# Patient Record
Sex: Female | Born: 1955
Health system: Southern US, Community
[De-identification: ages and names within clinical notes are randomized; demographics above are authoritative.]

## PROBLEM LIST (undated history)

## (undated) DIAGNOSIS — I10 Essential (primary) hypertension: Secondary | ICD-10-CM

## (undated) DIAGNOSIS — G2581 Restless legs syndrome: Secondary | ICD-10-CM

## (undated) DIAGNOSIS — I499 Cardiac arrhythmia, unspecified: Secondary | ICD-10-CM

## (undated) DIAGNOSIS — T7840XA Allergy, unspecified, initial encounter: Secondary | ICD-10-CM

## (undated) DIAGNOSIS — J45909 Unspecified asthma, uncomplicated: Secondary | ICD-10-CM

## (undated) DIAGNOSIS — M1990 Unspecified osteoarthritis, unspecified site: Secondary | ICD-10-CM

## (undated) DIAGNOSIS — E785 Hyperlipidemia, unspecified: Secondary | ICD-10-CM

## (undated) HISTORY — DX: Unspecified osteoarthritis, unspecified site: M19.90

## (undated) HISTORY — DX: Cardiac arrhythmia, unspecified: I49.9

## (undated) HISTORY — DX: Allergy, unspecified, initial encounter: T78.40XA

## (undated) HISTORY — DX: Essential (primary) hypertension: I10

---
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
## 1980-11-13 HISTORY — PX: APPENDECTOMY: SHX54

## 1980-11-13 HISTORY — PX: CHOLECYSTECTOMY: SHX55

## 1990-11-13 HISTORY — PX: TUBAL LIGATION: SHX77

## 2001-11-13 HISTORY — PX: ABDOMINAL HYSTERECTOMY: SHX81

## 2001-11-13 HISTORY — PX: INCONTINENCE SURGERY: SHX676

## 2013-04-10 ENCOUNTER — Emergency Department
Admission: EM | Admit: 2013-04-10 | Discharge: 2013-04-10 | Disposition: A | Discharge status: Home / Self Care | Payer: No Typology Code available for payment source | Source: Home / Self Care | Attending: Family Medicine | Admitting: Family Medicine

## 2013-04-10 ENCOUNTER — Emergency Department (INDEPENDENT_AMBULATORY_CARE_PROVIDER_SITE_OTHER): Payer: No Typology Code available for payment source

## 2013-04-10 ENCOUNTER — Encounter: Payer: Self-pay | Admitting: *Deleted

## 2013-04-10 DIAGNOSIS — M7652 Patellar tendinitis, left knee: Secondary | ICD-10-CM

## 2013-04-10 DIAGNOSIS — M7650 Patellar tendinitis, unspecified knee: Secondary | ICD-10-CM

## 2013-04-10 DIAGNOSIS — M25569 Pain in unspecified knee: Secondary | ICD-10-CM

## 2013-04-10 HISTORY — DX: Unspecified asthma, uncomplicated: J45.909

## 2013-04-10 HISTORY — DX: Restless legs syndrome: G25.81

## 2013-04-10 HISTORY — DX: Hyperlipidemia, unspecified: E78.5

## 2013-04-10 NOTE — ED Notes (Signed)
Pt states hinged knee sleeve feels much better.

## 2013-04-10 NOTE — ED Provider Notes (Signed)
History     CSN: [id number]  Arrival date & time 04/10/13  1645   First MD Initiated Contact with Patient 04/10/13 1718      Chief Complaint  Patient presents with  . Knee Pain       HPI Comments: While doing step-up exercises at the gym at [facility] yesterday patient had sudden pain in her left knee anteriorly.  Later she had mild swelling, and has had persistent pain localized beneath her patella with walking and weight bearing.  Patient is a 57 y.o. female presenting with knee pain. The history is provided by the patient.  Knee Pain Location:  Knee Time since incident:  1 day Injury: yes   Mechanism of injury comment:  Exercising in gym Knee location:  L knee Pain details:    Quality:  Aching   Radiates to:  Does not radiate   Severity:  Mild   Onset quality:  Sudden   Duration:  1 day   Timing:  Constant   Progression:  Unchanged Chronicity:  New Dislocation: no   Prior injury to area:  No Relieved by:  Ice Worsened by:  Nothing tried Ineffective treatments:  Compression Associated symptoms: stiffness and swelling   Associated symptoms: no back pain, no decreased ROM, no muscle weakness, no numbness and no tingling     Past Medical History  Diagnosis Date  . Allergy-induced asthma   . Hyperlipidemia   . RLS (restless legs syndrome)     Past Surgical History  Procedure Laterality Date  . Cholecystectomy    . Appendectomy    . Abdominal hysterectomy      Family History  Problem Relation Age of Onset  . Cancer Mother     lung  . Hypertension Father   . Heart attack Father     History  Substance Use Topics  . Smoking status: Former Games developer  . Smokeless tobacco: Never Used  . Alcohol Use: Yes    OB History   Grav Para Term Preterm Abortions TAB SAB Ect Mult Living                  Review of Systems  Musculoskeletal: Positive for stiffness. Negative for back pain.  All other systems reviewed and are negative.    Allergies  Review of patient's  allergies indicates no known allergies.  Home Medications   Current Outpatient Rx  Name  Route  Sig  Dispense  Refill  . calcium & magnesium carbonates (MYLANTA) 253-664 MG per tablet   Oral   Take 1 tablet by mouth daily.         . cetirizine (ZYRTEC) 10 MG tablet   Oral   Take 10 mg by mouth daily.         . folic acid (FOLVITE) 1 MG tablet   Oral   Take 1 mg by mouth daily.         Marland Kitchen glucosamine-chondroitin 500-400 MG tablet   Oral   Take 1 tablet by mouth 3 (three) times daily.         . Misc Natural Products (DEEP SLEEP PO)   Oral   Take by mouth.         . montelukast (SINGULAIR) 10 MG tablet   Oral   Take 10 mg by mouth at bedtime.         Marland Kitchen rOPINIRole (REQUIP) 1 MG tablet   Oral   Take 1 mg by mouth 3 (three) times daily.         Marland Kitchen  simvastatin (ZOCOR) 40 MG tablet   Oral   Take 40 mg by mouth every evening.         . valACYclovir (VALTREX) 500 MG tablet   Oral   Take 500 mg by mouth 2 (two) times daily.           BP 132/76  Pulse 62  Resp 14  Ht 5\' 2"  (1.575 m)  Wt 155 lb (70.308 kg)  BMI 28.34 kg/m2  SpO2 96%  Physical Exam  Nursing note and vitals reviewed. Constitutional: She is oriented to person, place, and time. She appears well-developed and well-nourished. No distress.  Eyes: Conjunctivae are normal. Pupils are equal, round, and reactive to light.  Musculoskeletal: Normal range of motion. She exhibits tenderness.       Left knee: She exhibits normal range of motion, no swelling, no effusion, no ecchymosis, no deformity, no laceration, no erythema, normal alignment, no LCL laxity, normal patellar mobility, no bony tenderness, normal meniscus and no MCL laxity. Tenderness found. Patellar tendon tenderness noted. No medial joint line, no lateral joint line, no MCL and no LCL tenderness noted.  Left knee has full range of motion.  Patient localizes her pain beneath inferior pole of patella, but there is minimal tenderness to  palpation over patellar tendon.  McMurray test negative.  Knee stable; negative drawer test.  Distal neurovascular function is intact.   Neurological: She is oriented to person, place, and time.  Skin: Skin is warm and dry. No erythema.    ED Course  Procedures  none   Dg Knee Complete 4 Views Left  04/10/2013   *RADIOLOGY REPORT*  Clinical Data: Anterior left knee pain after injury during exercise.  LEFT KNEE - COMPLETE 4+ VIEW  Comparison: None.  Findings: No definite knee effusion.  Subtle subcortical lucency medially along the medial tibial plateau on the frontal view only is thought to likely be incidental.  No knee effusion or fracture identified.  IMPRESSION:  1.  No definite acute bony findings.  If symptoms persist despite conservative therapy, MRI followup may be warranted.   Original Report Authenticated By: Gaylyn Rong, M.D.     1. Patellar tendonitis, left       MDM  Knee brace applied. Apply ice pack for about 30 minutes, three or four times daily.  Continue until improved.  Begin Ibuprofen 200mg , 4 tabs every 8 hours with food.  Begin stretching and range of motion exercises ([facility] information and instruction handout given)  Wear knee brace. Followup with [facility] if not improving about two weeks.         Lattie Haw, MD 04/13/13 [phone]

## 2013-04-10 NOTE — ED Notes (Signed)
Heidi Mclaughlin c/o left knee pain after twisting it at the gym yesterday. No previous injury.

## 2013-04-22 DIAGNOSIS — Z9889 Other specified postprocedural states: Secondary | ICD-10-CM | POA: Insufficient documentation

## 2013-04-24 ENCOUNTER — Ambulatory Visit: Payer: No Typology Code available for payment source | Admitting: Sports Medicine

## 2013-04-24 ENCOUNTER — Encounter: Payer: Self-pay | Admitting: Sports Medicine

## 2013-04-24 ENCOUNTER — Ambulatory Visit (INDEPENDENT_AMBULATORY_CARE_PROVIDER_SITE_OTHER): Payer: No Typology Code available for payment source | Admitting: Sports Medicine

## 2013-04-24 VITALS — BP 138/84 | HR 67 | Wt 153.0 lb

## 2013-04-24 DIAGNOSIS — J4520 Mild intermittent asthma, uncomplicated: Secondary | ICD-10-CM

## 2013-04-24 DIAGNOSIS — Z299 Encounter for prophylactic measures, unspecified: Secondary | ICD-10-CM

## 2013-04-24 DIAGNOSIS — M1710 Unilateral primary osteoarthritis, unspecified knee: Secondary | ICD-10-CM | POA: Insufficient documentation

## 2013-04-24 DIAGNOSIS — M25562 Pain in left knee: Secondary | ICD-10-CM

## 2013-04-24 DIAGNOSIS — M25569 Pain in unspecified knee: Secondary | ICD-10-CM

## 2013-04-24 DIAGNOSIS — J45909 Unspecified asthma, uncomplicated: Secondary | ICD-10-CM

## 2013-04-24 DIAGNOSIS — E785 Hyperlipidemia, unspecified: Secondary | ICD-10-CM

## 2013-04-24 DIAGNOSIS — M1712 Unilateral primary osteoarthritis, left knee: Secondary | ICD-10-CM | POA: Insufficient documentation

## 2013-04-24 DIAGNOSIS — J302 Other seasonal allergic rhinitis: Secondary | ICD-10-CM | POA: Insufficient documentation

## 2013-04-24 DIAGNOSIS — Z0000 Encounter for general adult medical examination without abnormal findings: Secondary | ICD-10-CM | POA: Insufficient documentation

## 2013-04-24 DIAGNOSIS — G2581 Restless legs syndrome: Secondary | ICD-10-CM | POA: Insufficient documentation

## 2013-04-24 MED ORDER — MELOXICAM 15 MG PO TABS: ORAL_TABLET | ORAL | Status: DC

## 2013-04-24 NOTE — Patient Instructions (Addendum)
Hip Rehabilitation Protocol:  1.  Side leg raises.  3x30 with no weight, then 3x15 with 2 lb ankle weight, then 3x15 with 5 lb ankle weight 2.  Standing hip rotation.  3x30 with no weight, then 3x15 with 2 lb ankle weight, then 3x15 with 5 lb ankle weight. 3.  Side step ups.  3x30 with no weight, then 3x15 with 5 lbs in backpack, then 3x15 with 10 lbs in backpack. 

## 2013-04-24 NOTE — Assessment & Plan Note (Signed)
Continue Singulair, Zyrtec 

## 2013-04-24 NOTE — Assessment & Plan Note (Signed)
Checking routine blood work. Colonoscopy was at age 57, mammogram was September 2013, Pap smear was October of 2013.

## 2013-04-24 NOTE — Assessment & Plan Note (Signed)
Home exercises. Mobic. Hip abductor rehabilitation. Return in 4 weeks, injection if no better.

## 2013-04-24 NOTE — Assessment & Plan Note (Signed)
Rechecking lipids. Continue simvastatin.

## 2013-04-24 NOTE — Progress Notes (Signed)
   Subjective:    I'm seeing this patient as a consultation for:  Dr. Cathren Harsh  CC: Left knee pain, establish care.  HPI: Knee pain: About 2-3 weeks ago, was doing lunges, felt a pop in the anterior aspect of knee and developed immediate pain but no swelling, no bruising. Since then she's had pain that she localizes over the proximal aspect of her patella, without radiation, denies any locking or buckling. She does get occasional bright and does note pain is particularly bad in getting up out of a chair and going up and down stairs. Pain is moderate, stable. She was seen in urgent care and referred to me for definitive evaluation and treatment. Pain is not much better since evaluation in urgent care.  Preventative measures: See below, up-to-date on most measures. Due for some blood work.  Past medical history, Surgical history, Family history not pertinant except as noted below, Social history, Allergies, and medications have been entered into the medical record, reviewed, and no changes needed.   Review of Systems: No headache, visual changes, nausea, vomiting, diarrhea, constipation, dizziness, abdominal pain, skin rash, fevers, chills, night sweats, weight loss, swollen lymph nodes, body aches, joint swelling, muscle aches, chest pain, shortness of breath, mood changes, visual or auditory hallucinations.   Objective:   General: Well Developed, well nourished, and in no acute distress.  Neuro/Psych: Alert and oriented x3, extra-ocular muscles intact, able to move all 4 extremities, sensation grossly intact. Skin: Warm and dry, no rashes noted.  Respiratory: Not using accessory muscles, speaking in full sentences, trachea midline.  Cardiovascular: Pulses palpable, no extremity edema. Abdomen: Does not appear distended. Left Knee: Normal to inspection with no erythema or effusion or obvious bony abnormalities. Palpation normal with no warmth, joint line tenderness, patellar tenderness, or  condyle tenderness. She does have a palpable plica lateral to the patella with a non-painful pop as we take it through the range of motion. ROM full in flexion and extension and lower leg rotation. Ligaments with solid consistent endpoints including ACL, PCL, LCL, MCL. Negative Mcmurray's, Apley's, and Thessalonian tests. Non painful patellar compression. Patellar glide without crepitus. Patellar and quadriceps tendons unremarkable. Hamstring and quadriceps strength is normal.   X-rays reviewed and showed no sign of fracture, dislocation, or degenerative change.  Impression and Recommendations:   This case required medical decision making of moderate complexity.

## 2013-04-24 NOTE — Assessment & Plan Note (Signed)
Controlled with [employer].

## 2013-04-25 ENCOUNTER — Ambulatory Visit (INDEPENDENT_AMBULATORY_CARE_PROVIDER_SITE_OTHER): Payer: No Typology Code available for payment source | Admitting: Sports Medicine

## 2013-04-25 ENCOUNTER — Encounter: Payer: Self-pay | Admitting: Sports Medicine

## 2013-04-25 VITALS — BP 118/77 | HR 71

## 2013-04-25 DIAGNOSIS — M25562 Pain in left knee: Secondary | ICD-10-CM

## 2013-04-25 DIAGNOSIS — M25569 Pain in unspecified knee: Secondary | ICD-10-CM

## 2013-04-25 NOTE — Assessment & Plan Note (Signed)
I saw this pleasant 57 year old female recently for patellofemoral pain.  Unfortunately she called in extreme pain, but she localized under the kneecap.  Meloxicam was ineffective, we injected as above. Return in 4 weeks.

## 2013-04-25 NOTE — Progress Notes (Signed)
  Subjective:    CC: Followup  HPI: Left knee pain: Diagnosed with patellofemoral pain syndrome at the last visit, a few days ago. Unfortunately had a recent flare of her pain, no injury, no trauma, no swelling, no mechanical symptoms. Pain is localized in the patella, moderate to severe but much better than yesterday, stable.  Past medical history, Surgical history, Family history not pertinant except as noted below, Social history, Allergies, and medications have been entered into the medical record, reviewed, and no changes needed.   Review of Systems: No fevers, chills, night sweats, weight loss, chest pain, or shortness of breath.   Objective:    General: Well Developed, well nourished, and in no acute distress.  Neuro: Alert and oriented x3, extra-ocular muscles intact, sensation grossly intact.  HEENT: Normocephalic, atraumatic, pupils equal round reactive to light, neck supple, no masses, no lymphadenopathy, thyroid nonpalpable.  Skin: Warm and dry, no rashes. Cardiac: Regular rate and rhythm, no murmurs rubs or gallops, no lower extremity edema.  Respiratory: Clear to auscultation bilaterally. Not using accessory muscles, speaking in full sentences. Left Knee: Normal to inspection with no erythema or effusion or obvious bony abnormalities. Palpation normal with no warmth, joint line tenderness, patellar tenderness, or condyle tenderness. ROM full in flexion and extension and lower leg rotation. Ligaments with solid consistent endpoints including ACL, PCL, LCL, MCL. Negative Mcmurray's, Apley's, and Thessalonian tests. Non painful patellar compression. Patellar glide without crepitus. Patellar and quadriceps tendons unremarkable. Hamstring and quadriceps strength is normal.   Procedure: Real-time Ultrasound Guided Injection of left knee Device: GE Logiq E  Verbal informed consent obtained.  Time-out conducted.  Noted no overlying erythema, induration, or other signs of local  infection.  Skin prepped in a sterile fashion.  Local anesthesia: Topical Ethyl chloride.  With sterile technique and under real time ultrasound guidance:  2 cc kenalog 40, 4 cc lidocaine injected into suprapatellar recess. Completed without difficulty  Pain immediately resolved suggesting accurate placement of the medication.  Advised to call if fevers/chills, erythema, induration, drainage, or persistent bleeding.  Images permanently stored and available for review in the ultrasound unit.  Impression: Technically successful ultrasound guided injection.  Impression and Recommendations:

## 2013-04-29 ENCOUNTER — Ambulatory Visit: Payer: No Typology Code available for payment source | Admitting: Sports Medicine

## 2013-05-05 ENCOUNTER — Encounter: Payer: Self-pay | Admitting: Sports Medicine

## 2013-05-05 DIAGNOSIS — Z299 Encounter for prophylactic measures, unspecified: Secondary | ICD-10-CM

## 2013-05-05 NOTE — Progress Notes (Signed)
Records received, reviewed, and necessary changes to chart made.  

## 2013-05-23 ENCOUNTER — Ambulatory Visit (INDEPENDENT_AMBULATORY_CARE_PROVIDER_SITE_OTHER): Payer: No Typology Code available for payment source | Admitting: Sports Medicine

## 2013-05-23 ENCOUNTER — Encounter: Payer: Self-pay | Admitting: Sports Medicine

## 2013-05-23 VITALS — BP 127/84 | HR 67 | Wt 153.0 lb

## 2013-05-23 DIAGNOSIS — M25562 Pain in left knee: Secondary | ICD-10-CM

## 2013-05-23 DIAGNOSIS — E785 Hyperlipidemia, unspecified: Secondary | ICD-10-CM

## 2013-05-23 DIAGNOSIS — Z299 Encounter for prophylactic measures, unspecified: Secondary | ICD-10-CM

## 2013-05-23 DIAGNOSIS — G2581 Restless legs syndrome: Secondary | ICD-10-CM

## 2013-05-23 NOTE — Assessment & Plan Note (Signed)
Pain resolved after single intra-articular injection. Continue knee brace as needed, continue home exercises. Return on an as-needed basis for this.

## 2013-05-23 NOTE — Progress Notes (Signed)
  Subjective:    CC: Followup  HPI: Patellofemoral chondromalacia: Pain is 100% resolved after injection, she continues to use her brace, is very happy with the results. She continues to do home exercises.  Restless leg syndrome: Stable.  Hyperlipidemia: Stable.  Past medical history, Surgical history, Family history not pertinant except as noted below, Social history, Allergies, and medications have been entered into the medical record, reviewed, and no changes needed.   Review of Systems: No fevers, chills, night sweats, weight loss, chest pain, or shortness of breath.   Objective:    General: Well Developed, well nourished, and in no acute distress.  Neuro: Alert and oriented x3, extra-ocular muscles intact, sensation grossly intact.  HEENT: Normocephalic, atraumatic, pupils equal round reactive to light, neck supple, no masses, no lymphadenopathy, thyroid nonpalpable.  Skin: Warm and dry, no rashes. Cardiac: Regular rate and rhythm, no murmurs rubs or gallops, no lower extremity edema.  Respiratory: Clear to auscultation bilaterally. Not using accessory muscles, speaking in full sentences. Left Knee: Normal to inspection with no erythema or effusion or obvious bony abnormalities. Palpation normal with no warmth, joint line tenderness, patellar tenderness, or condyle tenderness. ROM full in flexion and extension and lower leg rotation. Ligaments with solid consistent endpoints including ACL, PCL, LCL, MCL. Negative Mcmurray's, Apley's, and Thessalonian tests. Non painful patellar compression. Patellar glide without crepitus. Patellar and quadriceps tendons unremarkable. Hamstring and quadriceps strength is normal.  Impression and Recommendations:

## 2013-05-23 NOTE — Assessment & Plan Note (Signed)
Continue Zocor, no changes. 

## 2013-05-23 NOTE — Assessment & Plan Note (Signed)
Up-to-date on colonoscopy and Pap smear.

## 2013-05-23 NOTE — Assessment & Plan Note (Signed)
Stable, no changes  

## 2013-06-03 ENCOUNTER — Ambulatory Visit (HOSPITAL_BASED_OUTPATIENT_CLINIC_OR_DEPARTMENT_OTHER)
Admission: RE | Admit: 2013-06-03 | Discharge: 2013-06-03 | Disposition: A | Discharge status: Home / Self Care | Payer: No Typology Code available for payment source | Source: Ambulatory Visit | Attending: Sports Medicine | Admitting: Sports Medicine

## 2013-06-03 ENCOUNTER — Encounter: Payer: Self-pay | Admitting: Sports Medicine

## 2013-06-03 ENCOUNTER — Telehealth: Payer: Self-pay | Admitting: *Deleted

## 2013-06-03 ENCOUNTER — Ambulatory Visit (INDEPENDENT_AMBULATORY_CARE_PROVIDER_SITE_OTHER): Payer: No Typology Code available for payment source | Admitting: Sports Medicine

## 2013-06-03 VITALS — BP 139/84 | HR 63 | Wt 152.0 lb

## 2013-06-03 DIAGNOSIS — M25562 Pain in left knee: Secondary | ICD-10-CM

## 2013-06-03 DIAGNOSIS — M25569 Pain in unspecified knee: Secondary | ICD-10-CM

## 2013-06-03 DIAGNOSIS — M25469 Effusion, unspecified knee: Secondary | ICD-10-CM | POA: Insufficient documentation

## 2013-06-03 DIAGNOSIS — M2390 Unspecified internal derangement of unspecified knee: Secondary | ICD-10-CM | POA: Insufficient documentation

## 2013-06-03 NOTE — Telephone Encounter (Signed)
Prior auth obtained for MRI left knee through [employer].  Auth good from 7/23-8/23 & auth # is [id number].

## 2013-06-03 NOTE — Progress Notes (Signed)
  Subjective:    CC: Left knee pain  HPI: This is a very pleasant 57 year old female, I injected her left knee approximately a month ago, she was doing fairly well, she did have to dance the wedding, and now has significant pain which she localizes the posterior lateral joint line, with associated popping. No buckling. Pain is moderate, persistent.  Past medical history, Surgical history, Family history not pertinant except as noted below, Social history, Allergies, and medications have been entered into the medical record, reviewed, and no changes needed.   Review of Systems: No fevers, chills, night sweats, weight loss, chest pain, or shortness of breath.   Objective:    General: Well Developed, well nourished, and in no acute distress.  Neuro: Alert and oriented x3, extra-ocular muscles intact, sensation grossly intact.  HEENT: Normocephalic, atraumatic, pupils equal round reactive to light, neck supple, no masses, no lymphadenopathy, thyroid nonpalpable.  Skin: Warm and dry, no rashes. Cardiac: Regular rate and rhythm, no murmurs rubs or gallops, no lower extremity edema.  Respiratory: Clear to auscultation bilaterally. Not using accessory muscles, speaking in full sentences. Left Knee: Normal to inspection with no erythema or effusion or obvious bony abnormalities. Tender to palpation at the posterior medial joint line. ROM full in flexion and extension and lower leg rotation. Ligaments with solid consistent endpoints including ACL, PCL, LCL, MCL. Positive McMurray's sign. Non painful patellar compression. Patellar glide without crepitus. Patellar and quadriceps tendons unremarkable. Hamstring and quadriceps strength is normal.   Procedure:  Injection of left knee Consent obtained and verified. Time-out conducted. Noted no overlying erythema, induration, or other signs of local infection. Skin prepped in a sterile fashion. Topical analgesic spray: Ethyl chloride. Completed  without difficulty. Meds: 25-gauge needle advanced just inferior and medial to the inferior pole of the patella, advanced into joint, 2 cc Kenalog 40, 4 cc lidocaine injected easily. Pain immediately improved suggesting accurate placement of the medication. Advised to call if fevers/chills, erythema, induration, drainage, or persistent bleeding.  Impression and Recommendations:

## 2013-06-03 NOTE — Assessment & Plan Note (Signed)
Recurrent left knee pain, reinjected. She does have a positive McMurray's which likely represents a degenerative meniscal tear, I am going to obtain an MRI. To see her back for MRI results, she will likely proceed to arthroscopic intervention.

## 2013-06-05 ENCOUNTER — Encounter: Payer: Self-pay | Admitting: Sports Medicine

## 2013-06-05 ENCOUNTER — Ambulatory Visit (INDEPENDENT_AMBULATORY_CARE_PROVIDER_SITE_OTHER): Payer: No Typology Code available for payment source | Admitting: Sports Medicine

## 2013-06-05 VITALS — BP 143/81 | HR 60 | Wt 153.0 lb

## 2013-06-05 DIAGNOSIS — IMO0002 Reserved for concepts with insufficient information to code with codable children: Secondary | ICD-10-CM

## 2013-06-05 DIAGNOSIS — M1710 Unilateral primary osteoarthritis, unspecified knee: Secondary | ICD-10-CM

## 2013-06-05 DIAGNOSIS — M1712 Unilateral primary osteoarthritis, left knee: Secondary | ICD-10-CM

## 2013-06-05 NOTE — Assessment & Plan Note (Signed)
Heidi Mclaughlin has already had a couple of steroid injections, the first one lasted for several months, the last one was done recently, and she continues to have some pain. An MRI was obtained and shows fairly moderate degenerative changes in the knee but no meniscal tears. She does not desire to pursue any further interventional treatment, we did discuss Visco supplementation but she declines, and she does desire definitive surgical treatment, I think this is appropriate. I am going to refer her to Dr. Jodi Geralds, for consideration of total knee arthroplasty versus simple arthroscopic chondroplasty.

## 2013-06-05 NOTE — Progress Notes (Signed)
  Subjective:    CC: Followup  HPI: Left knee pain: This is a very pleasant 57 year old female, she's had a couple of injections in the past, first worked very well, the most recent one was done a couple of days ago and she has persistent pain. We also obtained an MRI the results of which be dictated below. Pain is localized in the medial and lateral joint line, she gets occasional clicking and popping but no buckling or locking. She does get swelling. Symptoms are moderate, persistent. They interfered with her daily life, physical exercise, and she desires definitive management. She does not want any further interventional treatment.  Past medical history, Surgical history, Family history not pertinant except as noted below, Social history, Allergies, and medications have been entered into the medical record, reviewed, and no changes needed.   Review of Systems: No fevers, chills, night sweats, weight loss, chest pain, or shortness of breath.   Objective:    General: Well Developed, well nourished, and in no acute distress.  Neuro: Alert and oriented x3, extra-ocular muscles intact, sensation grossly intact.  HEENT: Normocephalic, atraumatic, pupils equal round reactive to light, neck supple, no masses, no lymphadenopathy, thyroid nonpalpable.  Skin: Warm and dry, no rashes. Cardiac: Regular rate and rhythm, no murmurs rubs or gallops, no lower extremity edema.  Respiratory: Clear to auscultation bilaterally. Not using accessory muscles, speaking in full sentences. Left Knee: Visible and palpable mild effusion, tender to palpation at the medial and lateral joint lines. ROM full in flexion and extension and lower leg rotation. Ligaments with solid consistent endpoints including ACL, PCL, LCL, MCL. Negative Mcmurray's, Apley's, and Thessalonian tests. Non painful patellar compression. Patellar glide without crepitus. Patellar and quadriceps tendons unremarkable. Hamstring and quadriceps  strength is normal.   MRI was reviewed and shows bicompartmental DJD, effusion, but no meniscal tears.  Impression and Recommendations:

## 2013-06-07 ENCOUNTER — Other Ambulatory Visit (HOSPITAL_BASED_OUTPATIENT_CLINIC_OR_DEPARTMENT_OTHER): Payer: No Typology Code available for payment source

## 2013-07-01 ENCOUNTER — Ambulatory Visit (INDEPENDENT_AMBULATORY_CARE_PROVIDER_SITE_OTHER): Payer: No Typology Code available for payment source | Admitting: Family Medicine

## 2013-07-01 ENCOUNTER — Encounter: Payer: Self-pay | Admitting: Family Medicine

## 2013-07-01 VITALS — BP 122/71 | HR 70 | Wt 151.0 lb

## 2013-07-01 DIAGNOSIS — R51 Headache: Secondary | ICD-10-CM

## 2013-07-01 MED ORDER — AMOXICILLIN-POT CLAVULANATE 500-125 MG PO TABS: ORAL_TABLET | ORAL | Status: AC

## 2013-07-01 NOTE — Progress Notes (Signed)
CC: Heidi Mclaughlin is a 57 y.o. female is here for Headache   Subjective: HPI:  Patient complains of 5 days of headache that is localized to the face just above and below the eyes that is mild severity but will become moderate severity when lifting weights and will be accompanied by radiation into the back of the head. This has come on around that same time as nasal congestion and discharge along with postnasal drip has worsened over the last week. During before or after the headache she denies any motor or sensory disturbances, double vision, hearing loss, confusion, photophobia, nor dizziness. Symptoms predictably subside within 5 minutes after resting. She's never had this before. Symptoms are not reproduced with any other positional maneuvers or activity. No interventions as of yet other than continuing Singulair and Zyrtec, she claims August as always the worst time for seasonal allergic sinusitis.  She denies fevers, chills, cough, shortness of breath, wheezing, nor chest pain or irregular heartbeat   Review Of Systems Outlined In HPI  Past Medical History  Diagnosis Date  . Allergy-induced asthma   . Hyperlipidemia   . RLS (restless legs syndrome)      Family History  Problem Relation Age of Onset  . Cancer Mother     lung  . Hypertension Father   . Heart attack Father      History  Substance Use Topics  . Smoking status: Former Games developer  . Smokeless tobacco: Never Used  . Alcohol Use: Yes     Objective: Filed Vitals:   07/01/13 1126  BP: 122/71  Pulse: 70    General: Alert and Oriented, No Acute Distress HEENT: Pupils equal, round, reactive to light. Conjunctivae clear.  External ears unremarkable, canals clear with intact TMs with appropriate landmarks.  Middle ear appears open without effusion. Boggy erythematous inferior turbinates with moderate mucoid discharge.  Moist mucous membranes, pharynx without inflammation nor lesions however moderate cobblestoning.  Neck  supple without palpable lymphadenopathy nor abnormal masses. Frontal sinus tenderness to percussion bilaterally Lungs: Clear to auscultation bilaterally, no wheezing/ronchi/rales.  Comfortable work of breathing. Good air movement. Cardiac: Regular rate and rhythm. Normal S1/S2.  No murmurs, rubs, nor gallops.   Neuro: CN II-XII grossly intact, full strength/rom of all four extremities, C5/L4/S1 DTRs 2/4 bilaterally, gait normal, rapid alternating movements normal Extremities: No peripheral edema.  Strong peripheral pulses.  Mental Status: No depression, anxiety, nor agitation. Skin: Warm and dry.  Assessment & Plan: Heidi Mclaughlin was seen today for headache.  Diagnoses and associated orders for this visit:  Headache(784.0) - amoxicillin-clavulanate (AUGMENTIN) 500-125 MG per tablet; Take one by mouth every 8 hours for ten total days.    Discussed with patient most likely suffering from bacterial sinusitis however encouraged her to cut back on her physical activity over the next 3-5 days while taking antibiotic above, if symptoms are reoccurring to any degree would have to rule out vascular malformation or brain mass with MRI.Signs and symptoms requring emergent/urgent reevaluation were discussed with the patient. Call me if symptoms persist into the latter part of this week .Regardless have asked her to followup with her PCP next week.  Return in about 1 week (around 07/08/2013) for Dr. Karie Schwalbe F/U Headahce.

## 2013-07-07 ENCOUNTER — Ambulatory Visit (INDEPENDENT_AMBULATORY_CARE_PROVIDER_SITE_OTHER): Payer: No Typology Code available for payment source | Admitting: Family Medicine

## 2013-07-07 ENCOUNTER — Encounter: Payer: Self-pay | Admitting: Family Medicine

## 2013-07-07 DIAGNOSIS — M79609 Pain in unspecified limb: Secondary | ICD-10-CM

## 2013-07-07 DIAGNOSIS — M2540 Effusion, unspecified joint: Secondary | ICD-10-CM

## 2013-07-07 NOTE — Progress Notes (Signed)
CC: Heidi Mclaughlin is a 57 y.o. female is here for hands swollen   Subjective: HPI:  Patient complains of joint pain and fatigue that began this weekend on Saturday. It came on abruptly both of which were severe yesterday they have waxed and waned since onset on average mild to moderate severity. Pain is localized to both hands involving every joint in the hand along with wrist pain is described as a dull ache that radiates proximally. She's also had some hard to define bilateral ankle pain since the onset above. She's had had mild swelling in both hands and ankles to the point where she's having trouble getting jewelry on.  Fatigue was to the point where she spent most of her day resting yesterday. She tells me her pain does not feel like muscle soreness that she is use to with working out. She denies known tick bites, fevers, chills, unintentional weight loss, rashes, motor or sensory disturbances, new medications or joint redness or warmth. No family history of thyroid abnormality nor autoimmune arthritis. Symptoms are slightly improved with meloxicam  Review Of Systems Outlined In HPI  Past Medical History  Diagnosis Date  . Allergy-induced asthma   . Hyperlipidemia   . RLS (restless legs syndrome)      Family History  Problem Relation Age of Onset  . Cancer Mother     lung  . Hypertension Father   . Heart attack Father      History  Substance Use Topics  . Smoking status: Former Games developer  . Smokeless tobacco: Never Used  . Alcohol Use: Yes     Objective: Filed Vitals:   07/07/13 1426  BP: 130/77  Pulse: 66  Temp: 98.3 F (36.8 C)    General: Alert and Oriented, No Acute Distress HEENT: Pupils equal, round, reactive to light. Conjunctivae clear.  Moist mucous membranes pharynx remarkable Lungs: Clear to auscultation bilaterally, no wheezing/ronchi/rales.  Comfortable work of breathing. Good air movement. Cardiac: Regular rate and rhythm. Normal S1/S2.  No murmurs, rubs,  nor gallops.   Extremities: Trace nonpitting peripheral edema in the hands and ankles this is symmetric.  Strong peripheral pulses. No pain in anatomical snuff box Finklestein's negative bilaterally. Full range of motion and strength of the upper extremities and hands. No redness warmth or gross swelling in either hand other than trace edema. Mental Status: No depression, anxiety, nor agitation. Skin: Warm and dry. No rashes on the torso or upper extremities  Assessment & Plan: [name] was seen today for hands swollen.  Diagnoses and associated orders for this visit:  Hand pain, unspecified laterality - Antinuclear Antib (ANA) - Cyclic citrul peptide antibody, IgG - Rheumatoid Factor - CBC w/Diff - TSH  Joint swelling - Antinuclear Antib (ANA) - Cyclic citrul peptide antibody, IgG - Rheumatoid Factor - CBC w/Diff - TSH    Bilateral hand pain: Given the symmetry and distribution I feel it wise to rule out autoimmune arthritis with the above labs, we'll also screen for hypothyroidism. Patient declines offer for prednisone or anti-inflammatory beyond meloxicam that she is already taking. We will update her with the plan as labs return  Return if symptoms worsen or fail to improve.

## 2013-07-08 ENCOUNTER — Emergency Department
Admission: EM | Admit: 2013-07-08 | Discharge: 2013-07-08 | Disposition: A | Discharge status: Home / Self Care | Payer: No Typology Code available for payment source | Source: Home / Self Care | Attending: Emergency Medicine | Admitting: Emergency Medicine

## 2013-07-08 ENCOUNTER — Encounter: Payer: Self-pay | Admitting: *Deleted

## 2013-07-08 ENCOUNTER — Telehealth: Payer: Self-pay | Admitting: *Deleted

## 2013-07-08 DIAGNOSIS — T7840XA Allergy, unspecified, initial encounter: Secondary | ICD-10-CM

## 2013-07-08 LAB — CBC WITH DIFFERENTIAL/PLATELET
Basophils Absolute: 0.1 10*3/uL (ref 0.0–0.1)
Eosinophils Absolute: 0.1 10*3/uL (ref 0.0–0.7)
Eosinophils Relative: 2 % (ref 0–5)
HCT: 38.5 % (ref 36.0–46.0)
Lymphocytes Relative: 24 % (ref 12–46)
MCH: 30.5 pg (ref 26.0–34.0)
MCV: 87.5 fL (ref 78.0–100.0)
Monocytes Absolute: 0.3 10*3/uL (ref 0.1–1.0)
RDW: 13.5 % (ref 11.5–15.5)
WBC: 4.7 10*3/uL (ref 4.0–10.5)

## 2013-07-08 MED ORDER — DIPHENHYDRAMINE HCL 50 MG/ML IJ SOLN
25.0000 mg | Freq: Once | INTRAMUSCULAR | Status: AC
  Administered 2013-07-08: 25 mg via INTRAMUSCULAR

## 2013-07-08 MED ORDER — METHYLPREDNISOLONE ACETATE 40 MG/ML IJ SUSP
80.0000 mg | Freq: Once | INTRAMUSCULAR | Status: AC
  Administered 2013-07-08: 80 mg via INTRAMUSCULAR

## 2013-07-08 MED ORDER — HYDROXYZINE HCL 25 MG PO TABS: ORAL_TABLET | ORAL | Status: DC

## 2013-07-08 MED ORDER — PREDNISONE (PAK) 10 MG PO TABS: ORAL_TABLET | ORAL | Status: DC

## 2013-07-08 NOTE — Telephone Encounter (Signed)
Pt called back & stated that she feels like she's "smothering".  I advised her to take another dose of benadryl & to go to her local ED.  She stated that she was going right now.  FYI

## 2013-07-08 NOTE — Telephone Encounter (Signed)
Pt calls & states that she is swollen so much worse now than she was when you saw her last.  She states that she is itching from head to toe.  She is wondering if she is reacting to the augmentin.  She states that her feet are so swollen that she's not even able to put her shoes on.

## 2013-07-08 NOTE — ED Provider Notes (Signed)
CSN: [id number]     Arrival date & time 07/08/13  0935 History   First MD Initiated Contact with Patient 07/08/13 [phone]     Chief Complaint  Patient presents with  . Facial Swelling  . Dizziness    The history is provided by the patient.  Kameisha c/o edema to hands and ankles/feet x yesterday. With severe diffuse itching. She's been taking Augmentin x1 week for sinus infection, and the sinus infection symptoms have resolved.  She was seen in primary care yesterday for evaluation. Various blood work done and pending. Yesterday, CBC, rheumatoid factor, TSH was normal.  This AM she awoke with worsened diffuse edema, itching, dizziness and the feeling she is being "smothered" @ her throat. Denies CP. Denies wheezing. Took 2 Benadryl at 3 AM which may have helped a little. Last dose of Augmentin was yesterday. She denies any other allergens to her knowledge. Prior to the prescription for Augmentin 6 days ago, she had no known drug allergies.  Denies fever, chills. Complains of minimal nausea without vomiting. No abdominal pain.   Past Medical History  Diagnosis Date  . Allergy-induced asthma   . Hyperlipidemia   . RLS (restless legs syndrome)    Past Surgical History  Procedure Laterality Date  . Cholecystectomy    . Appendectomy    . Abdominal hysterectomy     Family History  Problem Relation Age of Onset  . Cancer Mother     lung  . Hypertension Father   . Heart attack Father    History  Substance Use Topics  . Smoking status: Former Games developer  . Smokeless tobacco: Never Used  . Alcohol Use: Yes   OB History   Grav Para Term Preterm Abortions TAB SAB Ect Mult Living                 Review of Systems  Constitutional: Negative for fever and chills.       She feels flushed in her face  HENT: Positive for trouble swallowing (Globus sensation). Negative for ear pain, nosebleeds, congestion, rhinorrhea and ear discharge.   Eyes: Negative for pain.  Respiratory: Negative for  chest tightness, shortness of breath and wheezing.   Cardiovascular: Negative.  Negative for chest pain and palpitations.  Gastrointestinal: Positive for nausea (Minimal). Negative for vomiting, abdominal pain and blood in stool.  Genitourinary: Negative.   Skin: Negative.  Negative for rash.  Neurological: Positive for dizziness and light-headedness. Negative for seizures.       Denies vertigo. No focal weakness or paresthesias  Psychiatric/Behavioral: Negative for hallucinations and confusion.  All other systems reviewed and are negative.    Allergies  Poison ivy extract  Home Medications   Current Outpatient Rx  Name  Route  Sig  Dispense  Refill  . amoxicillin-clavulanate (AUGMENTIN) 500-125 MG per tablet      Take one by mouth every 8 hours for ten total days.   30 tablet   0   . calcium & magnesium carbonates (MYLANTA) 119-147 MG per tablet   Oral   Take 1 tablet by mouth daily.         . cetirizine (ZYRTEC) 10 MG tablet   Oral   Take 10 mg by mouth daily.         . folic acid (FOLVITE) 1 MG tablet   Oral   Take 1 mg by mouth daily.         Marland Kitchen glucosamine-chondroitin 500-400 MG tablet   Oral  Take 1 tablet by mouth 3 (three) times daily.         . hydrOXYzine (ATARAX/VISTARIL) 25 MG tablet      Take 1 every 4-6 hours as needed for itch. (Caution: May cause drowsiness)   15 tablet   0   . meloxicam (MOBIC) 15 MG tablet      One tab PO qAM with breakfast for 2 weeks, then daily prn pain.   30 tablet   3   . Misc Natural Products (DEEP SLEEP PO)   Oral   Take by mouth.         . montelukast (SINGULAIR) 10 MG tablet   Oral   Take 10 mg by mouth at bedtime.         . predniSONE (STERAPRED UNI-PAK) 10 MG tablet      Take as directed for 6 days.--Take 6 on day one, 5 on day 2, 4 on day 3, then 3 tablets on day 4, then 2 tablets on day 5, then 1 tablet on day 6.   21 tablet   0   . rOPINIRole (REQUIP) 1 MG tablet   Oral   Take 1 mg by  mouth 3 (three) times daily.         . simvastatin (ZOCOR) 40 MG tablet   Oral   Take 40 mg by mouth every evening.         . valACYclovir (VALTREX) 500 MG tablet   Oral   Take 500 mg by mouth as needed.           BP 139/79  Pulse 68  Temp(Src) 98.1 F (36.7 C) (Oral)  Resp 18  Ht 5\' 2"  (1.575 m)  Wt 151 lb (68.493 kg)  BMI 27.61 kg/m2  SpO2 98% Physical Exam  Nursing note and vitals reviewed. Constitutional: She is oriented to person, place, and time. She appears well-developed and well-nourished. No distress.  Mildly anxious, very uncomfortable from edema and globus sensation and itching. No acute cardiorespiratory distress  HENT:  Head: Normocephalic and atraumatic.  Right Ear: External ear normal.  Left Ear: External ear normal.  Nose: Nose normal.  Mouth/Throat: Oropharynx is clear and moist and mucous membranes are normal. No oropharyngeal exudate or posterior oropharyngeal edema.  Airway intact  Eyes: Conjunctivae are normal. Pupils are equal, round, and reactive to light. No scleral icterus.  Neck: Normal range of motion. Neck supple. No tracheal deviation present.  Cardiovascular: Regular rhythm and normal heart sounds.  Exam reveals no gallop and no friction rub.   No murmur heard. Pulmonary/Chest: Effort normal and breath sounds normal. No respiratory distress. She has no wheezes. She has no rales.  Abdominal: Soft. She exhibits no mass. There is no tenderness. There is no guarding.  Musculoskeletal: Normal range of motion. She exhibits edema (Mild diffuse edema of hands and feet, trace pretibial edema bilaterally. No cords or calf tenderness. Homans negative). She exhibits no tenderness.  No hot swollen joints  Lymphadenopathy:    She has no cervical adenopathy.  Neurological: She is alert and oriented to person, place, and time. She has normal strength and normal reflexes. No cranial nerve deficit or sensory deficit. She displays a negative Romberg sign.   Skin: Skin is warm and dry. No rash noted.  Face is red and flushed, especially cheeks. Mild lip swelling.  Psychiatric: Her speech is normal. Her mood appears anxious (mildly). She is not actively hallucinating.    ED Course  Procedures (including critical care  time) Labs Review Labs Reviewed - No data to display Imaging Review No results found.  MDM   1. Allergic reaction, initial encounter    Assessment: By history and exam, she has some type of diffuse allergic reaction. Also, an element of anxiety/globus sensation. Initial treatment in urgent care: Benadryl 25 mg IM stat.--She was reevaluated 15 minutes later with stable vital signs. She felt significantly improved, with much less facial flushing and less itching and less globus sensation. 30 minutes later, the above symptoms were significantly improving. Other treatment: Depo-Medrol 80 mg IM. She tolerated this well. Rx: Hydroxyzine 25 mg by mouth every 4-6 hours when necessary itch. Precautions discussed. Rx: Prednisone 10 mg-6 day Dosepak. Precautions discussed. Other symptomatic care discussed. Push fluids. Red flags discussed. Husband came to drive her home. Patient and husband voiced understanding and agreement .    Lajean Manes, MD 07/08/13 1049

## 2013-07-08 NOTE — ED Notes (Signed)
Heidi Mclaughlin c/o edema to hands and ankles/feet x yesterday. She was seen in primary care yesterday who determined it's possible she is having a rxn to the Augmentin she had been taking for 6 days or Lyme's, blood work done and pending. This AM she awoke with worsened edema, itching, dizziness and the feeling she is being "smothered" @ her throat. Denies CP.

## 2013-07-29 ENCOUNTER — Encounter: Payer: Self-pay | Admitting: Sports Medicine

## 2013-07-29 ENCOUNTER — Ambulatory Visit (INDEPENDENT_AMBULATORY_CARE_PROVIDER_SITE_OTHER): Payer: No Typology Code available for payment source | Admitting: Sports Medicine

## 2013-07-29 VITALS — BP 142/81 | HR 68 | Wt 153.0 lb

## 2013-07-29 DIAGNOSIS — J019 Acute sinusitis, unspecified: Secondary | ICD-10-CM | POA: Insufficient documentation

## 2013-07-29 DIAGNOSIS — B349 Viral infection, unspecified: Secondary | ICD-10-CM

## 2013-07-29 DIAGNOSIS — B9789 Other viral agents as the cause of diseases classified elsewhere: Secondary | ICD-10-CM

## 2013-07-29 MED ORDER — HYDROCOD POLST-CHLORPHEN POLST 10-8 MG/5ML PO LQCR: 5.0000 mL | Freq: Two times a day (BID) | ORAL | Status: DC | PRN

## 2013-07-29 NOTE — Progress Notes (Signed)
  Subjective:    CC: Sick  HPI: This pleasant 57 year old female has had several days of cough, muscle aches, body aches, no GI symptoms, no rash. No fevers. No chills. Cough is productive of yellowish sputum. She overall feels better, but wants me to check her out. No nasal discharge, mild headache, no sinus pressure. Symptoms are moderate, persistent.  Past medical history, Surgical history, Family history not pertinant except as noted below, Social history, Allergies, and medications have been entered into the medical record, reviewed, and no changes needed.   Review of Systems: No fevers, chills, night sweats, weight loss, chest pain, or shortness of breath.   Objective:    General: Well Developed, well nourished, and in no acute distress.  Neuro: Alert and oriented x3, extra-ocular muscles intact, sensation grossly intact.  HEENT: Normocephalic, atraumatic, pupils equal round reactive to light, neck supple, no masses, no lymphadenopathy, thyroid nonpalpable. Oropharynx and nasopharynx are unremarkable, external ear canals are unremarkable, no tenderness to palpation or percussion over the maxillary or frontal sinuses. Skin: Warm and dry, no rashes. Cardiac: Regular rate and rhythm, no murmurs rubs or gallops, no lower extremity edema.  Respiratory: Clear to auscultation bilaterally. Not using accessory muscles, speaking in full sentences.  Impression and Recommendations:

## 2013-07-29 NOTE — Assessment & Plan Note (Signed)
Predominately with a cough, muscle aches, body aches. Only present now for about 5 days. Tussionex at night. She will use Motrin during the day, and return to see me if no better in about a week.

## 2013-08-07 ENCOUNTER — Ambulatory Visit (INDEPENDENT_AMBULATORY_CARE_PROVIDER_SITE_OTHER): Payer: No Typology Code available for payment source | Admitting: Sports Medicine

## 2013-08-07 ENCOUNTER — Encounter: Payer: Self-pay | Admitting: Sports Medicine

## 2013-08-07 VITALS — BP 117/71 | HR 72 | Wt 152.0 lb

## 2013-08-07 DIAGNOSIS — B349 Viral infection, unspecified: Secondary | ICD-10-CM

## 2013-08-07 DIAGNOSIS — M4316 Spondylolisthesis, lumbar region: Secondary | ICD-10-CM | POA: Insufficient documentation

## 2013-08-07 DIAGNOSIS — M5416 Radiculopathy, lumbar region: Secondary | ICD-10-CM

## 2013-08-07 DIAGNOSIS — IMO0002 Reserved for concepts with insufficient information to code with codable children: Secondary | ICD-10-CM

## 2013-08-07 DIAGNOSIS — B9789 Other viral agents as the cause of diseases classified elsewhere: Secondary | ICD-10-CM

## 2013-08-07 MED ORDER — AZITHROMYCIN 250 MG PO TABS: ORAL_TABLET | ORAL | Status: DC

## 2013-08-07 MED ORDER — FLUTICASONE PROPIONATE 50 MCG/ACT NA SUSP: NASAL | Status: DC

## 2013-08-07 MED ORDER — VALACYCLOVIR HCL 500 MG PO TABS: 500.0000 mg | ORAL_TABLET | ORAL | Status: DC | PRN

## 2013-08-07 NOTE — Assessment & Plan Note (Addendum)
Allergic reaction to amoxicillin, starting azithromycin and Flonase.

## 2013-08-07 NOTE — Assessment & Plan Note (Signed)
Persistent and in an L5 distribution, worst when sitting in a car for long periods of time. Herniated disc home exercises. Return in 4 weeks for this.

## 2013-08-07 NOTE — Progress Notes (Signed)
  Subjective:    CC: Followup  HPI: Upper respiratory symptoms: Was in the middle of treatment for sinus infection with amoxicillin, developed an allergic reaction, then came to see me and she was treated for a viral syndrome so was never had a complete course of antibiotics. She has pain which localizes over her frontal and maxillary sinuses with nasal discharge, pressure, and second sickening.  Right lower leg pain: Worse with riding in a car for long periods of time, radiates down the lateral aspect of the right thigh, lower leg, but not to the foot. Moderate, persistent.   Past medical history, Surgical history, Family history not pertinant except as noted below, Social history, Allergies, and medications have been entered into the medical record, reviewed, and no changes needed.   Review of Systems: No fevers, chills, night sweats, weight loss, chest pain, or shortness of breath.   Objective:    General: Well Developed, well nourished, and in no acute distress.  Neuro: Alert and oriented x3, extra-ocular muscles intact, sensation grossly intact.  HEENT: Normocephalic, atraumatic, pupils equal round reactive to light, neck supple, no masses, no lymphadenopathy, thyroid nonpalpable. Tender to palpation over the frontal and maxillary sinuses. Skin: Warm and dry, no rashes. Cardiac: Regular rate and rhythm, no murmurs rubs or gallops, no lower extremity edema.  Respiratory: Clear to auscultation bilaterally. Not using accessory muscles, speaking in full sentences. Back Exam:  Inspection: Unremarkable  Motion: Flexion 45 deg, Extension 45 deg, Side Bending to 45 deg bilaterally,  Rotation to 45 deg bilaterally  SLR laying: Negative  XSLR laying: Negative  Palpable tenderness: None. FABER: negative. Sensory change: Gross sensation intact to all lumbar and sacral dermatomes.  Reflexes: 2+ at both patellar tendons, 2+ at achilles tendons, Babinski's downgoing.  Strength at foot    Plantar-flexion: 5/5 Dorsi-flexion: 5/5 Eversion: 5/5 Inversion: 5/5  Leg strength  Quad: 5/5 Hamstring: 5/5 Hip flexor: 5/5 Hip abductors: 5/5  Gait unremarkable.  Impression and Recommendations:

## 2013-08-16 ENCOUNTER — Other Ambulatory Visit: Payer: Self-pay | Admitting: Sports Medicine

## 2013-08-19 ENCOUNTER — Encounter: Payer: Self-pay | Admitting: Sports Medicine

## 2013-08-19 ENCOUNTER — Ambulatory Visit (INDEPENDENT_AMBULATORY_CARE_PROVIDER_SITE_OTHER): Payer: No Typology Code available for payment source | Admitting: Sports Medicine

## 2013-08-19 VITALS — BP 128/82 | HR 70 | Wt 151.0 lb

## 2013-08-19 DIAGNOSIS — M1710 Unilateral primary osteoarthritis, unspecified knee: Secondary | ICD-10-CM

## 2013-08-19 DIAGNOSIS — M179 Osteoarthritis of knee, unspecified: Secondary | ICD-10-CM

## 2013-08-19 DIAGNOSIS — IMO0002 Reserved for concepts with insufficient information to code with codable children: Secondary | ICD-10-CM

## 2013-08-19 NOTE — Assessment & Plan Note (Signed)
Left knee is doing well, Dr. Luiz Blare also recommended viscous supplementation. Right knee is now hurting, steroid injection performed today. We will go ahead and get started on approval for viscous supplementation.

## 2013-08-19 NOTE — Progress Notes (Signed)
  Subjective:    CC: Follow  HPI: Knee osteoarthritis: left knee is now doing well, she did see Dr. Luiz Blare who recommended viscous supplementation prior to consideration of arthroscopy. Unfortunately now her right knee is hurting, under the kneecap and along the medial joint line, moderate, persistent, no radiation, she desires interventional treatment.  Past medical history, Surgical history, Family history not pertinant except as noted below, Social history, Allergies, and medications have been entered into the medical record, reviewed, and no changes needed.   Review of Systems: No fevers, chills, night sweats, weight loss, chest pain, or shortness of breath.   Objective:    General: Well Developed, well nourished, and in no acute distress.  Neuro: Alert and oriented x3, extra-ocular muscles intact, sensation grossly intact.  HEENT: Normocephalic, atraumatic, pupils equal round reactive to light, neck supple, no masses, no lymphadenopathy, thyroid nonpalpable.  Skin: Warm and dry, no rashes. Cardiac: Regular rate and rhythm, no murmurs rubs or gallops, no lower extremity edema.  Respiratory: Clear to auscultation bilaterally. Not using accessory muscles, speaking in full sentences. Right Knee: Normal to inspection with no erythema or effusion or obvious bony abnormalities. Tender to palpation along the medial joint line. ROM full in flexion and extension and lower leg rotation. Ligaments with solid consistent endpoints including ACL, PCL, LCL, MCL. Negative Mcmurray's, Apley's, and Thessalonian tests. Non painful patellar compression. Patellar glide without crepitus. Patellar and quadriceps tendons unremarkable. Hamstring and quadriceps strength is normal.   Procedure: Real-time Ultrasound Guided Injection of right knee Device: GE Logiq E  Verbal informed consent obtained.  Time-out conducted.  Noted no overlying erythema, induration, or other signs of local infection.  Skin  prepped in a sterile fashion.  Local anesthesia: Topical Ethyl chloride.  With sterile technique and under real time ultrasound guidance:  2 cc Kenalog 40, 4 cc lidocaine injected into the suprapatellar recess. Completed without difficulty  Pain immediately resolved suggesting accurate placement of the medication.  Advised to call if fevers/chills, erythema, induration, drainage, or persistent bleeding.  Images permanently stored and available for review in the ultrasound unit.  Impression: Technically successful ultrasound guided injection.  Impression and Recommendations:

## 2013-08-25 ENCOUNTER — Encounter: Payer: Self-pay | Admitting: Sports Medicine

## 2013-09-04 ENCOUNTER — Ambulatory Visit: Payer: No Typology Code available for payment source | Admitting: Sports Medicine

## 2013-09-09 ENCOUNTER — Ambulatory Visit: Payer: No Typology Code available for payment source | Admitting: Sports Medicine

## 2013-09-18 ENCOUNTER — Other Ambulatory Visit: Payer: Self-pay

## 2013-11-17 ENCOUNTER — Telehealth: Payer: Self-pay

## 2013-11-17 NOTE — Telephone Encounter (Signed)
Patient called request refill for Ropinirole 1 mg sent to [facility]. Domenik Trice,CMA

## 2013-11-18 MED ORDER — ROPINIROLE HCL 1 MG PO TABS: 1.0000 mg | ORAL_TABLET | Freq: Three times a day (TID) | ORAL | Status: DC

## 2013-11-18 NOTE — Telephone Encounter (Signed)
Spoke to patient advised her that Ropinirole was approved and sent to [facility]. Oneta Rack

## 2013-12-23 ENCOUNTER — Other Ambulatory Visit: Payer: Self-pay | Admitting: Sports Medicine

## 2013-12-23 MED ORDER — FOLIC ACID 1 MG PO TABS: 1.0000 mg | ORAL_TABLET | Freq: Every day | ORAL | Status: DC

## 2013-12-23 MED ORDER — SIMVASTATIN 40 MG PO TABS: 40.0000 mg | ORAL_TABLET | Freq: Every evening | ORAL | Status: DC

## 2013-12-29 ENCOUNTER — Ambulatory Visit (INDEPENDENT_AMBULATORY_CARE_PROVIDER_SITE_OTHER): Payer: BC Managed Care – PPO | Admitting: Sports Medicine

## 2013-12-29 ENCOUNTER — Encounter: Payer: Self-pay | Admitting: Sports Medicine

## 2013-12-29 VITALS — BP 150/86 | HR 65 | Ht 62.0 in | Wt 156.0 lb

## 2013-12-29 DIAGNOSIS — IMO0001 Reserved for inherently not codable concepts without codable children: Secondary | ICD-10-CM

## 2013-12-29 DIAGNOSIS — M179 Osteoarthritis of knee, unspecified: Secondary | ICD-10-CM

## 2013-12-29 DIAGNOSIS — R030 Elevated blood-pressure reading, without diagnosis of hypertension: Secondary | ICD-10-CM

## 2013-12-29 DIAGNOSIS — M1710 Unilateral primary osteoarthritis, unspecified knee: Secondary | ICD-10-CM

## 2013-12-29 DIAGNOSIS — E785 Hyperlipidemia, unspecified: Secondary | ICD-10-CM

## 2013-12-29 DIAGNOSIS — IMO0002 Reserved for concepts with insufficient information to code with codable children: Secondary | ICD-10-CM

## 2013-12-29 DIAGNOSIS — I10 Essential (primary) hypertension: Secondary | ICD-10-CM | POA: Insufficient documentation

## 2013-12-29 MED ORDER — SIMVASTATIN 40 MG PO TABS: 40.0000 mg | ORAL_TABLET | Freq: Every evening | ORAL | Status: DC

## 2013-12-29 MED ORDER — IBUPROFEN-FAMOTIDINE 800-26.6 MG PO TABS: 1.0000 | ORAL_TABLET | Freq: Three times a day (TID) | ORAL | Status: DC

## 2013-12-29 MED ORDER — MONTELUKAST SODIUM 10 MG PO TABS: 10.0000 mg | ORAL_TABLET | Freq: Every day | ORAL | Status: DC

## 2013-12-29 MED ORDER — ROPINIROLE HCL 1 MG PO TABS
1.0000 mg | ORAL_TABLET | Freq: Three times a day (TID) | ORAL | Status: DC
Start: 2013-12-29 — End: 2014-11-03

## 2013-12-29 NOTE — Assessment & Plan Note (Signed)
Left knee has failed steroid injections in [place] supplementation. She will continue NSAIDs, and she is set up for knee arthroscopy with Dr. Berenice Primas in June.

## 2013-12-29 NOTE — Progress Notes (Signed)
  Subjective:    CC: Followup  HPI: Left knee osteoarthritis : has failed steroid injections, physical supplementation, she is currently scheduled for knee arthroscopy in June of this year.  Hyperlipidemia: Needs refill on simvastatin.  Elevated blood pressure: Has been using meloxicam 15 mg daily as well as Advil at bedtime. No headaches, visual changes, chest pain. No lower extremity swelling.  Restless leg syndrome: Needs refill.  Past medical history, Surgical history, Family history not pertinant except as noted below, Social history, Allergies, and medications have been entered into the medical record, reviewed, and no changes needed.   Review of Systems: No fevers, chills, night sweats, weight loss, chest pain, or shortness of breath.   Objective:    General: Well Developed, well nourished, and in no acute distress.  Neuro: Alert and oriented x3, extra-ocular muscles intact, sensation grossly intact.  HEENT: Normocephalic, atraumatic, pupils equal round reactive to light, neck supple, no masses, no lymphadenopathy, thyroid nonpalpable.  Skin: Warm and dry, no rashes. Cardiac: Regular rate and rhythm, no murmurs rubs or gallops, no lower extremity edema.  Respiratory: Clear to auscultation bilaterally. Not using accessory muscles, speaking in full sentences.  Impression and Recommendations:

## 2013-12-29 NOTE — Assessment & Plan Note (Signed)
Heidi Mclaughlin has been taking meloxicam at the maximal dose as well as adding ibuprofen to it. This is the likely cause of her increased blood pressure and fluid retention. We're going to discontinue meloxicam and ibuprofen, she is going to take an occasional Duexis.

## 2013-12-29 NOTE — Assessment & Plan Note (Signed)
Refilling statin, rechecking blood work.

## 2014-01-01 LAB — COMPREHENSIVE METABOLIC PANEL
CO2: 23 mEq/L (ref 19–32)
Creat: 0.91 mg/dL (ref 0.50–1.10)
Glucose, Bld: 98 mg/dL (ref 70–99)
Total Bilirubin: 0.5 mg/dL (ref 0.2–1.2)

## 2014-01-01 LAB — CBC
HCT: 41 % (ref 36.0–46.0)
Hemoglobin: 14.7 g/dL (ref 12.0–15.0)
MCH: 30.6 pg (ref 26.0–34.0)
MCHC: 35.9 g/dL (ref 30.0–36.0)
MCV: 85.2 fL (ref 78.0–100.0)
Platelets: 242 10*3/uL (ref 150–400)
RBC: 4.81 MIL/uL (ref 3.87–5.11)
RDW: 13.5 % (ref 11.5–15.5)
WBC: 3.8 K/uL — ABNORMAL LOW (ref 4.0–10.5)

## 2014-01-01 LAB — COMPREHENSIVE METABOLIC PANEL WITH GFR
ALT: 19 U/L (ref 0–35)
AST: 15 U/L (ref 0–37)
Albumin: 4.5 g/dL (ref 3.5–5.2)
Alkaline Phosphatase: 42 U/L (ref 39–117)
BUN: 19 mg/dL (ref 6–23)
Calcium: 9.4 mg/dL (ref 8.4–10.5)
Chloride: 106 meq/L (ref 96–112)
Potassium: 4.2 meq/L (ref 3.5–5.3)
Sodium: 140 meq/L (ref 135–145)
Total Protein: 6.2 g/dL (ref 6.0–8.3)

## 2014-01-01 LAB — TSH: TSH: 1.447 u[IU]/mL (ref 0.350–4.500)

## 2014-01-01 LAB — HEMOGLOBIN A1C
Hgb A1c MFr Bld: 5.2 % (ref <5.7)
Mean Plasma Glucose: 103 mg/dL (ref <117)

## 2014-01-01 LAB — LIPID PANEL
Cholesterol: 208 mg/dL — ABNORMAL HIGH (ref 0–200)
HDL: 77 mg/dL (ref >39)
LDL Cholesterol: 111 mg/dL — ABNORMAL HIGH (ref 0–99)
Total CHOL/HDL Ratio: 2.7 ratio
Triglycerides: 99 mg/dL (ref <150)
VLDL: 20 mg/dL (ref 0–40)

## 2014-01-13 ENCOUNTER — Encounter: Payer: Self-pay | Admitting: Sports Medicine

## 2014-01-13 ENCOUNTER — Telehealth: Payer: Self-pay

## 2014-01-13 ENCOUNTER — Ambulatory Visit (INDEPENDENT_AMBULATORY_CARE_PROVIDER_SITE_OTHER): Payer: BC Managed Care – PPO | Admitting: Sports Medicine

## 2014-01-13 VITALS — BP 147/83 | HR 63 | Ht 62.0 in | Wt 156.0 lb

## 2014-01-13 DIAGNOSIS — M7062 Trochanteric bursitis, left hip: Secondary | ICD-10-CM | POA: Insufficient documentation

## 2014-01-13 DIAGNOSIS — M1710 Unilateral primary osteoarthritis, unspecified knee: Secondary | ICD-10-CM

## 2014-01-13 DIAGNOSIS — M179 Osteoarthritis of knee, unspecified: Secondary | ICD-10-CM

## 2014-01-13 DIAGNOSIS — IMO0002 Reserved for concepts with insufficient information to code with codable children: Secondary | ICD-10-CM

## 2014-01-13 DIAGNOSIS — I10 Essential (primary) hypertension: Secondary | ICD-10-CM

## 2014-01-13 DIAGNOSIS — M76899 Other specified enthesopathies of unspecified lower limb, excluding foot: Secondary | ICD-10-CM

## 2014-01-13 MED ORDER — HYDROCHLOROTHIAZIDE 12.5 MG PO TABS: 12.5000 mg | ORAL_TABLET | Freq: Every day | ORAL | Status: DC

## 2014-01-13 MED ORDER — AZITHROMYCIN 250 MG PO TABS
ORAL_TABLET | ORAL | Status: DC
Start: 2014-01-13 — End: 2014-02-10

## 2014-01-13 NOTE — Telephone Encounter (Signed)
Patient called stated that she was supposed to get a medication called in to her pharmacy for sinus infection but nothing was called in. She would like for something to be sent in today to her pharmacy. Ellissa Ayo,CMA

## 2014-01-13 NOTE — Assessment & Plan Note (Signed)
Starting low-dose hydrochlorothiazide, blood pressure did not improve with discontinuing double dose NSAIDs. Checking routine blood work.

## 2014-01-13 NOTE — Telephone Encounter (Signed)
Zpack sent.  

## 2014-01-13 NOTE — Assessment & Plan Note (Signed)
We will start conservatively with home rehabilitation exercises. Return in a month, injection if no better.

## 2014-01-13 NOTE — Progress Notes (Signed)
  Subjective:    CC: Follow up  HPI: Elevated blood pressure: Strong family history of hypertension but no personal history, recently she had been taking Mobic and ibuprofen, blood pressure was in the 150s, we discontinued these medications, unfortunately blood pressures continue to be elevated.  Left hip pain: Localized over the greater trochanter, moderate, persistent without radiation.  Past medical history, Surgical history, Family history not pertinant except as noted below, Social history, Allergies, and medications have been entered into the medical record, reviewed, and no changes needed.   Review of Systems: No fevers, chills, night sweats, weight loss, chest pain, or shortness of breath.   Objective:    General: Well Developed, well nourished, and in no acute distress.  Neuro: Alert and oriented x3, extra-ocular muscles intact, sensation grossly intact.  HEENT: Normocephalic, atraumatic, pupils equal round reactive to light, neck supple, no masses, no lymphadenopathy, thyroid nonpalpable.  Skin: Warm and dry, no rashes. Cardiac: Regular rate and rhythm, no murmurs rubs or gallops, no lower extremity edema.  Respiratory: Clear to auscultation bilaterally. Not using accessory muscles, speaking in full sentences. Left hip: Tender to palpation over the greater trochanter.  Impression and Recommendations:

## 2014-01-13 NOTE — Assessment & Plan Note (Signed)
Has a left knee arthroscopy coming up with Dr. Berenice Primas.

## 2014-01-13 NOTE — Telephone Encounter (Signed)
Left message on patient vm letting her know that Z pak has been sent to pharmacy. Idus Rathke,CMA

## 2014-01-14 LAB — COMPREHENSIVE METABOLIC PANEL
ALT: 20 U/L (ref 0–35)
Albumin: 4.8 g/dL (ref 3.5–5.2)
Alkaline Phosphatase: 46 U/L (ref 39–117)
BUN: 16 mg/dL (ref 6–23)
CO2: 27 mEq/L (ref 19–32)
Creat: 0.87 mg/dL (ref 0.50–1.10)
Glucose, Bld: 82 mg/dL (ref 70–99)
Sodium: 139 mEq/L (ref 135–145)
Total Bilirubin: 0.5 mg/dL (ref 0.2–1.2)
Total Protein: 6.5 g/dL (ref 6.0–8.3)

## 2014-01-14 LAB — CBC
HCT: 42 % (ref 36.0–46.0)
Hemoglobin: 14.3 g/dL (ref 12.0–15.0)
MCH: 29.7 pg (ref 26.0–34.0)
MCHC: 34 g/dL (ref 30.0–36.0)
MCV: 87.3 fL (ref 78.0–100.0)
Platelets: 265 K/uL (ref 150–400)
RBC: 4.81 MIL/uL (ref 3.87–5.11)
RDW: 13.7 % (ref 11.5–15.5)
WBC: 7.5 K/uL (ref 4.0–10.5)

## 2014-01-14 LAB — COMPREHENSIVE METABOLIC PANEL WITH GFR
AST: 17 U/L (ref 0–37)
Calcium: 10.1 mg/dL (ref 8.4–10.5)
Chloride: 104 meq/L (ref 96–112)
Potassium: 4.4 meq/L (ref 3.5–5.3)

## 2014-01-14 LAB — TSH: TSH: 1.235 u[IU]/mL (ref 0.350–4.500)

## 2014-02-05 ENCOUNTER — Telehealth: Payer: Self-pay

## 2014-02-05 DIAGNOSIS — IMO0001 Reserved for inherently not codable concepts without codable children: Secondary | ICD-10-CM

## 2014-02-05 DIAGNOSIS — R030 Elevated blood-pressure reading, without diagnosis of hypertension: Principal | ICD-10-CM

## 2014-02-05 MED ORDER — IBUPROFEN-FAMOTIDINE 800-26.6 MG PO TABS: 1.0000 | ORAL_TABLET | Freq: Three times a day (TID) | ORAL | Status: DC

## 2014-02-05 NOTE — Telephone Encounter (Signed)
Refilled. For future reference, there is a mail order pharmacy that she can probably get it cheaper through, however let us know if we should send some through there as well.

## 2014-02-05 NOTE — Telephone Encounter (Signed)
Patient called request a Rx for Ibuprofen-Famotidine 800-26.6 [facility] to [facility]. Patient has appt next week for follow up. Rhonda Cunningham,CMA

## 2014-02-06 NOTE — Telephone Encounter (Signed)
PATIENT HAS BEEN NOTIFIED. Rhonda Cunningham,CMA

## 2014-02-10 ENCOUNTER — Ambulatory Visit (INDEPENDENT_AMBULATORY_CARE_PROVIDER_SITE_OTHER): Payer: BC Managed Care – PPO | Admitting: Sports Medicine

## 2014-02-10 ENCOUNTER — Encounter: Payer: Self-pay | Admitting: Sports Medicine

## 2014-02-10 ENCOUNTER — Other Ambulatory Visit: Payer: Self-pay | Admitting: Sports Medicine

## 2014-02-10 VITALS — BP 135/79 | HR 65 | Ht 62.0 in | Wt 157.0 lb

## 2014-02-10 DIAGNOSIS — M7062 Trochanteric bursitis, left hip: Secondary | ICD-10-CM

## 2014-02-10 DIAGNOSIS — M179 Osteoarthritis of knee, unspecified: Secondary | ICD-10-CM

## 2014-02-10 DIAGNOSIS — M1710 Unilateral primary osteoarthritis, unspecified knee: Secondary | ICD-10-CM

## 2014-02-10 DIAGNOSIS — M76899 Other specified enthesopathies of unspecified lower limb, excluding foot: Secondary | ICD-10-CM

## 2014-02-10 DIAGNOSIS — IMO0002 Reserved for concepts with insufficient information to code with codable children: Secondary | ICD-10-CM

## 2014-02-10 DIAGNOSIS — I10 Essential (primary) hypertension: Secondary | ICD-10-CM

## 2014-02-10 MED ORDER — HYDROCHLOROTHIAZIDE 25 MG PO TABS: 25.0000 mg | ORAL_TABLET | Freq: Every day | ORAL | Status: DC

## 2014-02-10 NOTE — Assessment & Plan Note (Signed)
Improved but unfortunately still weak and still with significant pain. Injection today, continue aggressive hip abductor rehabilitation exercises. Return to see me in a month.

## 2014-02-10 NOTE — Progress Notes (Signed)
  Subjective:    CC: Followup  HPI: Hypertension: Improved significantly with low-dose hydrochlorothiazide, he also has noted a significant amount of her peripheral edema improving.  Left trochanteric bursitis: has been working on hip abductor rehabilitation, unfortunately still weak and still has pain. Moderate, persistent, over the greater trochanter.  Past medical history, Surgical history, Family history not pertinant except as noted below, Social history, Allergies, and medications have been entered into the medical record, reviewed, and no changes needed.   Review of Systems: No fevers, chills, night sweats, weight loss, chest pain, or shortness of breath.   Objective:    General: Well Developed, well nourished, and in no acute distress.  Neuro: Alert and oriented x3, extra-ocular muscles intact, sensation grossly intact.  HEENT: Normocephalic, atraumatic, pupils equal round reactive to light, neck supple, no masses, no lymphadenopathy, thyroid nonpalpable.  Skin: Warm and dry, no rashes. Cardiac: Regular rate and rhythm, no murmurs rubs or gallops, no lower extremity edema.  Respiratory: Clear to auscultation bilaterally. Not using accessory muscles, speaking in full sentences. Left Hip: Good motion, weakness to abduction on the left side. Pelvic alignment unremarkable to inspection and palpation. Standing hip rotation and gait without trendelenburg sign / unsteadiness. Tender to palpation over the greater trochanter. No tenderness over piriformis. No pain with FABER or FADIR. No SI joint tenderness and normal minimal SI movement.  Procedure:  Injection of left greater trochanteric bursa Consent obtained and verified. Time-out conducted. Noted no overlying erythema, induration, or other signs of local infection. Skin prepped in a sterile fashion. Topical analgesic spray: Ethyl chloride. Completed without difficulty. Meds: Spinal needle advanced to the greater trochanter,  contacted bone, and a total of 1 cc Kenalog 40, 4 cc lidocaine injected in a fanlike pattern into the bursa. Pain immediately improved suggesting accurate placement of the medication. Advised to call if fevers/chills, erythema, induration, drainage, or persistent bleeding.  Impression and Recommendations:

## 2014-02-10 NOTE — Assessment & Plan Note (Signed)
Unfortunately she has failed steroid injection as well as Visco supplementation. She does have an arthroscopy scheduled with Dr. Berenice Primas in June, I have advised that she restart meloxicam, wear her knee brace, and remain active.

## 2014-02-10 NOTE — Assessment & Plan Note (Signed)
Blood pressure is greatly improved, increasing hydrochlorothiazide to 25 mg. Return in one month.

## 2014-03-10 ENCOUNTER — Ambulatory Visit (INDEPENDENT_AMBULATORY_CARE_PROVIDER_SITE_OTHER): Payer: BC Managed Care – PPO | Admitting: Sports Medicine

## 2014-03-10 ENCOUNTER — Encounter: Payer: Self-pay | Admitting: Sports Medicine

## 2014-03-10 VITALS — BP 124/83 | HR 64 | Ht 62.0 in | Wt 156.0 lb

## 2014-03-10 DIAGNOSIS — M179 Osteoarthritis of knee, unspecified: Secondary | ICD-10-CM

## 2014-03-10 DIAGNOSIS — I10 Essential (primary) hypertension: Secondary | ICD-10-CM

## 2014-03-10 DIAGNOSIS — M1710 Unilateral primary osteoarthritis, unspecified knee: Secondary | ICD-10-CM

## 2014-03-10 DIAGNOSIS — IMO0002 Reserved for concepts with insufficient information to code with codable children: Secondary | ICD-10-CM

## 2014-03-10 DIAGNOSIS — M76899 Other specified enthesopathies of unspecified lower limb, excluding foot: Secondary | ICD-10-CM

## 2014-03-10 DIAGNOSIS — Z299 Encounter for prophylactic measures, unspecified: Secondary | ICD-10-CM

## 2014-03-10 DIAGNOSIS — M7062 Trochanteric bursitis, left hip: Secondary | ICD-10-CM

## 2014-03-10 NOTE — Progress Notes (Signed)
  Subjective:    CC: Followup  HPI: Hypertension: Well controlled on 25 mg of hydrochlorothiazide.  Trochanter bursitis: continue to improve with aggressive hip abductor rehabilitation.  Knee osteoarthritis : has appointment with Dr. Berenice Primas for consideration of arthroscopy, she has been through physical therapy, steroid injections, and viscous supplementation.  Hyperlipidemia: Stable on statin.  Preventive measure: She will be due for colonoscopy in May, desires referral.  Past medical history, Surgical history, Family history not pertinant except as noted below, Social history, Allergies, and medications have been entered into the medical record, reviewed, and no changes needed.   Review of Systems: No fevers, chills, night sweats, weight loss, chest pain, or shortness of breath.   Objective:    General: Well Developed, well nourished, and in no acute distress.  Neuro: Alert and oriented x3, extra-ocular muscles intact, sensation grossly intact.  HEENT: Normocephalic, atraumatic, pupils equal round reactive to light, neck supple, no masses, no lymphadenopathy, thyroid nonpalpable.  Skin: Warm and dry, no rashes. Cardiac: Regular rate and rhythm, no murmurs rubs or gallops, no lower extremity edema.  Respiratory: Clear to auscultation bilaterally. Not using accessory muscles, speaking in full sentences.  Impression and Recommendations:

## 2014-03-10 NOTE — Assessment & Plan Note (Signed)
She will be due for colonoscopy in May, referral to [facility].

## 2014-03-10 NOTE — Assessment & Plan Note (Signed)
Overall pain-free, has continued to work aggressively on the hip abductor's with her trainer.

## 2014-03-10 NOTE — Assessment & Plan Note (Signed)
Well-controlled on 25 mg of hydrochlorothiazide.

## 2014-03-10 NOTE — Assessment & Plan Note (Signed)
Overall doing well, she does have an appointment with Dr. Berenice Primas later today for consideration of arthroscopy.

## 2014-03-11 ENCOUNTER — Encounter: Payer: Self-pay | Admitting: Internal Medicine

## 2014-03-16 ENCOUNTER — Encounter: Payer: Self-pay | Admitting: Sports Medicine

## 2014-03-16 ENCOUNTER — Ambulatory Visit (INDEPENDENT_AMBULATORY_CARE_PROVIDER_SITE_OTHER): Payer: BC Managed Care – PPO | Admitting: Sports Medicine

## 2014-03-16 VITALS — BP 130/82 | HR 76 | Temp 98.6°F | Ht 62.0 in | Wt 155.0 lb

## 2014-03-16 DIAGNOSIS — J0100 Acute maxillary sinusitis, unspecified: Secondary | ICD-10-CM

## 2014-03-16 MED ORDER — AZITHROMYCIN 250 MG PO TABS: ORAL_TABLET | ORAL | Status: DC

## 2014-03-16 NOTE — Assessment & Plan Note (Signed)
Azithromycin, Flonase. Return as needed.

## 2014-03-16 NOTE — Progress Notes (Signed)
Patient ID: Heidi Mclaughlin, female   DOB: 07/17/1956, 58 y.o.   MRN: [id number]  Subjective:    CC: sinus pressure and pain x 5 days  HPI:  Patient is a pleasant 58 yo woman presenting with sinus pressure and pain for 3 days in the aftermath of a URI.  She initially had cough, sore throat, fever, chills, and nasal congestion last Wednesday that initially began to resolve by Saturday.  However on Sunday she has awoke with worsened sinus pressure and pain.  She now has several days of worsening sinus pain and nasal discharge.  She denies current cough, sore throat, earache or ongoing fever.  She has not tried any medicines.  She has a history of recurrent sinusitis in the past.  She also has seasonal allergies that have been well controlled on montelukast, zyrtec, and nasal steroids.  She does endorse the Mclaughlin sickening.  Past medical history, Surgical history, Family history not pertinant except as noted below, Social history, Allergies, and medications have been entered into the medical record, reviewed, and no changes needed.   Review of Systems: No fevers, chills, night sweats, weight loss, chest pain, or shortness of breath.   Objective:    General: Well Developed, well nourished, and in no acute distress.  Neuro: Alert and oriented x3, extra-ocular muscles intact, sensation grossly intact.  HEENT: Pain on palpation of frontal and maxillary sinuses, Normocephalic, atraumatic, pupils equal round reactive to light, neck supple, no masses, no lymphadenopathy, thyroid nonpalpable. Maxillary sinuses were tender to palpation, oropharynx, nasopharynx, external ear canals are unremarkable. Skin: Warm and dry, no rashes. Cardiac: Regular rate and rhythm, no murmurs rubs or gallops, no lower extremity edema.  Respiratory: Clear to auscultation bilaterally. Not using accessory muscles, speaking in full sentences.  Impression and Recommendations:    Patient likely suffering from sinusitis given her  clinical course and description of symptoms.  Pain on palpation of sinuses also concerning for possible infection.  Given her history or sinus infections we will treat with Z-pack.  - azithromyocin 5 day course - follow up prn if symptoms do not improve

## 2014-03-24 ENCOUNTER — Encounter: Payer: Self-pay | Admitting: Sports Medicine

## 2014-03-24 ENCOUNTER — Ambulatory Visit (INDEPENDENT_AMBULATORY_CARE_PROVIDER_SITE_OTHER): Payer: BC Managed Care – PPO | Admitting: Sports Medicine

## 2014-03-24 ENCOUNTER — Telehealth: Payer: Self-pay | Admitting: Sports Medicine

## 2014-03-24 VITALS — BP 112/78 | HR 73 | Ht 62.0 in | Wt 160.0 lb

## 2014-03-24 DIAGNOSIS — J0100 Acute maxillary sinusitis, unspecified: Secondary | ICD-10-CM

## 2014-03-24 MED ORDER — PHENYLEPHRINE HCL 10 MG PO TABS: 10.0000 mg | ORAL_TABLET | Freq: Four times a day (QID) | ORAL | Status: DC | PRN

## 2014-03-24 MED ORDER — CLARITHROMYCIN 500 MG PO TABS: 500.0000 mg | ORAL_TABLET | Freq: Two times a day (BID) | ORAL | Status: DC

## 2014-03-24 MED ORDER — PREDNISONE 50 MG PO TABS: 50.0000 mg | ORAL_TABLET | Freq: Every day | ORAL | Status: DC

## 2014-03-24 NOTE — Assessment & Plan Note (Signed)
Persistent symptoms despite one half weeks of treatment. Switching to Biaxin, phenylephrine oral, and a course of prednisone. Return to see me in 2 weeks, ENT referral if no better.

## 2014-03-24 NOTE — Addendum Note (Signed)
Addended by: Silverio Decamp on: 03/24/2014 03:48 PM   Modules accepted: Level of Service

## 2014-03-24 NOTE — Progress Notes (Signed)
  Subjective:    CC: Followup  HPI: This very pleasant 58 year old female returns, she was treated for a sinus infection with azithromycin Flonase, unfortunately has persistent symptoms. She describes these predominantly as a sinus headache and a feeling stopped up ears.  Past medical history, Surgical history, Family history not pertinant except as noted below, Social history, Allergies, and medications have been entered into the medical record, reviewed, and no changes needed.   Review of Systems: No fevers, chills, night sweats, weight loss, chest pain, or shortness of breath.   Objective:    General: Well Developed, well nourished, and in no acute distress.  Neuro: Alert and oriented x3, extra-ocular muscles intact, sensation grossly intact.  HEENT: Normocephalic, atraumatic, pupils equal round reactive to light, neck supple, no masses, no lymphadenopathy, thyroid nonpalpable. Oropharynx, nasopharynx, from her ear canals are unremarkable. Skin: Warm and dry, no rashes. Cardiac: Regular rate and rhythm, no murmurs rubs or gallops, no lower extremity edema.  Respiratory: Clear to auscultation bilaterally. Not using accessory muscles, speaking in full sentences. Only minimal tenderness to palpation over the maxillary sinuses.  Impression and Recommendations:

## 2014-03-24 NOTE — Telephone Encounter (Signed)
Autumn from [facility] called. There is a drug interaction between the Simvastatin and Erythromycin. Do you want  to stop the former for a while or change the latter? Autumn can be reached at [phone].  Thank  You.

## 2014-03-24 NOTE — Telephone Encounter (Signed)
Continue current medications including both simvastatin and the macrolide, and she will be on clarithromycin not erythromycin.

## 2014-04-14 ENCOUNTER — Other Ambulatory Visit: Payer: Self-pay | Admitting: Sports Medicine

## 2014-04-28 ENCOUNTER — Telehealth: Payer: Self-pay | Admitting: *Deleted

## 2014-04-28 ENCOUNTER — Ambulatory Visit (AMBULATORY_SURGERY_CENTER): Payer: Self-pay | Admitting: *Deleted

## 2014-04-28 VITALS — Ht 62.0 in | Wt 157.6 lb

## 2014-04-28 DIAGNOSIS — Z1211 Encounter for screening for malignant neoplasm of colon: Secondary | ICD-10-CM

## 2014-04-28 MED ORDER — MOVIPREP 100 G PO SOLR: ORAL | Status: DC

## 2014-04-28 NOTE — Progress Notes (Signed)
No allergies to eggs or soy. No problems with anesthesia.  Pt given Emmi instructions for colonoscopy  No oxygen use  No diet drug use  

## 2014-04-28 NOTE — Telephone Encounter (Signed)
Pt scheduled for direct screening colonoscopy with Dr. Henrene Pastor 05/08/2014.  Last colonoscopy 10 years ago with Dr Franki Cabot in [facility].  Pt says she thinks she had polyps but was told to repeat colonoscopy in 10 years.  Release of information form signed and given to Julieanne Cotton, CMA.

## 2014-04-29 NOTE — Telephone Encounter (Signed)
Received last colon; put on Dr. Blanch Media desk for review.

## 2014-04-30 ENCOUNTER — Telehealth: Payer: Self-pay

## 2014-05-01 NOTE — Telephone Encounter (Signed)
Communicated to patient that Dr. Henrene Pastor had reviewed her previous colonoscopy for which we had gotten a release and that proceeding with the colonoscopy was appropriate.  Patient acknowledged and understood.

## 2014-05-08 ENCOUNTER — Ambulatory Visit (AMBULATORY_SURGERY_CENTER): Payer: BC Managed Care – PPO | Admitting: Internal Medicine

## 2014-05-08 ENCOUNTER — Encounter: Payer: Self-pay | Admitting: Internal Medicine

## 2014-05-08 VITALS — BP 111/61 | HR 65 | Temp 98.6°F | Resp 16 | Ht 62.0 in | Wt 157.0 lb

## 2014-05-08 DIAGNOSIS — Z1211 Encounter for screening for malignant neoplasm of colon: Secondary | ICD-10-CM

## 2014-05-08 MED ORDER — SODIUM CHLORIDE 0.9 % IV SOLN: 500.0000 mL | INTRAVENOUS | Status: DC

## 2014-05-08 NOTE — Progress Notes (Signed)
Report to PACU, RN, vss, BBS= Clear.  

## 2014-05-08 NOTE — Patient Instructions (Signed)
YOU HAD AN ENDOSCOPIC PROCEDURE TODAY AT THE [facility]: Refer to the procedure report that was given to you for any specific questions about what was found during the examination.  If the procedure report does not answer your questions, please call your gastroenterologist to clarify.  If you requested that your care partner not be given the details of your procedure findings, then the procedure report has been included in a sealed envelope for you to review at your convenience later.  YOU SHOULD EXPECT: Some feelings of bloating in the abdomen. Passage of more gas than usual.  Walking can help get rid of the air that was put into your GI tract during the procedure and reduce the bloating. If you had a lower endoscopy (such as a colonoscopy or flexible sigmoidoscopy) you may notice spotting of blood in your stool or on the toilet paper. If you underwent a bowel prep for your procedure, then you may not have a normal bowel movement for a few days.  DIET: Your first meal following the procedure should be a light meal and then it is ok to progress to your normal diet.  A half-sandwich or bowl of soup is an example of a good first meal.  Heavy or fried foods are harder to digest and may make you feel nauseous or bloated.  Likewise meals heavy in dairy and vegetables can cause extra gas to form and this can also increase the bloating.  Drink plenty of fluids but you should avoid alcoholic beverages for 24 hours.  ACTIVITY: Your care partner should take you home directly after the procedure.  You should plan to take it easy, moving slowly for the rest of the day.  You can resume normal activity the day after the procedure however you should NOT DRIVE or use heavy machinery for 24 hours (because of the sedation medicines used during the test).    SYMPTOMS TO REPORT IMMEDIATELY: A gastroenterologist can be reached at any hour.  During normal business hours, 8:30 AM to 5:00 PM Monday through Friday,  call [phone].  After hours and on weekends, please call the GI answering service at [phone] who will take a message and have the physician on call contact you.   Following lower endoscopy (colonoscopy or flexible sigmoidoscopy):  Excessive amounts of blood in the stool  Significant tenderness or worsening of abdominal pains  Swelling of the abdomen that is new, acute  Fever of 100F or higher  FOLLOW UP: If any biopsies were taken you will be contacted by phone or by letter within the next 1-3 weeks.  Call your gastroenterologist if you have not heard about the biopsies in 3 weeks.  Our staff will call the home number listed on your records the next business day following your procedure to check on you and address any questions or concerns that you may have at that time regarding the information given to you following your procedure. This is a courtesy call and so if there is no answer at the home number and we have not heard from you through the emergency physician on call, we will assume that you have returned to your regular daily activities without incident.  SIGNATURES/CONFIDENTIALITY: You and/or your care partner have signed paperwork which will be entered into your electronic medical record.  These signatures attest to the fact that that the information above on your After Visit Summary has been reviewed and is understood.  Full responsibility of the confidentiality of this   discharge information lies with you and/or your care-partner.  Recommendations Continue current screening recommendations with a repeat colonoscopy in 10 years.

## 2014-05-08 NOTE — Op Note (Signed)
[facility]  [address] [place], [id number]   COLONOSCOPY PROCEDURE REPORT  PATIENT: Heidi Mclaughlin, Heidi Mclaughlin  MR#: [id number] BIRTHDATE: 1956-10-11 , 58  yrs. old GENDER: Female ENDOSCOPIST: Eustace Quail, MD REFERRED TK:[id number] Dianah Field, M.D. PROCEDURE DATE:  05/08/2014 PROCEDURE:   Colonoscopy, screening First Screening Colonoscopy - Avg.  risk and is 50 yrs.  old or older - No.  Prior Negative Screening - Now for repeat screening. 10 or more years since last screening  History of Adenoma - Now for follow-up colonoscopy & has been > or = to 3 yrs.  N/A  Polyps Removed Today? No.  Recommend repeat exam, <10 yrs? No. ASA CLASS:   Class II INDICATIONS:average risk screening.   Negative colonoscopy (for polyps) in [place] 10 yrs ago MEDICATIONS: MAC sedation, administered by CRNA and propofol (Diprivan) 280mg  IV  DESCRIPTION OF PROCEDURE:   After the risks benefits and alternatives of the procedure were thoroughly explained, informed consent was obtained.  A digital rectal exam revealed no abnormalities of the rectum.   The LB [id number]  endoscope was introduced through the anus and advanced to the cecum, which was identified by both the appendix and ileocecal valve. No adverse events experienced.   The quality of the prep was good, using MoviPrep  The instrument was then slowly withdrawn as the colon was fully examined.  COLON FINDINGS: Moderate diverticulosis was noted  in the right colon and sigmoid colon.   The colon was otherwise normal.  There was no  inflammation, polyps or cancers unless previously stated. Retroflexed views revealed no abnormalities. The time to cecum=9 minutes 18 seconds.  Withdrawal time=10 minutes 16 seconds.  The scope was withdrawn and the procedure completed. COMPLICATIONS: There were no complications.  ENDOSCOPIC IMPRESSION: 1.   Moderate diverticulosis was noted in the right colon and sigmoid colon 2.   The colon was  otherwise normal  RECOMMENDATIONS: 1. Continue current colorectal screening recommendations for "routine risk" patients with a repeat colonoscopy in 10 years.   eSigned:  Eustace Quail, MD 05/08/2014 9:03 AM   cc: The Patient ; Aundria Mems MD

## 2014-05-11 ENCOUNTER — Telehealth: Payer: Self-pay

## 2014-05-11 NOTE — Telephone Encounter (Signed)
  Follow up Call-  Call back number 05/08/2014  Post procedure Call Back phone  # [phone] hm  Permission to leave phone message Yes     Patient questions:  Do you have a fever, pain , or abdominal swelling? No. Pain Score  0 *  Have you tolerated food without any problems? Yes.    Have you been able to return to your normal activities? Yes.    Do you have any questions about your discharge instructions: Diet   No. Medications  No. Follow up visit  No.  Do you have questions or concerns about your Care? No.  Actions: * If pain score is 4 or above: No action needed, pain <4.  No problems noted per the pt's husband. maw

## 2014-08-04 ENCOUNTER — Ambulatory Visit (INDEPENDENT_AMBULATORY_CARE_PROVIDER_SITE_OTHER): Payer: BC Managed Care – PPO | Admitting: Sports Medicine

## 2014-08-04 ENCOUNTER — Encounter: Payer: Self-pay | Admitting: Sports Medicine

## 2014-08-04 VITALS — BP 141/84 | HR 65 | Ht 62.0 in | Wt 160.0 lb

## 2014-08-04 DIAGNOSIS — M1710 Unilateral primary osteoarthritis, unspecified knee: Secondary | ICD-10-CM

## 2014-08-04 DIAGNOSIS — Z9109 Other allergy status, other than to drugs and biological substances: Secondary | ICD-10-CM

## 2014-08-04 DIAGNOSIS — Z889 Allergy status to unspecified drugs, medicaments and biological substances status: Secondary | ICD-10-CM

## 2014-08-04 DIAGNOSIS — M170 Bilateral primary osteoarthritis of knee: Secondary | ICD-10-CM

## 2014-08-04 DIAGNOSIS — Z0000 Encounter for general adult medical examination without abnormal findings: Secondary | ICD-10-CM

## 2014-08-04 DIAGNOSIS — IMO0002 Reserved for concepts with insufficient information to code with codable children: Secondary | ICD-10-CM

## 2014-08-04 DIAGNOSIS — M5416 Radiculopathy, lumbar region: Secondary | ICD-10-CM

## 2014-08-04 MED ORDER — MAGNESIUM OXIDE 400 MG PO TABS: 800.0000 mg | ORAL_TABLET | Freq: Every day | ORAL | Status: DC

## 2014-08-04 NOTE — Assessment & Plan Note (Signed)
Right-sided L5 distribution, continue rehabilitation exercises, adding magnesium oxide at bedtime.

## 2014-08-04 NOTE — Assessment & Plan Note (Signed)
TDap, flu shot, zostavax.

## 2014-08-04 NOTE — Assessment & Plan Note (Signed)
Knee injections as needed, she has seen orthopedic surgery who also recommends as needed knee injections. Return as needed.

## 2014-08-04 NOTE — Assessment & Plan Note (Signed)
Recently had some itching at bedtime, I have advised 50 mg of Benadryl as needed.

## 2014-08-04 NOTE — Progress Notes (Signed)
  Subjective:    CC: Multiple issues  HPI: Preventative measures: Desires tetanus, influenza, and shingles vaccination.  Knee osteoarthritis : Overall doing well, she has had several injections, and a recent arthroscopy, orthopedic surgery is telling her just to have injections until they fail, we can proceed with knee arthroplasty. She will return as needed for this.  Right lumbar radiculitis: Overall doing well, some cramping at night, amenable to trying simply magnesium oxide at bedtime.  Past medical history, Surgical history, Family history not pertinant except as noted below, Social history, Allergies, and medications have been entered into the medical record, reviewed, and no changes needed.   Review of Systems: No fevers, chills, night sweats, weight loss, chest pain, or shortness of breath.   Objective:    General: Well Developed, well nourished, and in no acute distress.  Neuro: Alert and oriented x3, extra-ocular muscles intact, sensation grossly intact.  HEENT: Normocephalic, atraumatic, pupils equal round reactive to light, neck supple, no masses, no lymphadenopathy, thyroid nonpalpable.  Skin: Warm and dry, no rashes. Cardiac: Regular rate and rhythm, no murmurs rubs or gallops, no lower extremity edema.  Respiratory: Clear to auscultation bilaterally. Not using accessory muscles, speaking in full sentences.  Impression and Recommendations:

## 2014-08-05 ENCOUNTER — Encounter: Payer: Self-pay | Admitting: Sports Medicine

## 2014-08-07 ENCOUNTER — Ambulatory Visit (INDEPENDENT_AMBULATORY_CARE_PROVIDER_SITE_OTHER): Payer: BC Managed Care – PPO | Admitting: Sports Medicine

## 2014-08-07 ENCOUNTER — Encounter: Payer: Self-pay | Admitting: Sports Medicine

## 2014-08-07 VITALS — BP 137/89 | HR 73 | Ht 62.0 in | Wt 162.0 lb

## 2014-08-07 DIAGNOSIS — M170 Bilateral primary osteoarthritis of knee: Secondary | ICD-10-CM

## 2014-08-07 DIAGNOSIS — M7989 Other specified soft tissue disorders: Secondary | ICD-10-CM | POA: Insufficient documentation

## 2014-08-07 DIAGNOSIS — M1710 Unilateral primary osteoarthritis, unspecified knee: Secondary | ICD-10-CM

## 2014-08-07 MED ORDER — TRAMADOL HCL 50 MG PO TABS: ORAL_TABLET | ORAL | Status: DC

## 2014-08-07 NOTE — Progress Notes (Signed)
  Subjective:    CC: Swelling  HPI: This is a very pleasant and healthy 58 year old female, she comes in with complaints of upper and lower extreme swelling for the past several months. She recently stopped her Mobic and feels like things are getting a bit better. No shortness of breath, no chest pain, exercise tolerance is okay. Symptoms are mild, persistent, weight has overall stayed the same over the past 6 months.  Past medical history, Surgical history, Family history not pertinant except as noted below, Social history, Allergies, and medications have been entered into the medical record, reviewed, and no changes needed.   Review of Systems: No fevers, chills, night sweats, weight loss, chest pain, or shortness of breath.   Objective:    General: Well Developed, well nourished, and in no acute distress.  Neuro: Alert and oriented x3, extra-ocular muscles intact, sensation grossly intact.  HEENT: Normocephalic, atraumatic, pupils equal round reactive to light, neck supple, no masses, no lymphadenopathy, thyroid nonpalpable.  Skin: Warm and dry, no rashes. Cardiac: Regular rate and rhythm, no murmurs rubs or gallops, no lower extremity edema.  Respiratory: Clear to auscultation bilaterally. Not using accessory muscles, speaking in full sentences.  Impression and Recommendations:

## 2014-08-07 NOTE — Assessment & Plan Note (Signed)
Discontinue Mobic, switching to tramadol for pain.

## 2014-08-07 NOTE — Assessment & Plan Note (Addendum)
The patient is having generalized feeling of edema in upper and lower extremities. No specific symptoms. Overall weight has remained stable for the past 6 months. This has improved slightly since coming off the meloxicam. We will switch to tramadol for pain. Also checking CBC, CMET, BNP and urinalysis downstairs.

## 2014-08-08 LAB — BRAIN NATRIURETIC PEPTIDE: Brain Natriuretic Peptide: 5.1 pg/mL (ref 0.0–100.0)

## 2014-08-08 LAB — URINALYSIS
Bilirubin Urine: NEGATIVE
Glucose, UA: NEGATIVE mg/dL
Hgb urine dipstick: NEGATIVE
Ketones, ur: NEGATIVE mg/dL
Leukocytes, UA: NEGATIVE
Nitrite: NEGATIVE
Protein, ur: NEGATIVE mg/dL
Specific Gravity, Urine: 1.011 (ref 1.005–1.030)
Urobilinogen, UA: 0.2 mg/dL (ref 0.0–1.0)
pH: 5 (ref 5.0–8.0)

## 2014-08-08 LAB — COMPREHENSIVE METABOLIC PANEL
AST: 22 U/L (ref 0–37)
Albumin: 4.6 g/dL (ref 3.5–5.2)
Alkaline Phosphatase: 46 U/L (ref 39–117)
BUN: 16 mg/dL (ref 6–23)
Calcium: 9.9 mg/dL (ref 8.4–10.5)
Chloride: 99 mEq/L (ref 96–112)
Creat: 0.91 mg/dL (ref 0.50–1.10)
Glucose, Bld: 87 mg/dL (ref 70–99)
Potassium: 4.1 mEq/L (ref 3.5–5.3)
Total Bilirubin: 0.5 mg/dL (ref 0.2–1.2)

## 2014-08-08 LAB — CBC
HCT: 41.4 % (ref 36.0–46.0)
Hemoglobin: 14.5 g/dL (ref 12.0–15.0)
MCH: 29.8 pg (ref 26.0–34.0)
MCHC: 35 g/dL (ref 30.0–36.0)
MCV: 85 fL (ref 78.0–100.0)
Platelets: 250 K/uL (ref 150–400)
RBC: 4.87 MIL/uL (ref 3.87–5.11)
RDW: 13.4 % (ref 11.5–15.5)
WBC: 5 K/uL (ref 4.0–10.5)

## 2014-08-08 LAB — COMPREHENSIVE METABOLIC PANEL WITH GFR
ALT: 28 U/L (ref 0–35)
CO2: 28 meq/L (ref 19–32)
Sodium: 138 meq/L (ref 135–145)
Total Protein: 6.6 g/dL (ref 6.0–8.3)

## 2014-08-26 DIAGNOSIS — K5790 Diverticulosis of intestine, part unspecified, without perforation or abscess without bleeding: Secondary | ICD-10-CM | POA: Insufficient documentation

## 2014-08-26 DIAGNOSIS — G4700 Insomnia, unspecified: Secondary | ICD-10-CM | POA: Insufficient documentation

## 2014-09-01 ENCOUNTER — Encounter: Payer: Self-pay | Admitting: Sports Medicine

## 2014-09-10 ENCOUNTER — Other Ambulatory Visit: Payer: Self-pay | Admitting: Sports Medicine

## 2014-11-03 ENCOUNTER — Encounter: Payer: Self-pay | Admitting: Sports Medicine

## 2014-11-03 ENCOUNTER — Ambulatory Visit (INDEPENDENT_AMBULATORY_CARE_PROVIDER_SITE_OTHER): Payer: BC Managed Care – PPO | Admitting: Sports Medicine

## 2014-11-03 VITALS — BP 120/78 | HR 66 | Ht 62.0 in | Wt 162.0 lb

## 2014-11-03 DIAGNOSIS — R350 Frequency of micturition: Secondary | ICD-10-CM

## 2014-11-03 LAB — POCT URINALYSIS DIPSTICK
Bilirubin, UA: NEGATIVE
Blood, UA: NEGATIVE
Glucose, UA: NEGATIVE
Ketones, UA: NEGATIVE
Leukocytes, UA: NEGATIVE
Nitrite, UA: NEGATIVE
Protein, UA: NEGATIVE
Spec Grav, UA: 1.01
Urobilinogen, UA: 0.2
pH, UA: 5.5

## 2014-11-03 MED ORDER — PHENAZOPYRIDINE HCL 200 MG PO TABS: 200.0000 mg | ORAL_TABLET | Freq: Three times a day (TID) | ORAL | Status: AC

## 2014-11-03 MED ORDER — SULFAMETHOXAZOLE-TRIMETHOPRIM 800-160 MG PO TABS: 1.0000 | ORAL_TABLET | Freq: Two times a day (BID) | ORAL | Status: DC

## 2014-11-03 NOTE — Progress Notes (Signed)
  Subjective:    CC: Urinary frequency  HPI: This is a pleasant 58 year old female with a 1 day history of urinary frequency and urgency without dysuria, abdominal pain or constitutional symptoms. Symptoms are mild, persistent.  Trigger finger: Right fourth, wonders what can be done.  Past medical history, Surgical history, Family history not pertinant except as noted below, Social history, Allergies, and medications have been entered into the medical record, reviewed, and no changes needed.   Review of Systems: No fevers, chills, night sweats, weight loss, chest pain, or shortness of breath.   Objective:    General: Well Developed, well nourished, and in no acute distress.  Neuro: Alert and oriented x3, extra-ocular muscles intact, sensation grossly intact.  HEENT: Normocephalic, atraumatic, pupils equal round reactive to light, neck supple, no masses, no lymphadenopathy, thyroid nonpalpable.  Skin: Warm and dry, no rashes. Cardiac: Regular rate and rhythm, no murmurs rubs or gallops, no lower extremity edema.  Respiratory: Clear to auscultation bilaterally. Not using accessory muscles, speaking in full sentences. Abdomen: Soft, nontender, nondistended, normal bowel sounds, no palpable masses. No costovertebral tenderness.  Urinalysis is negative.  Impression and Recommendations:

## 2014-11-03 NOTE — Assessment & Plan Note (Signed)
Negative urinalysis, symptoms have all for a day, and her symptoms do match that of cystitis. Septra, Pyridium return in 2 weeks, if no better we can proceed with further evaluation and testing, and consider treatment for overactive bladder.

## 2014-11-17 ENCOUNTER — Encounter: Payer: Self-pay | Admitting: Sports Medicine

## 2014-11-17 ENCOUNTER — Ambulatory Visit (INDEPENDENT_AMBULATORY_CARE_PROVIDER_SITE_OTHER): Payer: BLUE CROSS/BLUE SHIELD | Admitting: Sports Medicine

## 2014-11-17 VITALS — BP 117/77 | HR 96 | Ht 62.0 in | Wt 165.0 lb

## 2014-11-17 DIAGNOSIS — R350 Frequency of micturition: Secondary | ICD-10-CM

## 2014-11-17 DIAGNOSIS — M6530 Trigger finger, unspecified finger: Secondary | ICD-10-CM

## 2014-11-17 NOTE — Assessment & Plan Note (Signed)
Right fourth. No pain, triggering is minimal so we will not intervene. She can always come back for a trigger finger injection if symptoms worsen.

## 2014-11-17 NOTE — Assessment & Plan Note (Signed)
Despite a negative urinalysis, completely resolved with Septra and Pyridium. Return as needed for this.

## 2014-11-17 NOTE — Progress Notes (Signed)
  Subjective:    CC: Follow-up  HPI: Trigger finger: Right fourth, is not painful and the triggering is minimal, does not desire any interventional treatment. Pain is mild, persistent.  Urinary frequency: Urinalysis was negative however symptoms were so classic we did treat with Septra and Pyridium, all symptoms have resolved.  Past medical history, Surgical history, Family history not pertinant except as noted below, Social history, Allergies, and medications have been entered into the medical record, reviewed, and no changes needed.   Review of Systems: No fevers, chills, night sweats, weight loss, chest pain, or shortness of breath.   Objective:    General: Well Developed, well nourished, and in no acute distress.  Neuro: Alert and oriented x3, extra-ocular muscles intact, sensation grossly intact.  HEENT: Normocephalic, atraumatic, pupils equal round reactive to light, neck supple, no masses, no lymphadenopathy, thyroid nonpalpable.  Skin: Warm and dry, no rashes. Cardiac: Regular rate and rhythm, no murmurs rubs or gallops, no lower extremity edema.  Respiratory: Clear to auscultation bilaterally. Not using accessory muscles, speaking in full sentences. Right hand: There is a palpable nodule just proximal to the A1 pulley in the right hand fourth digit.  Impression and Recommendations:

## 2014-12-02 ENCOUNTER — Other Ambulatory Visit: Payer: Self-pay | Admitting: Sports Medicine

## 2014-12-30 ENCOUNTER — Other Ambulatory Visit: Payer: Self-pay | Admitting: Sports Medicine

## 2015-01-21 ENCOUNTER — Other Ambulatory Visit: Payer: Self-pay | Admitting: Sports Medicine

## 2015-01-29 ENCOUNTER — Other Ambulatory Visit: Payer: Self-pay | Admitting: Sports Medicine

## 2015-02-04 ENCOUNTER — Other Ambulatory Visit: Payer: Self-pay | Admitting: Sports Medicine

## 2015-05-04 ENCOUNTER — Ambulatory Visit (INDEPENDENT_AMBULATORY_CARE_PROVIDER_SITE_OTHER): Payer: BLUE CROSS/BLUE SHIELD | Admitting: Sports Medicine

## 2015-05-04 ENCOUNTER — Encounter: Payer: Self-pay | Admitting: Sports Medicine

## 2015-05-04 VITALS — BP 111/73 | HR 83 | Temp 97.8°F | Ht 62.0 in | Wt 155.0 lb

## 2015-05-04 DIAGNOSIS — J0101 Acute recurrent maxillary sinusitis: Secondary | ICD-10-CM

## 2015-05-04 DIAGNOSIS — J0100 Acute maxillary sinusitis, unspecified: Secondary | ICD-10-CM | POA: Insufficient documentation

## 2015-05-04 MED ORDER — AZITHROMYCIN 250 MG PO TABS: ORAL_TABLET | ORAL | Status: DC

## 2015-05-04 NOTE — Assessment & Plan Note (Signed)
Present for 2 weeks now with a double sickening, pain and pressure behind the sinuses and radiation to the teeth and ears, continue Flonase, adding azithromycin.

## 2015-05-04 NOTE — Progress Notes (Signed)
  Subjective:    CC: Sinus infection  HPI: Wants her twice a year this pleasant 59 year old female has maxillary sinus pressure with purulent nasal discharge, moderate, persistent without radiation. This is typically cleared with azithromycin. She is having similar symptoms, present now for 2 weeks with the double sickening, radiation to the upper teeth in the ear, purulent nasal discharge, daily Flonase has not yet been affected.  Past medical history, Surgical history, Family history not pertinant except as noted below, Social history, Allergies, and medications have been entered into the medical record, reviewed, and no changes needed.   Review of Systems: No fevers, chills, night sweats, weight loss, chest pain, or shortness of breath.   Objective:    General: Well Developed, well nourished, and in no acute distress.  Neuro: Alert and oriented x3, extra-ocular muscles intact, sensation grossly intact.  HEENT: Normocephalic, atraumatic, pupils equal round reactive to light, neck supple, no masses, no lymphadenopathy, thyroid nonpalpable. Oropharynx, ear canals unremarkable, nasopharynx shows boggy erythematous turbinate. There is tenderness over the maxillary and frontal sinuses. Skin: Warm and dry, no rashes. Cardiac: Regular rate and rhythm, no murmurs rubs or gallops, no lower extremity edema.  Respiratory: Clear to auscultation bilaterally. Not using accessory muscles, speaking in full sentences.  Impression and Recommendations:

## 2015-06-21 ENCOUNTER — Other Ambulatory Visit: Payer: Self-pay | Admitting: Sports Medicine

## 2015-08-17 ENCOUNTER — Encounter: Payer: Self-pay | Admitting: Sports Medicine

## 2015-08-17 ENCOUNTER — Ambulatory Visit (INDEPENDENT_AMBULATORY_CARE_PROVIDER_SITE_OTHER): Payer: BLUE CROSS/BLUE SHIELD | Admitting: Sports Medicine

## 2015-08-17 VITALS — BP 137/78 | HR 63 | Wt 149.0 lb

## 2015-08-17 DIAGNOSIS — Z23 Encounter for immunization: Secondary | ICD-10-CM | POA: Diagnosis not present

## 2015-08-17 DIAGNOSIS — S63092A Other subluxation of left wrist and hand, initial encounter: Secondary | ICD-10-CM

## 2015-08-17 DIAGNOSIS — S63093A Other subluxation of unspecified wrist and hand, initial encounter: Secondary | ICD-10-CM | POA: Insufficient documentation

## 2015-08-17 DIAGNOSIS — S63095A Other dislocation of left wrist and hand, initial encounter: Secondary | ICD-10-CM | POA: Diagnosis not present

## 2015-08-17 NOTE — Assessment & Plan Note (Signed)
Noted to tendon sheath effusion, injection as above, Velcro brace, return to see me in one month.

## 2015-08-17 NOTE — Progress Notes (Signed)
  Subjective:    CC: left wrist pain  HPI: For the past couple weeks this pleasant 59 year old female has had increasing pain over the ulnar aspect of her left wrist after working out, she has significant swelling, pain is moderate, persistent with radiation from the mid forearm down to the pain. No numbness or tingling, sensation is burning.  Past medical history, Surgical history, Family history not pertinant except as noted below, Social history, Allergies, and medications have been entered into the medical record, reviewed, and no changes needed.   Review of Systems: No fevers, chills, night sweats, weight loss, chest pain, or shortness of breath.   Objective:    General: Well Developed, well nourished, and in no acute distress.  Neuro: Alert and oriented x3, extra-ocular muscles intact, sensation grossly intact.  HEENT: Normocephalic, atraumatic, pupils equal round reactive to light, neck supple, no masses, no lymphadenopathy, thyroid nonpalpable.  Skin: Warm and dry, no rashes. Cardiac: Regular rate and rhythm, no murmurs rubs or gallops, no lower extremity edema.  Respiratory: Clear to auscultation bilaterally. Not using accessory muscles, speaking in full sentences. Left Wrist: Inspection normal with no visible erythema or swelling. ROM smooth and normal with good flexion and extension and ulnar/radial deviation that is symmetrical with opposite wrist. Visible swelling over the medial wrist with tenderness over the sixth extensor compartment, and pain referable to the extensor carpi ulnaris tendon. No snuffbox tenderness. No tenderness over Canal of Guyon. Strength 5/5 in all directions without pain. Negative Finkelstein, tinel's and phalens. Negative Watson's test.  Procedure: Real-time Ultrasound Guided Injection of left sixth extensor compartment Device: GE Logiq E  Verbal informed consent obtained.  Time-out conducted.  Noted no overlying erythema, induration, or other  signs of local infection.  Skin prepped in a sterile fashion.  Local anesthesia: Topical Ethyl chloride.  With sterile technique and under real time ultrasound guidance:  25-gauge needle advanced into the extensor part, 0.5 mL kenalog 40, 1 mL lidocaine injected easily Completed without difficulty  Pain immediately resolved suggesting accurate placement of the medication.  Advised to call if fevers/chills, erythema, induration, drainage, or persistent bleeding.  Images permanently stored and available for review in the ultrasound unit.  Impression: Technically successful ultrasound guided injection.  Impression and Recommendations:

## 2015-08-25 ENCOUNTER — Ambulatory Visit (INDEPENDENT_AMBULATORY_CARE_PROVIDER_SITE_OTHER): Payer: BLUE CROSS/BLUE SHIELD | Admitting: Sports Medicine

## 2015-08-25 ENCOUNTER — Encounter: Payer: Self-pay | Admitting: Sports Medicine

## 2015-08-25 VITALS — BP 124/68 | HR 62 | Wt 148.0 lb

## 2015-08-25 DIAGNOSIS — I10 Essential (primary) hypertension: Secondary | ICD-10-CM

## 2015-08-25 DIAGNOSIS — S63095D Other dislocation of left wrist and hand, subsequent encounter: Secondary | ICD-10-CM

## 2015-08-25 DIAGNOSIS — S63092D Other subluxation of left wrist and hand, subsequent encounter: Secondary | ICD-10-CM

## 2015-08-25 NOTE — Assessment & Plan Note (Signed)
Weight loss, decreasing hydrochlorothiazide to one half tab daily.

## 2015-08-25 NOTE — Progress Notes (Signed)
  Subjective:    CC: recurrent wrist pain.  HPI: This is a pleasant 59 year old female, I injected her 6th extensor compartment at the last visit and placed her into a Velcro wrist brace, while in the shower with her wrist brace off she felt a sudden pain running along the sixth extensor compartment, and is back here to see me for further discussion. Pain was severe, persistent, and is now improving significantly.  Past medical history, Surgical history, Family history not pertinant except as noted below, Social history, Allergies, and medications have been entered into the medical record, reviewed, and no changes needed.   Review of Systems: No fevers, chills, night sweats, weight loss, chest pain, or shortness of breath.   Objective:    General: Well Developed, well nourished, and in no acute distress.  Neuro: Alert and oriented x3, extra-ocular muscles intact, sensation grossly intact.  HEENT: Normocephalic, atraumatic, pupils equal round reactive to light, neck supple, no masses, no lymphadenopathy, thyroid nonpalpable.  Skin: Warm and dry, no rashes. Cardiac: Regular rate and rhythm, no murmurs rubs or gallops, no lower extremity edema.  Respiratory: Clear to auscultation bilaterally. Not using accessory muscles, speaking in full sentences. Left Wrist: Inspection normal with no visible erythema or swelling. ROM smooth and normal with good flexion and extension and ulnar/radial deviation that is symmetrical with opposite wrist. Palpation is normal over metacarpals, navicular, lunate, and TFCC; tendons without tenderness/ swelling No snuffbox tenderness. No tenderness over Canal of Guyon. Strength 5/5 in all directions without pain. Negative Finkelstein, tinel's and phalens. Negative Watson's test.  Exos cast placed on the left wrist.  Impression and Recommendations:

## 2015-08-25 NOTE — Assessment & Plan Note (Signed)
There was a single episode of subluxation of the extensor carpi ulnaris when in the shower without the wrist brace. For this reason we are going to switch to an Exos cast, and this will not be removed. Keep this on for one month, and then return to see me.

## 2015-09-14 ENCOUNTER — Ambulatory Visit: Payer: BLUE CROSS/BLUE SHIELD | Admitting: Sports Medicine

## 2015-09-20 ENCOUNTER — Ambulatory Visit (INDEPENDENT_AMBULATORY_CARE_PROVIDER_SITE_OTHER): Payer: BLUE CROSS/BLUE SHIELD | Admitting: Sports Medicine

## 2015-09-20 ENCOUNTER — Encounter: Payer: Self-pay | Admitting: Sports Medicine

## 2015-09-20 VITALS — BP 123/73 | HR 66 | Temp 98.3°F | Resp 16 | Wt 148.4 lb

## 2015-09-20 DIAGNOSIS — S63095D Other dislocation of left wrist and hand, subsequent encounter: Secondary | ICD-10-CM

## 2015-09-20 DIAGNOSIS — S63092D Other subluxation of left wrist and hand, subsequent encounter: Secondary | ICD-10-CM

## 2015-09-20 NOTE — Assessment & Plan Note (Signed)
Responded well with complete resolution after injection and one month of immobilization, return as needed.

## 2015-09-20 NOTE — Progress Notes (Signed)
  Subjective:    CC: Follow-up  HPI: This is a pleasant 59 year old female she had a good response to a sixth extensor compartment injection, unfortunately reinjured her wrist and experienced a subluxation event in the shower a month ago, we mobilized her in Exos cast, and she returns today completely pain-free and with swelling resolved.  Past medical history, Surgical history, Family history not pertinant except as noted below, Social history, Allergies, and medications have been entered into the medical record, reviewed, and no changes needed.   Review of Systems: No fevers, chills, night sweats, weight loss, chest pain, or shortness of breath.   Objective:    General: Well Developed, well nourished, and in no acute distress.  Neuro: Alert and oriented x3, extra-ocular muscles intact, sensation grossly intact.  HEENT: Normocephalic, atraumatic, pupils equal round reactive to light, neck supple, no masses, no lymphadenopathy, thyroid nonpalpable.  Skin: Warm and dry, no rashes. Cardiac: Regular rate and rhythm, no murmurs rubs or gallops, no lower extremity edema.  Respiratory: Clear to auscultation bilaterally. Not using accessory muscles, speaking in full sentences. Left Wrist: Inspection normal with no visible erythema or swelling. ROM smooth and normal with good flexion and extension and ulnar/radial deviation that is symmetrical with opposite wrist. Palpation is normal over metacarpals, navicular, lunate, and TFCC; tendons without tenderness/ swelling No snuffbox tenderness. No tenderness over Canal of Guyon. Strength 5/5 in all directions without pain. Negative Finkelstein, tinel's and phalens. Negative Watson's test.  Impression and Recommendations:

## 2015-10-04 ENCOUNTER — Ambulatory Visit (INDEPENDENT_AMBULATORY_CARE_PROVIDER_SITE_OTHER): Payer: BLUE CROSS/BLUE SHIELD | Admitting: Sports Medicine

## 2015-10-04 ENCOUNTER — Encounter: Payer: Self-pay | Admitting: Sports Medicine

## 2015-10-04 ENCOUNTER — Ambulatory Visit (INDEPENDENT_AMBULATORY_CARE_PROVIDER_SITE_OTHER): Payer: BLUE CROSS/BLUE SHIELD

## 2015-10-04 VITALS — BP 132/81 | HR 59 | Wt 146.0 lb

## 2015-10-04 DIAGNOSIS — M5416 Radiculopathy, lumbar region: Secondary | ICD-10-CM | POA: Diagnosis not present

## 2015-10-04 DIAGNOSIS — J069 Acute upper respiratory infection, unspecified: Secondary | ICD-10-CM

## 2015-10-04 DIAGNOSIS — M4316 Spondylolisthesis, lumbar region: Secondary | ICD-10-CM | POA: Diagnosis not present

## 2015-10-04 MED ORDER — PREDNISONE 50 MG PO TABS: ORAL_TABLET | ORAL | Status: DC

## 2015-10-04 NOTE — Assessment & Plan Note (Signed)
Prednisone, x-rays, formal physical therapy. Return in one month, MRI for interventional if no better.

## 2015-10-04 NOTE — Assessment & Plan Note (Signed)
Continue Flonase and over-the-counter cold and flu medications, lung exam is clear.

## 2015-10-04 NOTE — Progress Notes (Signed)
  Subjective:    CC: Upper respiratory symptoms  HPI: This is a pleasant 59 year old female, she has had 3-4 days of mild sore throat, cough, sore throat, muscle aches and body aches, overall improving significantly. No shortness of breath, cough, wheeze, GI symptoms or abdominal pain.  Left leg pain: This occurred after a long drive, running down the lateral aspect of the left leg, to the foot but not past the knee. Moderate, persistent. No bowel or bladder dysfunction, saddle numbness. No constitutional symptoms.  Past medical history, Surgical history, Family history not pertinant except as noted below, Social history, Allergies, and medications have been entered into the medical record, reviewed, and no changes needed.   Review of Systems: No fevers, chills, night sweats, weight loss, chest pain, or shortness of breath.   Objective:    General: Well Developed, well nourished, and in no acute distress.  Neuro: Alert and oriented x3, extra-ocular muscles intact, sensation grossly intact.  HEENT: Normocephalic, atraumatic, pupils equal round reactive to light, neck supple, no masses, no lymphadenopathy, thyroid nonpalpable. Oropharynx, nasopharynx, ear canals are unremarkable. Skin: Warm and dry, no rashes. Cardiac: Regular rate and rhythm, no murmurs rubs or gallops, no lower extremity edema.  Respiratory: Clear to auscultation bilaterally. Not using accessory muscles, speaking in full sentences. Back Exam:  Inspection: Unremarkable  Motion: Flexion 45 deg, Extension 45 deg, Side Bending to 45 deg bilaterally,  Rotation to 45 deg bilaterally  SLR laying: Negative  XSLR laying: Negative  Palpable tenderness: None. FABER: negative. Sensory change: Gross sensation intact to all lumbar and sacral dermatomes.  Reflexes: 2+ at both patellar tendons, 2+ at achilles tendons, Babinski's downgoing.  Strength at foot  Plantar-flexion: 5/5 Dorsi-flexion: 5/5 Eversion: 5/5 Inversion: 5/5  Leg  strength  Quad: 5/5 Hamstring: 5/5 Hip flexor: 5/5 Hip abductors: 5/5  Gait unremarkable.  Impression and Recommendations:

## 2015-10-05 MED ORDER — CYCLOBENZAPRINE HCL 10 MG PO TABS: ORAL_TABLET | ORAL | Status: DC

## 2015-10-11 ENCOUNTER — Encounter: Payer: Self-pay | Admitting: Sports Medicine

## 2015-10-11 ENCOUNTER — Ambulatory Visit (INDEPENDENT_AMBULATORY_CARE_PROVIDER_SITE_OTHER): Payer: BLUE CROSS/BLUE SHIELD | Admitting: Sports Medicine

## 2015-10-11 DIAGNOSIS — M5416 Radiculopathy, lumbar region: Secondary | ICD-10-CM | POA: Diagnosis not present

## 2015-10-11 MED ORDER — HYDROCODONE-ACETAMINOPHEN 5-325 MG PO TABS: 1.0000 | ORAL_TABLET | Freq: Three times a day (TID) | ORAL | Status: DC | PRN

## 2015-10-11 NOTE — Assessment & Plan Note (Signed)
Adding a bit of hydrocodone, she will complete formal physical therapy and return to see me in one month, at which point we will proceed with an MRI if no better.

## 2015-10-11 NOTE — Progress Notes (Signed)
  Subjective:    CC: "Leg hurts"  HPI: Patient returns after 1 week after diagnosis of lumbar radiculopathy. Patient has been treated with a 5d course of prednisone and a muscle relaxer. She says that the prednisone helped but the muscle relaxer has not helped. She scheduled her first PT visit for Wednesday (11/30). She looked up some exercises online and has been trying them with mixed results.  Patient says that the pain has not changed dramatically in the past week. She still gets the same shooting pain that starts in her back and goes to her feet. She does say that she does have some numbness in that foot but is unable to localize it to any specific part. The pain is limiting her sleep as she can wake up at night. She thinks this is due to moving around while asleep.  Past medical history, Surgical history, Family history not pertinant except as noted below, Social history, Allergies, and medications have been entered into the medical record, reviewed, and no changes needed.   Review of Systems: No fevers, chills, night sweats, weight loss, chest pain, or shortness of breath.   Objective:    General: Well Developed, well nourished, and in no acute distress.  Neuro: Alert and oriented x3, extra-ocular muscles intact, sensation grossly intact.  HEENT: Normocephalic, atraumatic, pupils equal round reactive to light, neck supple, no masses, no lymphadenopathy, thyroid nonpalpable.  Skin: Warm and dry, no rashes. Cardiac: Regular rate and rhythm, no murmurs rubs or gallops, no lower extremity edema.  Respiratory: Clear to auscultation bilaterally. Not using accessory muscles, speaking in full sentences. Back Exam:  Inspection: Unremarkable  Motion: Flexion 45 deg, Extension 45 deg, Side Bending to 45 deg bilaterally,  Rotation to 45 deg bilaterally  SLR laying: Negative  XSLR laying: Negative  Palpable tenderness: None. FABER: negative. Sensory change: Gross sensation intact to all lumbar  and sacral dermatomes.  Reflexes: 2+ at both patellar tendons, 2+ at achilles tendons, Babinski's downgoing. Strength at foot  Plantar-flexion: 5/5 Dorsi-flexion: 5/5 Eversion: 5/5 Inversion: 5/5  Leg strength  Quad: 5/5 Hamstring: 5/5 Hip flexor: 5/5 Hip abductors: 5/5  Gait unremarkable.  Impression and Recommendations:    Will not change course at this time as the patient returned before completing a PT regimen. As patient is reporting pain is limiting sleep and is a large burden on her at this time, will Rx hydrocodone. Patient to return in ~1 month after completion of PT. Will reassess need for MRI at that time.

## 2015-10-12 ENCOUNTER — Ambulatory Visit (INDEPENDENT_AMBULATORY_CARE_PROVIDER_SITE_OTHER): Payer: BLUE CROSS/BLUE SHIELD | Admitting: Physical Therapy

## 2015-10-12 ENCOUNTER — Encounter: Payer: Self-pay | Admitting: Physical Therapy

## 2015-10-12 DIAGNOSIS — R52 Pain, unspecified: Secondary | ICD-10-CM

## 2015-10-12 DIAGNOSIS — R198 Other specified symptoms and signs involving the digestive system and abdomen: Secondary | ICD-10-CM

## 2015-10-12 DIAGNOSIS — M6281 Muscle weakness (generalized): Secondary | ICD-10-CM | POA: Diagnosis not present

## 2015-10-12 DIAGNOSIS — R6889 Other general symptoms and signs: Secondary | ICD-10-CM

## 2015-10-12 DIAGNOSIS — R29898 Other symptoms and signs involving the musculoskeletal system: Secondary | ICD-10-CM

## 2015-10-12 NOTE — Therapy (Signed)
[place], [place], [address] Phone: [phone]   Fax:  [phone]  Physical Therapy Evaluation  Patient Details  Name: Heidi Mclaughlin MRN: [id number] Date of Birth: March 07, 1956 Referring Provider: Dr Dianah Field  Encounter Date: 10/12/2015      PT End of Session - 10/12/15 1018    Visit Number 1   Number of Visits 8   Date for PT Re-Evaluation 11/09/15   PT Start Time 0933   PT Stop Time 1033   PT Time Calculation (min) 60 min   Activity Tolerance Patient tolerated treatment well  had good relief after traction      Past Medical History  Diagnosis Date  . Allergy-induced asthma   . Hyperlipidemia   . RLS (restless legs syndrome)   . Allergy   . Arthritis   . Hypertension     Past Surgical History  Procedure Laterality Date  . Cholecystectomy  1982  . Appendectomy  1982  . Abdominal hysterectomy  2003  . Incontinence surgery  2003  . Tubal ligation  1992  . Tonsillectomy and adenoidectomy  1962    There were no vitals filed for this visit.  Visit Diagnosis:  Pain of multiple sites - Plan: PT plan of care cert/re-cert  Weakness of back - Plan: PT plan of care cert/re-cert  Abdominal weakness - Plan: PT plan of care cert/re-cert  Activity intolerance - Plan: PT plan of care cert/re-cert      Subjective Assessment - 10/12/15 0936    Subjective Patient reports onset of low back pain about 2 wks ago with a long drive.  The pain has gotten progressively worse. was on prednisone for 5 days last week. The pain starts in the Lt low back buttock and travels into her Lt ankle.    Pertinent History h/o minor back issues that have self resolved.  Lt knee scope 2015   How long can you sit comfortably? the worse only tolerates 5 min   How long can you stand comfortably? limited to a couple of min.    How long can you walk comfortably? walking is OK   Diagnostic tests x-rays  degenerative changes   Patient Stated Goals get rid of pain and return to her gym routine and trainer. Sleep again.    Currently in Pain? Yes   Pain Score 9   at its worst, a 1/10 at its best however it doesnt last.    Pain Location Buttocks   Pain Orientation Left   Pain Descriptors / Indicators Sharp;Spasm   Pain Type Acute pain   Pain Radiating Towards into Lt foot   Pain Onset 1 to 4 weeks ago   Pain Frequency Constant   Aggravating Factors  random things, sitting and standing still    Pain Relieving Factors walking and using medication            [facility] PT Assessment - 10/12/15 0001    Assessment   Medical Diagnosis Lt lumbar radiculopathy   Referring Provider Dr Dianah Field   Onset Date/Surgical Date 09/28/15   Hand Dominance Right   Next MD Visit 10/25/15   Prior Therapy not for this   Precautions   Precautions None   Balance Screen   Has the patient fallen in the past 6 months No   Has the patient had a decrease in activity level because of a fear of falling?  No   Is the patient reluctant to leave their home because  of a fear of falling?  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   Leisure exercise, walking, visit with friends   Observation/Other Assessments   Focus on Therapeutic Outcomes (FOTO)  58% limited   Functional Tests   Functional tests Squat;Single leg stance   Squat   Comments shifts weight to the right   Single Leg Stance   Comments Rt > 12 sec, Lt 7 sec with lateral hip pain   Posture/Postural Control   Posture/Postural Control Postural limitations   Postural Limitations Forward head;Decreased lumbar lordosis   ROM / Strength   AROM / PROM / Strength  AROM;Strength   AROM   Overall AROM Comments lumbar WNL pain with Lt rotation   AROM Assessment Site --  LE's WNL   Strength   Overall Strength --  Rt LE WNL, except hip ext 4/5   Overall Strength Comments Lt foot and knee 4+/5   Strength Assessment Site --  Lt hip abd 4+/5, ext 4-/5, TA fair +, multifidi poor   Flexibility   Soft Tissue Assessment /Muscle Length --  very flexible   Palpation   Spinal mobility good mobility in lumbar spine, pain with Rt L5 UPA and Lt L1 UPA   Palpation comment no palpable muscle spasms however she did take a muslce relaxer this AM   Special Tests    Special Tests --  (-) lumbar and SI special tests                   [facility] Adult PT Treatment/Exercise - 10/12/15 0001    Exercises   Exercises Lumbar   Lumbar Exercises: Stretches   Double Knee to Chest Stretch 30 seconds   Lumbar Exercises: Prone   Other Prone Lumbar Exercises pelvic press routine, lower body, VC for form.    Modalities   Modalities Traction   Traction   Type of Traction Lumbar   Min (lbs) 20   Max (lbs) 40   Hold Time 60   Rest Time 20   Time 19                PT Education - 10/12/15 1211    Education provided Yes   Education Details HEP & log rolling   Person(s) Educated Patient   Methods Explanation;Demonstration;Handout   Comprehension Returned demonstration;Verbalized understanding             PT Long Term Goals - 10/12/15 1215    PT LONG TERM GOAL #1   Title I with advanced HEP ( 11/09/15)    Time 4   Period Weeks   Status New   PT LONG TERM GOAL #2   Title demo strong contraction of TA and multifidi with core exercise (11/09/15)    Time 4   Period Weeks   Status New   PT LONG TERM GOAL #3   Title report pain decrease =/> 75% (11/09/15)    Time 4   Period Weeks   Status New   PT LONG TERM GOAL #4   Title return to the gym and her personal trainer without difficulty or increased pain (11/09/15)   Time 4   Period Weeks    Status New  PT LONG TERM GOAL #5   Title improve FOTO =/< 35% limited (11/09/15)    Time 4   Period Weeks   Status New               Plan - 10/12/15 1213    Clinical Impression Statement 59 yo female presents with Lt lumbar radiculopathy.  The pain interferes with her ability to perform IADLs.  She has some deep core weakness and has adjusted her functional mobility due to pain.  She is hyperflexibilty and lacks core stability.    Pt will benefit from skilled therapeutic intervention in order to improve on the following deficits Decreased strength;Pain;Decreased activity tolerance;Difficulty walking   Rehab Potential Excellent   PT Frequency 2x / week   PT Duration 4 weeks   PT Treatment/Interventions Traction;Ultrasound;Neuromuscular re-education;Patient/family education;Cryotherapy;Electrical Stimulation;Moist Heat;Therapeutic exercise;Manual techniques   PT Next Visit Plan continue traction, core stability    Consulted and Agree with Plan of Care Patient         Problem List Patient Active Problem List   Diagnosis Date Noted  . Upper respiratory infection, viral 10/04/2015  . Tendinitis of left extensor carpi ulnaris tendon 08/17/2015  . Trigger finger, acquired 11/17/2014  . Leg swelling 08/07/2014  . Trochanteric bursitis of left hip 01/13/2014  . Benign essential hypertension 12/29/2013  . Left lumbar radiculitis 08/07/2013  . Multiple allergies 04/24/2013  . Hyperlipidemia 04/24/2013  . Restless legs 04/24/2013  . Knee osteoarthritis 04/24/2013  . Annual physical exam 04/24/2013    Jeral Pinch PT 10/12/2015, 12:21 PM  [facility] [place], [place], [address] Phone: [phone]   Fax:  [phone]  Name: Heidi Mclaughlin MRN: [id number] Date of Birth: 08/21/1956

## 2015-10-12 NOTE — Patient Instructions (Signed)
Pelvic Press   Place hands under belly between navel and pubic bone, palms up. Feel pressure on hands. Increase pressure on hands by pressing pelvis down. This is NOT a pelvic tilt. Hold __5_ seconds. Relax. Repeat _10__ times. Once a day.  KNEE: Flexion - Prone   Hold pelvic press. Bend knee. Raise heel toward buttocks. Repeat on opposite leg. Do not raise hips. _10__ reps per set. When this is mastered, pull both heels up at same time, x 10 reps.  Once a day  Leg Lift: One-Leg    Press pelvis down. Keep knee straight; lengthen and lift one leg (from waist). Do not twist body. Keep other leg down. Hold _1__ seconds. Relax. Repeat 10 time. Repeat with other leg. Once a day    HIP: Extension / KNEE: Flexion - Prone    Hold pelvic press. Bend knee, squeeze glutes. Raise leg up  10___ reps, once a day.  Knee-to-Chest Stretch: Bilateral    With hands behind knees, pull both knees in to chest until a comfortable stretch is felt in lower back and buttocks. Keep back relaxed. Hold _30___ seconds. Repeat __1-2__ times per set. Do __1__ sets per session. Do ___1_ sessions per day.   [facility] [place], [place] [address]  [phone] (office) [phone] (fax)

## 2015-10-13 ENCOUNTER — Ambulatory Visit (INDEPENDENT_AMBULATORY_CARE_PROVIDER_SITE_OTHER): Payer: BLUE CROSS/BLUE SHIELD | Admitting: Physical Therapy

## 2015-10-13 DIAGNOSIS — R198 Other specified symptoms and signs involving the digestive system and abdomen: Secondary | ICD-10-CM

## 2015-10-13 DIAGNOSIS — M6281 Muscle weakness (generalized): Secondary | ICD-10-CM | POA: Diagnosis not present

## 2015-10-13 DIAGNOSIS — R6889 Other general symptoms and signs: Secondary | ICD-10-CM | POA: Diagnosis not present

## 2015-10-13 DIAGNOSIS — R52 Pain, unspecified: Secondary | ICD-10-CM | POA: Diagnosis not present

## 2015-10-13 DIAGNOSIS — R29898 Other symptoms and signs involving the musculoskeletal system: Secondary | ICD-10-CM

## 2015-10-13 NOTE — Therapy (Signed)
[place], [place], [address] Phone: [phone]   Fax:  [phone]  Physical Therapy Treatment  Patient Details  Name: Heidi Mclaughlin MRN: [id number] Date of Birth: 04-07-1956 Referring Provider: Dr Dianah Field  Encounter Date: 10/13/2015      PT End of Session - 10/13/15 0932    Visit Number 2   Number of Visits 8   Date for PT Re-Evaluation 11/09/15   PT Start Time 0932   PT Stop Time 1025   PT Time Calculation (min) 53 min   Activity Tolerance Patient tolerated treatment well      Past Medical History  Diagnosis Date  . Allergy-induced asthma   . Hyperlipidemia   . RLS (restless legs syndrome)   . Allergy   . Arthritis   . Hypertension     Past Surgical History  Procedure Laterality Date  . Cholecystectomy  1982  . Appendectomy  1982  . Abdominal hysterectomy  2003  . Incontinence surgery  2003  . Tubal ligation  1992  . Tonsillectomy and adenoidectomy  1962    There were no vitals filed for this visit.  Visit Diagnosis:  Pain of multiple sites  Weakness of back  Abdominal weakness  Activity intolerance      Subjective Assessment - 10/13/15 0933    Subjective Pt reports she feels so much better, about 33% improvement in her pain, she didnt' need to take any medication this AM   Currently in Pain? Yes   Pain Score 1    Pain Location Buttocks   Pain Orientation Left   Pain Descriptors / Indicators Dull  twinge   Pain Radiating Towards into lateral Lt ankle   Pain Onset 1 to 4 weeks ago                         [facility] Adult PT Treatment/Exercise - 10/13/15 0001    Lumbar Exercises: Stretches   Single Knee to Chest Stretch 30 seconds  each side   ITB Stretch --  10 reps cross body stetch with strap abb to adduction   Lumbar Exercises: Aerobic   Stationary Bike Nustep L5x5'   Lumbar Exercises: Standing   Wall Slides 20 reps  PRN rest   Other Standing Lumbar Exercises 2x10 SLS with FWD leans   Lumbar Exercises: Supine   Bridge 10 reps;5 seconds  feet on ball   Bridge Limitations 4x5 bridge with SLR   Other Supine Lumbar Exercises 2x10 TA table tops with heel taps.    Modalities   Modalities Traction   Traction   Type of Traction Lumbar   Min (lbs) 25   Max (lbs) 50   Hold Time 60   Rest Time 20   Time 19                PT Education - 10/12/15 1211    Education provided Yes   Education Details HEP & log rolling   Person(s) Educated Patient   Methods Explanation;Demonstration;Handout   Comprehension Returned demonstration;Verbalized understanding             PT Long Term Goals - 10/12/15 1215    PT LONG TERM GOAL #1   Title I with advanced HEP ( 11/09/15)    Time 4   Period Weeks   Status New   PT LONG TERM GOAL #2   Title demo strong contraction of TA and multifidi with core exercise (11/09/15)  Time 4   Period Weeks   Status New   PT LONG TERM GOAL #3   Title report pain decrease =/> 75% (11/09/15)    Time 4   Period Weeks   Status New   PT LONG TERM GOAL #4   Title return to the gym and her personal trainer without difficulty or increased pain (11/09/15)   Time 4   Period Weeks   Status New   PT LONG TERM GOAL #5   Title improve FOTO =/< 35% limited (11/09/15)    Time 4   Period Weeks   Status New               Plan - 10/13/15 1002    Clinical Impression Statement Pt had a great response to traction with 33% reduction in overall pain.  She tolerates the core work with some VC for form.  Only second visit, no goals met.    Pt will benefit from skilled therapeutic intervention in order to improve on the following deficits Decreased strength;Pain;Decreased activity tolerance;Difficulty walking   Rehab Potential Excellent   PT Frequency 2x / week   PT Duration 4 weeks   PT Treatment/Interventions Traction;Ultrasound;Neuromuscular re-education;Patient/family  education;Cryotherapy;Electrical Stimulation;Moist Heat;Therapeutic exercise;Manual techniques   PT Next Visit Plan continue traction, core stability    Consulted and Agree with Plan of Care Patient        Problem List Patient Active Problem List   Diagnosis Date Noted  . Upper respiratory infection, viral 10/04/2015  . Tendinitis of left extensor carpi ulnaris tendon 08/17/2015  . Trigger finger, acquired 11/17/2014  . Leg swelling 08/07/2014  . Trochanteric bursitis of left hip 01/13/2014  . Benign essential hypertension 12/29/2013  . Left lumbar radiculitis 08/07/2013  . Multiple allergies 04/24/2013  . Hyperlipidemia 04/24/2013  . Restless legs 04/24/2013  . Knee osteoarthritis 04/24/2013  . Annual physical exam 04/24/2013    Jeral Pinch PT 10/13/2015, 10:10 AM  [facility] [place], [place], [address] Phone: [phone]   Fax:  [phone]  Name: Heidi Mclaughlin MRN: [id number] Date of Birth: 12-03-1955

## 2015-10-19 ENCOUNTER — Encounter: Payer: BLUE CROSS/BLUE SHIELD | Admitting: Physical Therapy

## 2015-10-21 ENCOUNTER — Ambulatory Visit (INDEPENDENT_AMBULATORY_CARE_PROVIDER_SITE_OTHER): Payer: BLUE CROSS/BLUE SHIELD | Admitting: Physical Therapy

## 2015-10-21 DIAGNOSIS — M6281 Muscle weakness (generalized): Secondary | ICD-10-CM | POA: Diagnosis not present

## 2015-10-21 DIAGNOSIS — R6889 Other general symptoms and signs: Secondary | ICD-10-CM | POA: Diagnosis not present

## 2015-10-21 DIAGNOSIS — R52 Pain, unspecified: Secondary | ICD-10-CM | POA: Diagnosis not present

## 2015-10-21 DIAGNOSIS — R198 Other specified symptoms and signs involving the digestive system and abdomen: Secondary | ICD-10-CM | POA: Diagnosis not present

## 2015-10-21 DIAGNOSIS — R29898 Other symptoms and signs involving the musculoskeletal system: Secondary | ICD-10-CM

## 2015-10-21 NOTE — Therapy (Signed)
[place], [place], [address] Phone: [phone]   Fax:  [phone]  Physical Therapy Treatment  Patient Details  Name: Heidi Mclaughlin MRN: [id number] Date of Birth: 11-22-1955 Referring Provider: Dr. Helane Rima  Encounter Date: 10/21/2015      PT End of Session - 10/21/15 1449    Visit Number 3   Number of Visits 8   Date for PT Re-Evaluation 11/09/15   PT Start Time 1448   PT Stop Time 1557   PT Time Calculation (min) 69 min   Activity Tolerance Patient tolerated treatment well      Past Medical History  Diagnosis Date  . Allergy-induced asthma   . Hyperlipidemia   . RLS (restless legs syndrome)   . Allergy   . Arthritis   . Hypertension     Past Surgical History  Procedure Laterality Date  . Cholecystectomy  1982  . Appendectomy  1982  . Abdominal hysterectomy  2003  . Incontinence surgery  2003  . Tubal ligation  1992  . Tonsillectomy and adenoidectomy  1962    There were no vitals filed for this visit.  Visit Diagnosis:  Pain of multiple sites  Weakness of back  Abdominal weakness  Activity intolerance      Subjective Assessment - 10/21/15 1450    Subjective Pt reports she continues with pain in LLE; worse at night up to 8/10.  During day she said she stays busy, distracts her from the pain.  Still walking 15-20 min on treadmill, strength training UE 2x/wk.  One of biggest problems is not being able to sleep.    Currently in Pain? Yes   Pain Score 3    Pain Location Buttocks   Pain Orientation Left   Pain Descriptors / Indicators Sharp   Pain Radiating Towards into Lt lateral ankle.    Aggravating Factors  prolonged sitting standing.    Pain Relieving Factors traction, medication, ??            [facility] PT Assessment - 10/21/15 0001    Assessment   Medical Diagnosis Lt lumbar radiculopathy   Referring Provider Dr. Helane Rima   Onset Date/Surgical Date  09/28/15   Hand Dominance Right   Next MD Visit 10/25/15   Prior Therapy not for this           [facility] Adult PT Treatment/Exercise - 10/21/15 0001    Lumbar Exercises: Stretches   Double Knee to Chest Stretch 2 reps;30 seconds   ITB Stretch; hamstring stretch  2 reps;30 seconds  each leg, with strap   ITB Stretch Limitations (followed by hip adductor stretch with strap x 2 reps x 30 sec each leg)   Piriformis Stretch 2 reps;30 seconds  supine   Lumbar Exercises: Aerobic   Stationary Bike NuStep L4: 63mn   Lumbar Exercises: Sidelying   Clam 10 reps  2 sets each side   Lumbar Exercises: Prone   Other Prone Lumbar Exercises Pelvic press x 5 sec hold x 10 reps; with unilateral knee flexion x 10 reps each leg   Modalities   Modalities Traction;Ultrasound;EBuyer, retailLt glute/ piriformis   Electrical Stimulation Action combo [place]  Electrical Stimulation Parameters to tolerance    Electrical Stimulation Goals Pain   Ultrasound   Ultrasound Location Lt glute/ piriformis    Ultrasound Parameters combo/US.  100%, 1.3 w/cm2, 8 min    Ultrasound Goals Pain  Traction   Min (lbs) 25   Max (lbs) 50   Hold Time 60   Rest Time 20   Time 19   Manual Therapy   Manual Therapy Soft tissue mobilization   Manual therapy comments trial of long leg traction - no change in symptoms.    Soft tissue mobilization to Lt iliopsoas, Lt glute/ piriformis with passive ER/IR of LE.            PT Long Term Goals - 10/21/15 1544    PT LONG TERM GOAL #1   Title I with advanced HEP ( 11/09/15)    Time 4   Period Weeks   Status On-going   PT LONG TERM GOAL #2   Title demo strong contraction of TA and multifidi with core exercise (11/09/15)    Time 4   Period Weeks   Status On-going   PT LONG TERM GOAL #3   Title report pain decrease =/> 75% (11/09/15)    Time 4   Period Weeks   Status On-going   PT LONG TERM GOAL #4    Title return to the gym and her personal trainer without difficulty or increased pain (11/09/15)   Time 4   Period Weeks   Status On-going   PT LONG TERM GOAL #5   Title improve FOTO =/< 35% limited (11/09/15)    Time 4   Period Weeks   Status On-going               Plan - 10/21/15 1543    Clinical Impression Statement Pt had return of symptoms shortly after last session.  Pt reported reduction of symptoms during ther ex and further reduction with modalities/traction.  PRogressing towards goals.    Pt will benefit from skilled therapeutic intervention in order to improve on the following deficits Decreased strength;Pain;Decreased activity tolerance;Difficulty walking   Rehab Potential Excellent   PT Frequency 2x / week   PT Duration 4 weeks   PT Treatment/Interventions Traction;Ultrasound;Neuromuscular re-education;Patient/family education;Cryotherapy;Electrical Stimulation;Moist Heat;Therapeutic exercise;Manual techniques   PT Next Visit Plan continue traction, core stability         Problem List Patient Active Problem List   Diagnosis Date Noted  . Upper respiratory infection, viral 10/04/2015  . Tendinitis of left extensor carpi ulnaris tendon 08/17/2015  . Trigger finger, acquired 11/17/2014  . Leg swelling 08/07/2014  . Trochanteric bursitis of left hip 01/13/2014  . Benign essential hypertension 12/29/2013  . Left lumbar radiculitis 08/07/2013  . Multiple allergies 04/24/2013  . Hyperlipidemia 04/24/2013  . Restless legs 04/24/2013  . Knee osteoarthritis 04/24/2013  . Annual physical exam 04/24/2013   Kerin Perna, PTA 10/21/2015 3:45 PM  [place], [place], [address] Phone: [phone]   Fax:  [phone]  Name: Heidi Mclaughlin MRN: [id number] Date of Birth: 22-May-1956

## 2015-10-22 ENCOUNTER — Ambulatory Visit (INDEPENDENT_AMBULATORY_CARE_PROVIDER_SITE_OTHER): Payer: BLUE CROSS/BLUE SHIELD | Admitting: Physical Therapy

## 2015-10-22 DIAGNOSIS — R198 Other specified symptoms and signs involving the digestive system and abdomen: Secondary | ICD-10-CM

## 2015-10-22 DIAGNOSIS — R52 Pain, unspecified: Secondary | ICD-10-CM

## 2015-10-22 DIAGNOSIS — R29898 Other symptoms and signs involving the musculoskeletal system: Secondary | ICD-10-CM

## 2015-10-22 DIAGNOSIS — M6281 Muscle weakness (generalized): Secondary | ICD-10-CM | POA: Diagnosis not present

## 2015-10-22 DIAGNOSIS — R6889 Other general symptoms and signs: Secondary | ICD-10-CM

## 2015-10-22 NOTE — Patient Instructions (Signed)
  Abdominal Bracing With Pelvic Floor (Hook-Lying)   With neutral spine, tighten pelvic floor and abdominals. Hold 10 seconds. Repeat __10_ times. Do _1__ times a day.   Knee to Chest: Transverse Plane Stability   Bring one knee up, then return. Be sure pelvis does not roll side to side. Keep pelvis still. Lift knee __10_ times each leg. Restabilize pelvis. Repeat with other leg. Do _1-2__ sets, _1__ times per day.   Hip External Rotation With Pillow: Transverse Plane Stability   One knee bent, one leg straight, on pillow. Slowly roll bent knee out. Be sure pelvis does not rotate. Do _10__ times. Restabilize pelvis. Repeat with other leg. Do _1-2__ sets, _1__ times per day.   [facility] [place], [place] [address]  [phone] (office) [phone] (fax)

## 2015-10-22 NOTE — Therapy (Signed)
[place], [place], [address] Phone: [phone]   Fax:  [phone]  Physical Therapy Treatment  Patient Details  Name: Heidi Mclaughlin MRN: [id number] Date of Birth: Apr 13, 1956 Referring Provider: Dr. Helane Rima  Encounter Date: 10/22/2015      PT End of Session - 10/22/15 1359    Visit Number 4   Number of Visits 8   Date for PT Re-Evaluation 11/09/15   PT Start Time 6789   PT Stop Time 1504   PT Time Calculation (min) 66 min   Activity Tolerance Patient tolerated treatment well;No increased pain      Past Medical History  Diagnosis Date  . Allergy-induced asthma   . Hyperlipidemia   . RLS (restless legs syndrome)   . Allergy   . Arthritis   . Hypertension     Past Surgical History  Procedure Laterality Date  . Cholecystectomy  1982  . Appendectomy  1982  . Abdominal hysterectomy  2003  . Incontinence surgery  2003  . Tubal ligation  1992  . Tonsillectomy and adenoidectomy  1962    There were no vitals filed for this visit.  Visit Diagnosis:  Pain of multiple sites  Weakness of back  Abdominal weakness  Activity intolerance      Subjective Assessment - 10/22/15 1400    Subjective Pt continues to have pain in Lt hip/LE at night.  Finally at 12:15am took vicoden and was able to get sleep. Worse today because she had to do more sitting today.  Also, she states she isn't sure if her workout at gym  3 days ago, or the cold weather is making things worse.    Currently in Pain? Yes   Pain Score 4    Pain Location Buttocks   Pain Orientation Left   Pain Descriptors / Indicators Patsi Sears PT Assessment - 10/22/15 0001    Assessment   Medical Diagnosis Lt lumbar radiculopathy          [facility] Adult PT Treatment/Exercise - 10/22/15 0001    Lumbar Exercises: Stretches   ITB Stretch 2 reps;30 seconds  each leg, with strap   ITB Stretch Limitations  (followed by hip adductor stretch with strap x 2 reps x 30 sec each leg)   Piriformis Stretch 2 reps;30 seconds  supine   Lumbar Exercises: Aerobic   Stationary Bike NuStep L4: 6 min   Lumbar Exercises: Supine   Other Supine Lumbar Exercises Trans abdominal set x 10 reps, 5 sec hold (used stabilizer feedback for visual cues); TA with hip abd/add x 5 reps each leg; TA with marching x 10 reps    Other Supine Lumbar Exercises reviewed log roll; pt returned demo 5x   Lumbar Exercises: Sidelying   Clam 10 reps  2 sets each side   Lumbar Exercises: Prone   Other Prone Lumbar Exercises Pelvic press x 5 sec hold x 10 reps; with unilateral knee flexion x 10 reps each leg, pelvic press with hip ext x 10 each leg    Modalities   Modalities Traction   Electrical Stimulation   Electrical Stimulation Location --   Electrical Stimulation Action --   Electrical Stimulation Parameters --   Electrical Stimulation Goals --   Traction   Type of Traction Lumbar   Min (lbs) 25   Max (lbs) 50   Hold Time 60   Rest Time 20  Time 15  reduced time per pt request          PT Education - 10/22/15 1458    Education provided Yes   Education Details HEP- added trans abd with clam and marching.  Reviewed log rolling.  Educated on activities to avoid that increase symptoms. Handout on positions and % of increase in disc pressure.    Person(s) Educated Patient   Methods Explanation;Handout   Comprehension Returned demonstration;Verbalized understanding             PT Long Term Goals - 10/21/15 1544    PT LONG TERM GOAL #1   Title I with advanced HEP ( 11/09/15)    Time 4   Period Weeks   Status On-going   PT LONG TERM GOAL #2   Title demo strong contraction of TA and multifidi with core exercise (11/09/15)    Time 4   Period Weeks   Status On-going   PT LONG TERM GOAL #3   Title report pain decrease =/> 75% (11/09/15)    Time 4   Period Weeks   Status On-going   PT LONG TERM GOAL #4    Title return to the gym and her personal trainer without difficulty or increased pain (11/09/15)   Time 4   Period Weeks   Status On-going   PT LONG TERM GOAL #5   Title improve FOTO =/< 35% limited (11/09/15)    Time 4   Period Weeks   Status On-going               Plan - 10/22/15 1453    Clinical Impression Statement Pt tolerated exercises well, reporting reduction of symptoms with hooklying exercises.  Pt required tactile/vc for proper engagement of core muscles.  Lengthy discussion of activities to avoid to decrease symptoms/ decrease risk of injury. Pt pain free after lumbar traction.  Progressing towards goals.    Pt will benefit from skilled therapeutic intervention in order to improve on the following deficits Decreased strength;Pain;Decreased activity tolerance;Difficulty walking   Rehab Potential Excellent   PT Frequency 2x / week   PT Duration 4 weeks   PT Treatment/Interventions Traction;Ultrasound;Neuromuscular re-education;Patient/family education;Cryotherapy;Electrical Stimulation;Moist Heat;Therapeutic exercise;Manual techniques   PT Next Visit Plan continue traction, core stability    Consulted and Agree with Plan of Care Patient        Problem List Patient Active Problem List   Diagnosis Date Noted  . Upper respiratory infection, viral 10/04/2015  . Tendinitis of left extensor carpi ulnaris tendon 08/17/2015  . Trigger finger, acquired 11/17/2014  . Leg swelling 08/07/2014  . Trochanteric bursitis of left hip 01/13/2014  . Benign essential hypertension 12/29/2013  . Left lumbar radiculitis 08/07/2013  . Multiple allergies 04/24/2013  . Hyperlipidemia 04/24/2013  . Restless legs 04/24/2013  . Knee osteoarthritis 04/24/2013  . Annual physical exam 04/24/2013    Kerin Perna, PTA 10/22/2015 3:15 PM  [facility] [place], [place], [address] Phone: [phone]   Fax:   [phone]  Name: Heidi Mclaughlin MRN: [id number] Date of Birth: 04-29-1956

## 2015-10-26 ENCOUNTER — Ambulatory Visit (INDEPENDENT_AMBULATORY_CARE_PROVIDER_SITE_OTHER): Payer: BLUE CROSS/BLUE SHIELD | Admitting: Rehabilitative and Restorative Service Providers"

## 2015-10-26 ENCOUNTER — Encounter: Payer: Self-pay | Admitting: Rehabilitative and Restorative Service Providers"

## 2015-10-26 DIAGNOSIS — R6889 Other general symptoms and signs: Secondary | ICD-10-CM

## 2015-10-26 DIAGNOSIS — M6281 Muscle weakness (generalized): Secondary | ICD-10-CM | POA: Diagnosis not present

## 2015-10-26 DIAGNOSIS — R52 Pain, unspecified: Secondary | ICD-10-CM

## 2015-10-26 DIAGNOSIS — R29898 Other symptoms and signs involving the musculoskeletal system: Secondary | ICD-10-CM

## 2015-10-26 DIAGNOSIS — R198 Other specified symptoms and signs involving the digestive system and abdomen: Secondary | ICD-10-CM

## 2015-10-26 NOTE — Patient Instructions (Signed)
Piriformis Stretch   Place left foot across right leg flat of surface - keep back flat, do not twist  Lying on back, pull right knee toward opposite shoulder. Hold __30__ seconds. Repeat __3__ times. Do __3-4__ sessions per day.  Abdominal Bracing With Pelvic Floor (Hook-Lying)    With neutral spine, tighten pelvic floor and abdominals, sucking belly button to back bone; tighten muscles in the back at waist. Hold 10 sec Repeat _10  times. Do several___ times a day. Progress to do this in sitting; standing; walking; functional activities  Trunk: Prone Extension (Press-Ups)    Lie on stomach on firm, flat surface. Relax bottom and legs. Raise chest in air with elbows straight. Keep hips flat on surface, sag stomach. Hold _2-3__ seconds. Repeat _10___ times. Do _2-3___ sessions per day. CAUTION: Movement should be gentle and slow.

## 2015-10-26 NOTE — Therapy (Signed)
[place], [place], [address] Phone: [phone]   Fax:  [phone]  Physical Therapy Treatment  Patient Details  Name: Heidi Mclaughlin MRN: [id number] Date of Birth: [birth year] Referring Provider: Dr Darene Lamer  Encounter Date: 10/26/2015      PT End of Session - 10/26/15 0801    Visit Number 5   Number of Visits 8   Date for PT Re-Evaluation 11/09/15   PT Start Time 0802   PT Stop Time 0852   PT Time Calculation (min) 50 min   Activity Tolerance Patient tolerated treatment well      Past Medical History  Diagnosis Date  . Allergy-induced asthma   . Hyperlipidemia   . RLS (restless legs syndrome)   . Allergy   . Arthritis   . Hypertension     Past Surgical History  Procedure Laterality Date  . Cholecystectomy  1982  . Appendectomy  1982  . Abdominal hysterectomy  2003  . Incontinence surgery  2003  . Tubal ligation  1992  . Tonsillectomy and adenoidectomy  1962    There were no vitals filed for this visit.  Visit Diagnosis:  Pain of multiple sites  Weakness of back  Abdominal weakness  Activity intolerance      Subjective Assessment - 10/26/15 0804    Subjective Pt continues to pain in Lt hip and LE which is worse at night. She was taking pain meds every night to sleep but has not been taking it in the past four nights and did OK until last night. Restless and had some increased pain in the Lt LE. She is working out at [employer] with a trainer    Currently in Pain? Yes   Pain Score 4    Pain Location Leg   Pain Orientation Left;Lateral   Pain Descriptors / Indicators Aching;Dull   Pain Onset 1 to 4 weeks ago   Pain Frequency Intermittent   Aggravating Factors  prolonged sitting or standing    Pain Relieving Factors traction and medication             [facility] PT Assessment - 10/26/15 0001    Assessment   Medical Diagnosis Lt lumbar radiculopathy   Referring Provider Dr T    Onset Date/Surgical Date 09/28/15   Hand Dominance Right   Next MD Visit 10/25/15   Prior Therapy not for this   Strength   Overall Strength Comments Lt foot 5-/5 and knee 4+/5   Strength Assessment Site --  Lt hip abd/ext 5-/5                     [facility] Adult PT Treatment/Exercise - 10/26/15 0001    Therapeutic Activites    Therapeutic Activities --  myofacial ball release work Lt piriformis/hip    Lumbar Exercises: [employer] Ups --  2-3 sec pause x 10    ITB Stretch 2 reps;30 seconds  each leg, with strap   ITB Stretch Limitations (followed by hip adductor stretch with strap x 2 reps x 30 sec each leg)   Piriformis Stretch 2 reps;30 seconds  supine   Piriformis Stretch Limitations modified to [place] stretch Lt foot across Rt thigh flat on surface   Lumbar Exercises: Aerobic   Stationary Bike NuStep L4: 5 min   Lumbar Exercises: Supine   AB Set Limitations 3 part core 10 sec x 10    Lumbar Exercises: Prone   Other Prone  Lumbar Exercises pelvic press 5 sec x 10    Modalities   Modalities Traction   Traction   Type of Traction Lumbar  trial of static traction    Max (lbs) 50  3 steps up and 3 steps down    Time 15    Manual Therapy   Manual Therapy Soft tissue mobilization   Soft tissue mobilization deep tissue work through the [place] piriformis/hip abductors                 PT Education - 10/26/15 0824    Education provided Yes   Education Details HEP    Person(s) Educated Patient   Methods Explanation;Demonstration;Tactile cues;Verbal cues;Handout   Comprehension Verbalized understanding;Returned demonstration;Verbal cues required;Tactile cues required             PT Long Term Goals - 10/21/15 1544    PT LONG TERM GOAL #1   Title I with advanced HEP ( 11/09/15)    Time 4   Period Weeks   Status On-going   PT LONG TERM GOAL #2   Title demo strong contraction of TA and multifidi with core exercise (11/09/15)    Time 4    Period Weeks   Status On-going   PT LONG TERM GOAL #3   Title report pain decrease =/> 75% (11/09/15)    Time 4   Period Weeks   Status On-going   PT LONG TERM GOAL #4   Title return to the gym and her personal trainer without difficulty or increased pain (11/09/15)   Time 4   Period Weeks   Status On-going   PT LONG TERM GOAL #5   Title improve FOTO =/< 35% limited (11/09/15)    Time 4   Period Weeks   Status On-going               Plan - 10/26/15 0842    Clinical Impression Statement Good response to modified piriformis stretch with reproduction of Lt LE symptoms during stretch and resolution following stretch; did well with myofacial ball release work and trial of static lumbar traction. Progressing well toward stated goals of therapy with increasing Lt LE strength. Will benefit form continued treatment to resolve symptoms and progress to I HEP.    Pt will benefit from skilled therapeutic intervention in order to improve on the following deficits Decreased strength;Pain;Decreased activity tolerance;Difficulty walking   Rehab Potential Excellent   PT Duration 4 weeks   PT Treatment/Interventions Traction;Ultrasound;Neuromuscular re-education;Patient/family education;Cryotherapy;Electrical Stimulation;Moist Heat;Therapeutic exercise;Manual techniques   PT Next Visit Plan continue traction, core stability    PT Home Exercise Plan HEP modified piriformis stretch, adding prone press up and myofacial ball release work.    Consulted and Agree with Plan of Care Patient        Problem List Patient Active Problem List   Diagnosis Date Noted  . Upper respiratory infection, viral 10/04/2015  . Tendinitis of left extensor carpi ulnaris tendon 08/17/2015  . Trigger finger, acquired 11/17/2014  . Leg swelling 08/07/2014  . Trochanteric bursitis of left hip 01/13/2014  . Benign essential hypertension 12/29/2013  . Left lumbar radiculitis 08/07/2013  . Multiple allergies  04/24/2013  . Hyperlipidemia 04/24/2013  . Restless legs 04/24/2013  . Knee osteoarthritis 04/24/2013  . Annual physical exam 04/24/2013    Keysha Damewood Nilda Simmer PT, MPH  10/26/2015, 8:47 AM  [facility] [place], [place], [address] Phone: [phone]   Fax:  [phone]  Name:  Kristi Norment MRN: [id number] Date of Birth: [birth year]

## 2015-10-28 ENCOUNTER — Other Ambulatory Visit: Payer: Self-pay | Admitting: Sports Medicine

## 2015-10-28 ENCOUNTER — Ambulatory Visit (INDEPENDENT_AMBULATORY_CARE_PROVIDER_SITE_OTHER): Payer: BLUE CROSS/BLUE SHIELD | Admitting: Physical Therapy

## 2015-10-28 DIAGNOSIS — R6889 Other general symptoms and signs: Secondary | ICD-10-CM | POA: Diagnosis not present

## 2015-10-28 DIAGNOSIS — R52 Pain, unspecified: Secondary | ICD-10-CM | POA: Diagnosis not present

## 2015-10-28 DIAGNOSIS — R198 Other specified symptoms and signs involving the digestive system and abdomen: Secondary | ICD-10-CM

## 2015-10-28 DIAGNOSIS — M6281 Muscle weakness (generalized): Secondary | ICD-10-CM

## 2015-10-28 DIAGNOSIS — R29898 Other symptoms and signs involving the musculoskeletal system: Secondary | ICD-10-CM

## 2015-10-28 NOTE — Therapy (Signed)
[place], [place], [address] Phone: [phone]   Fax:  [phone]  Physical Therapy Treatment  Patient Details  Name: Heidi Mclaughlin MRN: [id number] Date of Birth: 03-21-1956 Referring Provider: Dr Darene Lamer  Encounter Date: 10/28/2015      PT End of Session - 10/28/15 1405    Visit Number 6   Number of Visits 8   Date for PT Re-Evaluation 11/09/15   PT Start Time 4825   PT Stop Time 1502   PT Time Calculation (min) 58 min   Activity Tolerance Patient tolerated treatment well      Past Medical History  Diagnosis Date  . Allergy-induced asthma   . Hyperlipidemia   . RLS (restless legs syndrome)   . Allergy   . Arthritis   . Hypertension     Past Surgical History  Procedure Laterality Date  . Cholecystectomy  1982  . Appendectomy  1982  . Abdominal hysterectomy  2003  . Incontinence surgery  2003  . Tubal ligation  1992  . Tonsillectomy and adenoidectomy  1962    There were no vitals filed for this visit.  Visit Diagnosis:  Pain of multiple sites  Weakness of back  Abdominal weakness  Activity intolerance      Subjective Assessment - 10/28/15 1406    Subjective Pt reports she hasn' t had to take pain medicine last 2 days, but is taking muscle relaxers last couple nights and has slept a little better.  Still has pain in Lt llateral calf 0.5-1.0/ 10 during day and up to 9/10 in middle of night.  Night time pain has been biggest complaint.    Currently in Pain? Yes   Pain Score 1    Pain Location Leg   Pain Orientation Left;Lateral   Pain Descriptors / Indicators Aching            [facility] PT Assessment - 10/28/15 0001    Assessment   Medical Diagnosis Lt lumbar radiculopathy   Onset Date/Surgical Date 09/28/15   Hand Dominance Right   Next MD Visit 11/01/15          [facility] Adult PT Treatment/Exercise - 10/28/15 0001    Lumbar Exercises: Stretches   Press Ups --  2-3  sec pause x 10    ITB Stretch 2 reps;30 seconds  each leg, with strap   ITB Stretch Limitations (followed by hip adductor stretch with strap x 2 reps x 30 sec each leg)   Piriformis Stretch 2 reps;30 seconds  supine   Piriformis Stretch Limitations modified to Travell stretch Lt foot across Rt thigh flat on surface   Lumbar Exercises: Aerobic   Stationary Bike NuStep L5: 5 min    Lumbar Exercises: Supine   AB Set Limitations 3 part core 10 sec x 10    Lumbar Exercises: Prone   Other Prone Lumbar Exercises Pelvic press x 5 sec hold x 10 reps; with unilateral knee flexion x 10 reps each leg, pelvic press with hip ext x 10 each leg    Modalities   Modalities Traction   Traction   Type of Traction Lumbar   Max (lbs) 50  3 steps up and 3 steps down    Time 15   Manual Therapy   Manual Therapy Myofascial release   Soft tissue mobilization deep tissue work through the Rt and Lt piriformis/hip abductors             PT Long Term  Goals - 10/28/15 1420    PT LONG TERM GOAL #1   Title I with advanced HEP ( 11/09/15)    Time 4   Period Weeks   Status On-going   PT LONG TERM GOAL #2   Title demo strong contraction of TA and multifidi with core exercise (11/09/15)    Time 4   Period Weeks   Status On-going   PT LONG TERM GOAL #3   Title report pain decrease =/> 75% (11/09/15)    Time 4   Period Weeks   Status On-going  reports 50% improvement   PT LONG TERM GOAL #4   Title return to the gym and her personal trainer without difficulty or increased pain (11/09/15)   Time 4   Period Weeks   Status On-going  has made modifications with trainer   PT LONG TERM GOAL #5   Title improve FOTO =/< 35% limited (11/09/15)    Time 4   Period Weeks   Status On-going               Plan - 10/28/15 1500    Clinical Impression Statement Pt demonstrated improved performance of piriformis stretch.  Pt is able to demonstrate strong multifidi contraction, but still struggles with TA  contraction during core work; partially met LTG#2.  50% improvement of symptoms; now mostly only in pain at night instead of continuous. Improved FOTO score to 46%.  Pt will benefit from continued PT intervention to decrease pain and imrove functional mobility.    Pt will benefit from skilled therapeutic intervention in order to improve on the following deficits Decreased strength;Pain;Decreased activity tolerance;Difficulty walking   Rehab Potential Excellent   PT Frequency 2x / week   PT Duration 4 weeks   PT Treatment/Interventions Traction;Ultrasound;Neuromuscular re-education;Patient/family education;Cryotherapy;Electrical Stimulation;Moist Heat;Therapeutic exercise;Manual techniques   PT Next Visit Plan continue traction, core stability    Consulted and Agree with Plan of Care Patient        Problem List Patient Active Problem List   Diagnosis Date Noted  . Upper respiratory infection, viral 10/04/2015  . Tendinitis of left extensor carpi ulnaris tendon 08/17/2015  . Trigger finger, acquired 11/17/2014  . Leg swelling 08/07/2014  . Trochanteric bursitis of left hip 01/13/2014  . Benign essential hypertension 12/29/2013  . Left lumbar radiculitis 08/07/2013  . Multiple allergies 04/24/2013  . Hyperlipidemia 04/24/2013  . Restless legs 04/24/2013  . Knee osteoarthritis 04/24/2013  . Annual physical exam 04/24/2013    Kerin Perna, PTA 10/28/2015 3:14 PM  [facility] [place], [place], [address] Phone: [phone]   Fax:  [phone]  Name: Deari Sessler MRN: [id number] Date of Birth: Jul 02, 1956

## 2015-10-29 MED ORDER — CYCLOBENZAPRINE HCL 10 MG PO TABS: 10.0000 mg | ORAL_TABLET | Freq: Three times a day (TID) | ORAL | Status: DC | PRN

## 2015-11-01 ENCOUNTER — Ambulatory Visit (INDEPENDENT_AMBULATORY_CARE_PROVIDER_SITE_OTHER): Payer: BLUE CROSS/BLUE SHIELD | Admitting: Sports Medicine

## 2015-11-01 VITALS — BP 126/82 | HR 80 | Temp 98.0°F | Resp 18 | Wt 144.7 lb

## 2015-11-01 DIAGNOSIS — M4316 Spondylolisthesis, lumbar region: Secondary | ICD-10-CM | POA: Diagnosis not present

## 2015-11-01 NOTE — Progress Notes (Signed)
  Subjective:    CC: Follow-up  HPI: Left lumbar radiculopathy: X-ray showed mild grade 1 spondylolisthesis of L5 on S1, she has now finished physical therapy, and is significantly better. Happy with how things are going.  Past medical history, Surgical history, Family history not pertinant except as noted below, Social history, Allergies, and medications have been entered into the medical record, reviewed, and no changes needed.   Review of Systems: No fevers, chills, night sweats, weight loss, chest pain, or shortness of breath.   Objective:    General: Well Developed, well nourished, and in no acute distress.  Neuro: Alert and oriented x3, extra-ocular muscles intact, sensation grossly intact.  HEENT: Normocephalic, atraumatic, pupils equal round reactive to light, neck supple, no masses, no lymphadenopathy, thyroid nonpalpable.  Skin: Warm and dry, no rashes. Cardiac: Regular rate and rhythm, no murmurs rubs or gallops, no lower extremity edema.  Respiratory: Clear to auscultation bilaterally. Not using accessory muscles, speaking in full sentences.  Impression and Recommendations:

## 2015-11-01 NOTE — Assessment & Plan Note (Signed)
Previous left-sided L5 versus S1 radiculopathy has resolved with aggressive formal physical therapy. Return as needed.  Continue to work aggressively on core stabilization in the gym.

## 2015-11-02 ENCOUNTER — Ambulatory Visit (INDEPENDENT_AMBULATORY_CARE_PROVIDER_SITE_OTHER): Payer: BLUE CROSS/BLUE SHIELD | Admitting: Physical Therapy

## 2015-11-02 DIAGNOSIS — R198 Other specified symptoms and signs involving the digestive system and abdomen: Secondary | ICD-10-CM

## 2015-11-02 DIAGNOSIS — R6889 Other general symptoms and signs: Secondary | ICD-10-CM | POA: Diagnosis not present

## 2015-11-02 DIAGNOSIS — R29898 Other symptoms and signs involving the musculoskeletal system: Secondary | ICD-10-CM

## 2015-11-02 DIAGNOSIS — R52 Pain, unspecified: Secondary | ICD-10-CM | POA: Diagnosis not present

## 2015-11-02 DIAGNOSIS — M6281 Muscle weakness (generalized): Secondary | ICD-10-CM | POA: Diagnosis not present

## 2015-11-02 NOTE — Therapy (Signed)
[place], [place], [address] Phone: [phone]   Fax:  [phone]  Physical Therapy Treatment  Patient Details  Name: Heidi Mclaughlin MRN: [id number] Date of Birth: 1956-05-13 Referring Provider: Dr Darene Lamer  Encounter Date: 11/02/2015      PT End of Session - 11/02/15 0936    Visit Number 7   Number of Visits 8   Date for PT Re-Evaluation 11/09/15   PT Start Time 0931   PT Stop Time 1013   PT Time Calculation (min) 42 min   Activity Tolerance Patient tolerated treatment well;No increased pain      Past Medical History  Diagnosis Date  . Allergy-induced asthma   . Hyperlipidemia   . RLS (restless legs syndrome)   . Allergy   . Arthritis   . Hypertension     Past Surgical History  Procedure Laterality Date  . Cholecystectomy  1982  . Appendectomy  1982  . Abdominal hysterectomy  2003  . Incontinence surgery  2003  . Tubal ligation  1992  . Tonsillectomy and adenoidectomy  1962    There were no vitals filed for this visit.  Visit Diagnosis:  Pain of multiple sites  Weakness of back  Abdominal weakness  Activity intolerance      Subjective Assessment - 11/02/15 0934    Subjective Pt saw MD and she states he has released her.  She feels like she turned a corner last wk.  Did a yoga class last night, went well and she had no production of symptoms.     Currently in Pain? Yes   Pain Score 1    Pain Location Leg   Pain Orientation Left;Lateral   Pain Descriptors / Indicators Aching   Aggravating Factors  prolonged sitting, standing, sleeping at night   Pain Relieving Factors traction, medicine.             [facility] PT Assessment - 11/02/15 0001    Assessment   Medical Diagnosis Lt lumbar radiculopathy   Onset Date/Surgical Date 09/28/15   Next MD Visit PRN           [facility] Adult PT Treatment/Exercise - 11/02/15 0001    Self-Care   Self-Care Other Self-Care Comments   Other Self-Care Comments  pt re-educated on log roll, returned demo 2x.  Pt educated on positions/postures to avoid to decrease risk of re-injury.     Exercises   Exercises Knee/Hip;Lumbar   Lumbar Exercises: Stretches   ITB Stretch Limitations trial standing, poor form stopped    Piriformis Stretch 2 reps;30 seconds  supine   Lumbar Exercises: Aerobic   Stationary Bike NuStep L4: 6 min    Lumbar Exercises: Standing   Wall Slides 10 reps  10 sec. VC for even wt in both legs, core engaged.    Other Standing Lumbar Exercises hip hinge squat with bar at hip flexors x 8 reps (to encourage straght back.)  Miroor for visual feedback   Lumbar Exercises: Supine   Bridge 10 reps;5 seconds  feet on ball   Bridge Limitations single leg bridge x 10 reps each leg    Other Supine Lumbar Exercises Pilates 100 broken down to 8 sets of 10, VC to breathe.  Pilates table top heel taps x 10 reps; Pilates supine leg circles x 15 each leg.    Lumbar Exercises: Sidelying   Other Sidelying Lumbar Exercises side plank modified x 10 sec x 2 reps each side  Lumbar Exercises: Prone   Other Prone Lumbar Exercises Plank x 15 sec    Knee/Hip Exercises: Stretches   Passive Hamstring Stretch Right;Left;2 reps;30 seconds   Gastroc Stretch Right;Left;2 reps;30 seconds           PT Long Term Goals - 11/02/15 1010    PT LONG TERM GOAL #1   Title I with advanced HEP ( 11/09/15)    Time 4   Period Weeks   Status On-going   PT LONG TERM GOAL #2   Title demo strong contraction of TA and multifidi with core exercise (11/09/15)    Time 4   Period Weeks   Status On-going   PT LONG TERM GOAL #3   Title report pain decrease =/> 75% (11/09/15)    Time 4   Period Weeks   Status Achieved   PT LONG TERM GOAL #4   Title return to the gym and her personal trainer without difficulty or increased pain (11/09/15)   Time 4   Period Weeks   Status On-going   PT LONG TERM GOAL #5   Title improve FOTO =/< 35% limited  (11/09/15)    Time 4   Period Weeks   Status On-going               Plan - 11/02/15 1013    Clinical Impression Statement Pt tolerated all exercises without increase in symptoms.  Pt reported elimination of symptoms by end of session with exercise.  Traction held this date. Pt has met LTG #3 and is making great progress towards remaining goals.    Pt will benefit from skilled therapeutic intervention in order to improve on the following deficits Decreased strength;Pain;Decreased activity tolerance;Difficulty walking   Rehab Potential Excellent   PT Frequency 2x / week   PT Duration 4 weeks   PT Treatment/Interventions Traction;Ultrasound;Neuromuscular re-education;Patient/family education;Cryotherapy;Electrical Stimulation;Moist Heat;Therapeutic exercise;Manual techniques   PT Next Visit Plan Assess progress and need for further therapy vs d/c to HEP.    Consulted and Agree with Plan of Care Patient        Problem List Patient Active Problem List   Diagnosis Date Noted  . Upper respiratory infection, viral 10/04/2015  . Tendinitis of left extensor carpi ulnaris tendon 08/17/2015  . Trigger finger, acquired 11/17/2014  . Leg swelling 08/07/2014  . Trochanteric bursitis of left hip 01/13/2014  . Benign essential hypertension 12/29/2013  . Spondylolisthesis of lumbar region 08/07/2013  . Multiple allergies 04/24/2013  . Hyperlipidemia 04/24/2013  . Restless legs 04/24/2013  . Knee osteoarthritis 04/24/2013  . Annual physical exam 04/24/2013   Kerin Perna, PTA 11/02/2015 10:20 AM  [place], [place], [address] Phone: [phone]   Fax:  [phone]  Name: Heidi Mclaughlin MRN: [id number] Date of Birth: 1956/11/04

## 2015-11-04 ENCOUNTER — Encounter: Payer: Self-pay | Admitting: Rehabilitative and Restorative Service Providers"

## 2015-11-04 ENCOUNTER — Ambulatory Visit (INDEPENDENT_AMBULATORY_CARE_PROVIDER_SITE_OTHER): Payer: BLUE CROSS/BLUE SHIELD | Admitting: Rehabilitative and Restorative Service Providers"

## 2015-11-04 DIAGNOSIS — R6889 Other general symptoms and signs: Secondary | ICD-10-CM | POA: Diagnosis not present

## 2015-11-04 DIAGNOSIS — R198 Other specified symptoms and signs involving the digestive system and abdomen: Secondary | ICD-10-CM | POA: Diagnosis not present

## 2015-11-04 DIAGNOSIS — R52 Pain, unspecified: Secondary | ICD-10-CM

## 2015-11-04 DIAGNOSIS — R29898 Other symptoms and signs involving the musculoskeletal system: Secondary | ICD-10-CM

## 2015-11-04 DIAGNOSIS — M6281 Muscle weakness (generalized): Secondary | ICD-10-CM | POA: Diagnosis not present

## 2015-11-04 NOTE — Therapy (Signed)
[place] [address] [place], [place], [address] Phone: [phone]   Fax:  [phone]  Physical Therapy Treatment  Patient Details  Name: Heidi Mclaughlin MRN: [id number] Date of Birth: [birth year] Referring Provider: Dr Darene Lamer  Encounter Date: 11/04/2015      PT End of Session - 11/04/15 1026    Visit Number 8   Number of Visits 8   Date for PT Re-Evaluation 11/09/15   PT Start Time 0938   PT Stop Time 1026   PT Time Calculation (min) 48 min   Activity Tolerance Patient tolerated treatment well      Past Medical History  Diagnosis Date  . Allergy-induced asthma   . Hyperlipidemia   . RLS (restless legs syndrome)   . Allergy   . Arthritis   . Hypertension     Past Surgical History  Procedure Laterality Date  . Cholecystectomy  1982  . Appendectomy  1982  . Abdominal hysterectomy  2003  . Incontinence surgery  2003  . Tubal ligation  1992  . Tonsillectomy and adenoidectomy  1962    There were no vitals filed for this visit.  Visit Diagnosis:  Pain of multiple sites  Weakness of back  Abdominal weakness  Activity intolerance      Subjective Assessment - 11/04/15 0945    Subjective No longer using medication of any kind. Went to yoga class twice. Feeling much better.    Currently in Pain? No/denies                         [facility] Adult PT Treatment/Exercise - 11/04/15 0001    Lumbar Exercises: Stretches   Press Ups --  2-3 sec pause x 10 sag at end range    ITB Stretch 2 reps;30 seconds   Piriformis Stretch 2 reps;30 seconds  supine   Lumbar Exercises: Aerobic   Stationary Bike NuStep L5: 6 min    Lumbar Exercises: Standing   Wall Slides 10 reps  10 sec. VC for even wt in both legs, core engaged.    Lumbar Exercises: Supine   AB Set Limitations 3 part core 10 sec x 10    Bridge 10 reps;5 seconds  feet on ball   Knee/Hip Exercises: Stretches   Passive Hamstring Stretch  Right;Left;2 reps;30 seconds   Piriformis Stretch 3 reps;30 seconds   Piriformis Stretch Limitations diagonal knee to chest in various angles 30 sec hold x 3    Gastroc Stretch Right;Left;2 reps;30 seconds                PT Education - 11/04/15 1022    Education provided Yes   Education Details myofacial ball release work; HEP; Customer service manager) Educated Patient   Methods Explanation;Demonstration;Tactile cues;Verbal cues;Handout   Comprehension Verbalized understanding;Returned demonstration;Verbal cues required;Tactile cues required             PT Long Term Goals - 11/04/15 1026    PT LONG TERM GOAL #1   Title I with advanced HEP ( 11/09/15)    Time 4   Period Weeks   Status Achieved   PT LONG TERM GOAL #2   Title demo strong contraction of TA and multifidi with core exercise (11/09/15)    Time 4   Period Weeks   Status Achieved   PT LONG TERM GOAL #3   Title report pain decrease =/> 75% (11/09/15)    Time 4   Period  Weeks   Status Achieved   PT LONG TERM GOAL #4   Title return to the gym and her personal trainer without difficulty or increased pain (11/09/15)   Time 4   Period Weeks   Status Achieved   PT LONG TERM GOAL #5   Title improve FOTO =/< 35% limited (11/09/15)    Time 4   Period Weeks   Status Achieved               Plan - 11/04/15 0948    Clinical Impression Statement Patient is progressing well. Pleased with progress and confident in continuing with I HEP. Saw Dr. Darene Lamer this week and he is OK with patient continuing I home program. She will continue with her personal trainer and use the ball for myofacial release.    Pt will benefit from skilled therapeutic intervention in order to improve on the following deficits Decreased strength;Pain;Decreased activity tolerance;Difficulty walking   Rehab Potential Excellent   PT Frequency 2x / week   PT Duration 4 weeks   PT Treatment/Interventions Traction;Ultrasound;Neuromuscular  re-education;Patient/family education;Cryotherapy;Electrical Stimulation;Moist Heat;Therapeutic exercise;Manual techniques   PT Next Visit Plan D/c to I HEP    PT Home Exercise Plan HEP modified piriformis stretch, adding prone press up and myofacial ball release work.    Consulted and Agree with Plan of Care Patient        Problem List Patient Active Problem List   Diagnosis Date Noted  . Upper respiratory infection, viral 10/04/2015  . Tendinitis of left extensor carpi ulnaris tendon 08/17/2015  . Trigger finger, acquired 11/17/2014  . Leg swelling 08/07/2014  . Trochanteric bursitis of left hip 01/13/2014  . Benign essential hypertension 12/29/2013  . Spondylolisthesis of lumbar region 08/07/2013  . Multiple allergies 04/24/2013  . Hyperlipidemia 04/24/2013  . Restless legs 04/24/2013  . Knee osteoarthritis 04/24/2013  . Annual physical exam 04/24/2013    Everardo All PT,MPH  11/04/2015, 10:28 AM  [facility] [place], [place], [address] Phone: [phone]   Fax:  [phone]  Name: Heidi Mclaughlin MRN: [id number] Date of Birth: 10/27/1956  PHYSICAL THERAPY DISCHARGE SUMMARY  Visits from Start of Care: 8  Current functional level related to goals / functional outcomes: I in HEP and returned to all normal functional activities    Remaining deficits: Mild tightness    Education / Equipment: HEP  Plan: Patient agrees to discharge.  Patient goals were met. Patient is being discharged due to meeting the stated rehab goals.  ?????   Celyn P. Helene Kelp PT, MPH 11/04/2015 10:29 AM

## 2015-11-04 NOTE — Patient Instructions (Signed)
Avoid bent forward and twisted positions.  Engage core with everything you do and pair core exercise with some things you do on a frequent basis each day  ---like driving; brushing your teeth; working in the kitchen; and especially with any exercises you do including walking on the treadmill  Diagonal knee to chest stretch in various angles  Trunk Flexion    Standing on one leg, bend forward from hips. Touch chair, keeping core engaged and back straight; then return, keeping back straight and leg bent. Hold each position _5-10___ seconds. Repeat on other leg. Do __5-10__ repetitions

## 2015-12-07 ENCOUNTER — Ambulatory Visit (INDEPENDENT_AMBULATORY_CARE_PROVIDER_SITE_OTHER): Payer: BLUE CROSS/BLUE SHIELD | Admitting: Sports Medicine

## 2015-12-07 ENCOUNTER — Other Ambulatory Visit: Payer: Self-pay | Admitting: Sports Medicine

## 2015-12-07 VITALS — BP 114/78 | HR 83 | Temp 98.0°F | Resp 16 | Wt 147.6 lb

## 2015-12-07 DIAGNOSIS — K644 Residual hemorrhoidal skin tags: Secondary | ICD-10-CM | POA: Insufficient documentation

## 2015-12-07 DIAGNOSIS — K648 Other hemorrhoids: Secondary | ICD-10-CM | POA: Diagnosis not present

## 2015-12-07 MED ORDER — WITCH HAZEL-GLYCERIN EX PADS
1.0000 | MEDICATED_PAD | CUTANEOUS | Status: DC | PRN
Start: 2015-12-07 — End: 2018-07-10

## 2015-12-07 MED ORDER — HYDROCORTISONE ACETATE 25 MG RE SUPP: 25.0000 mg | Freq: Two times a day (BID) | RECTAL | Status: DC | PRN

## 2015-12-07 NOTE — Patient Instructions (Signed)

## 2015-12-07 NOTE — Progress Notes (Signed)
  Subjective:    CC: Hemorrhoids  HPI: For the past 3 days this pleasant 60 year old female has had a minimally tender nodule around her rectum, she does have a history of hemorrhoids.  No hemorrhage. Symptoms are predominantly itching. She eats a high-fiber diet and tells me her stools are sufficiently soft. Symptoms are moderate, persistent.  Past medical history, Surgical history, Family history not pertinant except as noted below, Social history, Allergies, and medications have been entered into the medical record, reviewed, and no changes needed.   Review of Systems: No fevers, chills, night sweats, weight loss, chest pain, or shortness of breath.   Objective:    General: Well Developed, well nourished, and in no acute distress.  Neuro: Alert and oriented x3, extra-ocular muscles intact, sensation grossly intact.  HEENT: Normocephalic, atraumatic, pupils equal round reactive to light, neck supple, no masses, no lymphadenopathy, thyroid nonpalpable.  Skin: Warm and dry, no rashes. Cardiac: Regular rate and rhythm, no murmurs rubs or gallops, no lower extremity edema.  Respiratory: Clear to auscultation bilaterally. Not using accessory muscles, speaking in full sentences.  Impression and Recommendations:

## 2015-12-07 NOTE — Assessment & Plan Note (Signed)
Symptoms have been present for 3 days now with severe itching. Continue increased fiber, adding witch hazel wipes and Anusol HC suppositories.  Return if no better in a couple of weeks.

## 2016-01-03 ENCOUNTER — Ambulatory Visit (INDEPENDENT_AMBULATORY_CARE_PROVIDER_SITE_OTHER): Payer: BLUE CROSS/BLUE SHIELD | Admitting: Sports Medicine

## 2016-01-03 DIAGNOSIS — S63095D Other dislocation of left wrist and hand, subsequent encounter: Secondary | ICD-10-CM

## 2016-01-03 DIAGNOSIS — S63092D Other subluxation of left wrist and hand, subsequent encounter: Secondary | ICD-10-CM

## 2016-01-03 NOTE — Progress Notes (Signed)
  Subjective:    CC:  Wrist pain  HPI: This is a pleasant 60 year old female, we injected her extensor carpi ulnaris tendon sheath sometime ago, she had a good response, unfortunately is having recurrence of pain. Pain is moderate, persistent localized over the ulnar aspect of the wrist with radiation into the medal hand.  Past medical history, Surgical history, Family history not pertinant except as noted below, Social history, Allergies, and medications have been entered into the medical record, reviewed, and no changes needed.   Review of Systems: No fevers, chills, night sweats, weight loss, chest pain, or shortness of breath.   Objective:    General: Well Developed, well nourished, and in no acute distress.  Neuro: Alert and oriented x3, extra-ocular muscles intact, sensation grossly intact.  HEENT: Normocephalic, atraumatic, pupils equal round reactive to light, neck supple, no masses, no lymphadenopathy, thyroid nonpalpable.  Skin: Warm and dry, no rashes. Cardiac: Regular rate and rhythm, no murmurs rubs or gallops, no lower extremity edema.  Respiratory: Clear to auscultation bilaterally. Not using accessory muscles, speaking in full sentences. Left Wrist: Visibly swollen over the ulnar wrist ROM smooth and normal with good flexion and extension and ulnar/radial deviation that is symmetrical with opposite wrist. Tender to palpation over the TFCC and the extensor carpi ulnaris No snuffbox tenderness. No tenderness over Canal of [place]. Strength 5/5 in all directions without pain. Negative Finkelstein, tinel's and phalens. Negative Watson's test.  Procedure: Real-time Ultrasound Guided Injection of Left sixth extensor compartment Device: GE Logiq E  Verbal informed consent obtained.  Time-out conducted.  Noted no overlying erythema, induration, or other signs of local infection.  Skin prepped in a sterile fashion.  Local anesthesia: Topical Ethyl chloride.  With sterile  technique and under real time ultrasound guidance:  Noted heterogeneity of the left extensor carpi ulnaris tendon, there is also significant fluid in the tendon sheath itself as well as protruding from the TFCC. 25-gauge needle advanced here and 1 mL kenalog 40, 1 mL lidocaine injected easily. Completed without difficulty  Pain immediately resolved suggesting accurate placement of the medication.  Advised to call if fevers/chills, erythema, induration, drainage, or persistent bleeding.  Images permanently stored and available for review in the ultrasound unit.  Impression: Technically successful ultrasound guided injection.  Impression and Recommendations:

## 2016-01-03 NOTE — Assessment & Plan Note (Signed)
Recurrence of pain in the sixth extensor compartment of the left wrist, repeat sixth extensor compartment injection as above. There does appear to be some heterogeneity of the extensor carpi ulnaris tendon, and I am concerned about rupture and retraction. Wrist MRI.

## 2016-01-06 ENCOUNTER — Encounter: Payer: Self-pay | Admitting: Sports Medicine

## 2016-01-06 ENCOUNTER — Ambulatory Visit (INDEPENDENT_AMBULATORY_CARE_PROVIDER_SITE_OTHER): Payer: BLUE CROSS/BLUE SHIELD | Admitting: Sports Medicine

## 2016-01-06 VITALS — BP 137/89 | HR 64 | Temp 99.2°F | Resp 18 | Wt 147.0 lb

## 2016-01-06 DIAGNOSIS — J069 Acute upper respiratory infection, unspecified: Secondary | ICD-10-CM | POA: Diagnosis not present

## 2016-01-06 LAB — POCT INFLUENZA A/B
Influenza A, POC: NEGATIVE
Influenza B, POC: NEGATIVE

## 2016-01-06 NOTE — Assessment & Plan Note (Signed)
Benign exam, negative influenza test. This is a cold, use over-the-counter cold and flu medications. Return if no better in 2 weeks.

## 2016-01-06 NOTE — Progress Notes (Signed)
  Subjective:    CC: "Fatigue and Cough"  HPI: Patient is a 60 year old female that presents with malaise, fever, chills, fatigue, cough productive for clear sputum, chest tightness and sore throat for one day. Patient has sick contacts but denies recent travel. Patient denies nausea, vomiting, abdominal pain, constipation, diarrhea, shortness of breath, chest pain, hematuria, dysuria, constipation and diarrhea. Patient is apprehensive that she has the flu. Patient states that she has had a fever but did not take her temperature.. Patient has tried Mucinex, Zyrtec and Vicks Vapor Rub, which has not relieved her symptoms.    Past medical history, Surgical history, Family history not pertinant except as noted below, Social history, Allergies, and medications have been entered into the medical record, reviewed, and no changes needed.   Review of Systems: No fevers, chills, night sweats, weight loss, chest pain, or shortness of breath.   Objective:    General: Well Developed, well nourished, and in no acute distress.  Neuro: Alert and oriented x3, extra-ocular muscles intact, sensation grossly intact.  HEENT: Normocephalic, atraumatic, pupils equal round reactive to light, neck supple, no masses, no lymphadenopathy, thyroid nonpalpable.  Skin: Warm and dry, no rashes. Cardiac: Regular rate and rhythm, no murmurs rubs or gallops, no lower extremity edema.  Respiratory: Clear to auscultation bilaterally. Not using accessory muscles, speaking in full sentences. Abdomen: Patient's abdomen is soft and non-tender to palpation. Patient has no guarding, rebound tenderness or rigidity. Patient has positive bowel sounds in all four quadrants.    Impression and Recommendations:    1. Upper Respiratory Tract Infection, likely viral Patient's in office rapid influenza test was negative. Patient was advised to stay hydrated and to get plenty of rest. Patient was advised to follow up if symptoms persist.

## 2016-01-10 ENCOUNTER — Ambulatory Visit (INDEPENDENT_AMBULATORY_CARE_PROVIDER_SITE_OTHER): Payer: BLUE CROSS/BLUE SHIELD

## 2016-01-10 DIAGNOSIS — S63092D Other subluxation of left wrist and hand, subsequent encounter: Secondary | ICD-10-CM

## 2016-01-10 DIAGNOSIS — M65832 Other synovitis and tenosynovitis, left forearm: Secondary | ICD-10-CM

## 2016-01-23 ENCOUNTER — Other Ambulatory Visit: Payer: Self-pay | Admitting: Sports Medicine

## 2016-02-01 ENCOUNTER — Ambulatory Visit (INDEPENDENT_AMBULATORY_CARE_PROVIDER_SITE_OTHER): Payer: BLUE CROSS/BLUE SHIELD | Admitting: Sports Medicine

## 2016-02-01 ENCOUNTER — Encounter: Payer: Self-pay | Admitting: Sports Medicine

## 2016-02-01 ENCOUNTER — Ambulatory Visit: Payer: BLUE CROSS/BLUE SHIELD | Admitting: Sports Medicine

## 2016-02-01 VITALS — BP 129/89 | HR 73 | Resp 18 | Wt 147.5 lb

## 2016-02-01 DIAGNOSIS — S63095D Other dislocation of left wrist and hand, subsequent encounter: Secondary | ICD-10-CM

## 2016-02-01 DIAGNOSIS — L84 Corns and callosities: Secondary | ICD-10-CM | POA: Diagnosis not present

## 2016-02-01 DIAGNOSIS — S63092D Other subluxation of left wrist and hand, subsequent encounter: Secondary | ICD-10-CM

## 2016-02-01 NOTE — Progress Notes (Signed)
  Subjective:    CC: follow-up  HPI: Left wrist: MRI showed extensive PCU tendon interstitial tearing, even in her brace she continues to have pain. No better.  Lesion on the foot: Right-sided, plantar heel. Painful with weightbearing.  Past medical history, Surgical history, Family history not pertinant except as noted below, Social history, Allergies, and medications have been entered into the medical record, reviewed, and no changes needed.   Review of Systems: No fevers, chills, night sweats, weight loss, chest pain, or shortness of breath.   Objective:    General: Well Developed, well nourished, and in no acute distress.  Neuro: Alert and oriented x3, extra-ocular muscles intact, sensation grossly intact.  HEENT: Normocephalic, atraumatic, pupils equal round reactive to light, neck supple, no masses, no lymphadenopathy, thyroid nonpalpable.  Skin: Warm and dry, no rashes.there is a corn on the right heel. Cardiac: Regular rate and rhythm, no murmurs rubs or gallops, no lower extremity edema.  Respiratory: Clear to auscultation bilaterally. Not using accessory muscles, speaking in full sentences.  Procedure:  Excision of  Right heel corn Risks, benefits, and alternatives explained and consent obtained. Time out conducted. Excision performed with: #15 blade used to core out the lesion. Hemostasis achieved. Pt stable.  Impression and Recommendations:

## 2016-02-01 NOTE — Assessment & Plan Note (Signed)
Surgical excision of the corn under the foot today.

## 2016-02-01 NOTE — Patient Instructions (Signed)
Corns and Calluses Corns are small areas of thickened skin that occur on the top, sides, or tip of a toe. They contain a cone-shaped core with a point that can press on a nerve below. This causes pain. Calluses are areas of thickened skin that can occur anywhere on the body including hands, fingers, palms, soles of the feet, and heels.Calluses are usually larger than corns.  CAUSES  Corns and calluses are caused by rubbing (friction) or pressure, such as from shoes that are too tight or do not fit properly.  RISK FACTORS Corns are more likely to develop in people who have toe deformities, such as hammer toes. Since calluses can occur with friction to any area of the skin, calluses are more likely to develop in people who:   Work with their hands.  Wear shoes that fit poorly, shoes that are too tight, or shoes that are high-heeled.  Have toes deformities. SYMPTOMS Symptoms of a corn or callus include:  A hard growth on the skin.   Pain or tenderness under the skin.   Redness and swelling.   Increased discomfort while wearing tight-fitting shoes. DIAGNOSIS  Corns and calluses may be diagnosed with a medical history and physical exam.  TREATMENT  Corns and calluses may be treated with:  Removing the cause of the friction or pressure. This may include:  Changing your shoes.  Wearing shoe inserts (orthotics) or other protective layers in your shoes, such as a corn pad.  Wearing gloves.  Medicines to help soften skin in the hardened, thickened areas.  Reducing the size of the corn or callus by removing the dead layers of skin.  Antibiotic medicines to treat infection.  Surgery, if a toe deformity is the cause. HOME CARE INSTRUCTIONS   Take medicines only as directed by your health care provider.  If you were prescribed an antibiotic, finish all of it even if you start to feel better.  Wear shoes that fit well. Avoid wearing high-heeled shoes and shoes that are too tight  or too loose.  Wear any padding, protective layers, gloves, or orthotics as directed by your health care provider.  Soak your hands or feet and then use a file or pumice stone to soften your corn or callus. Do this as directed by your health care provider.  Check your corn or callus every day for signs of infection. Watch for:  Redness, swelling, or pain.  Fluid, blood, or pus. SEEK MEDICAL CARE IF:   Your symptoms do not improve with treatment.  You have increased redness, swelling, or pain at the site of your corn or callus.  You have fluid, blood, or pus coming from your corn or callus.  You have new symptoms.   This information is not intended to replace advice given to you by your health care provider. Make sure you discuss any questions you have with your health care provider.   Document Released: 08/05/2004 Document Revised: 03/16/2015 Document Reviewed: 10/26/2014 Elsevier Interactive Patient Education 2016 [employer]  

## 2016-02-01 NOTE — Assessment & Plan Note (Signed)
MRI shows extensive intrasubstance tearing of the extensor carpi ulnaris tendon. She will continue with her Exos cast for 2 weeks, if no improvement by 3 weeks we will place her in a standard fiberglass cast.

## 2016-02-02 ENCOUNTER — Ambulatory Visit: Payer: BLUE CROSS/BLUE SHIELD | Admitting: Sports Medicine

## 2016-02-03 ENCOUNTER — Ambulatory Visit (INDEPENDENT_AMBULATORY_CARE_PROVIDER_SITE_OTHER): Payer: BLUE CROSS/BLUE SHIELD | Admitting: Sports Medicine

## 2016-02-03 ENCOUNTER — Encounter: Payer: Self-pay | Admitting: Sports Medicine

## 2016-02-03 VITALS — BP 134/84 | HR 65 | Wt 150.5 lb

## 2016-02-03 DIAGNOSIS — B354 Tinea corporis: Secondary | ICD-10-CM

## 2016-02-03 DIAGNOSIS — L84 Corns and callosities: Secondary | ICD-10-CM

## 2016-02-03 MED ORDER — VALACYCLOVIR HCL 500 MG PO TABS: 500.0000 mg | ORAL_TABLET | ORAL | Status: DC | PRN

## 2016-02-03 MED ORDER — CLOTRIMAZOLE-BETAMETHASONE 1-0.05 % EX CREA: 1.0000 "application " | TOPICAL_CREAM | Freq: Two times a day (BID) | CUTANEOUS | Status: DC

## 2016-02-03 NOTE — Assessment & Plan Note (Signed)
Adding Lotrisone.

## 2016-02-03 NOTE — Patient Instructions (Signed)
Body Ringworm °Ringworm (tinea corporis) is a fungal infection of the skin on the body. This infection is not caused by worms, but is actually caused by a fungus. Fungus normally lives on the top of your skin and can be useful. However, in the case of ringworms, the fungus grows out of control and causes a skin infection. It can involve any area of skin on the body and can spread easily from one person to another (contagious). Ringworm is a common problem for children, but it can affect adults as well. Ringworm is also often found in athletes, especially wrestlers who share equipment and mats.  °CAUSES  °Ringworm of the body is caused by a fungus called dermatophyte. It can spread by: °· Touching other people who are infected. °· Touching infected pets. °· Touching or sharing objects that have been in contact with the infected person or pet (hats, combs, towels, clothing, sports equipment). °SYMPTOMS  °· Itchy, raised red spots and bumps on the skin. °· Ring-shaped rash. °· Redness near the border of the rash with a clear center. °· Dry and scaly skin on or around the rash. °Not every person develops a ring-shaped rash. Some develop only the red, scaly patches. °DIAGNOSIS  °Most often, ringworm can be diagnosed by performing a skin exam. Your caregiver may choose to take a skin scraping from the affected area. The sample will be examined under the microscope to see if the fungus is present.  °TREATMENT  °Body ringworm may be treated with a topical antifungal cream or ointment. Sometimes, an antifungal shampoo that can be used on your body is prescribed. You may be prescribed antifungal medicines to take by mouth if your ringworm is severe, keeps coming back, or lasts a long time.  °HOME CARE INSTRUCTIONS  °· Only take over-the-counter or prescription medicines as directed by your caregiver. °· Wash the infected area and dry it completely before applying your cream or ointment. °· When using antifungal shampoo to  treat the ringworm, leave the shampoo on the body for 3-5 minutes before rinsing.    °· Wear loose clothing to stop clothes from rubbing and irritating the rash. °· Wash or change your bed sheets every night while you have the rash. °· Have your pet treated by your veterinarian if it has the same infection. °To prevent ringworm:  °· Practice good hygiene. °· Wear sandals or shoes in public places and showers. °· Do not share personal items with others. °· Avoid touching red patches of skin on other people. °· Avoid touching pets that have bald spots or wash your hands after doing so. °SEEK MEDICAL CARE IF:  °· Your rash continues to spread after 7 days of treatment. °· Your rash is not gone in 4 weeks. °· The area around your rash becomes red, warm, tender, and swollen. °  °This information is not intended to replace advice given to you by your health care provider. Make sure you discuss any questions you have with your health care provider. °  °Document Released: 10/27/2000 Document Revised: 07/24/2012 Document Reviewed: 05/13/2012 °Elsevier Interactive Patient Education ©2016 Elsevier Inc. ° °

## 2016-02-03 NOTE — Assessment & Plan Note (Signed)
Completely resolved after coring out.

## 2016-02-03 NOTE — Progress Notes (Signed)
  Subjective:    CC:  Follow-up  HPI:  corn on foot: Resolved now after excision.   Skin rash: located on face around eyes, small risk. Circular flat lesions as well.  Past medical history, Surgical history, Family history not pertinant except as noted below, Social history, Allergies, and medications have been entered into the medical record, reviewed, and no changes needed.   Review of Systems: No fevers, chills, night sweats, weight loss, chest pain, or shortness of breath.   Objective:    General: Well Developed, well nourished, and in no acute distress.  Neuro: Alert and oriented x3, extra-ocular muscles intact, sensation grossly intact.  HEENT: Normocephalic, atraumatic, pupils equal round reactive to light, neck supple, no masses, no lymphadenopathy, thyroid nonpalpable.  Skin: Warm and dry,  Small circular flat plaques on the face with scaly leading edges suggestive of tinea corporis. Cardiac: Regular rate and rhythm, no murmurs rubs or gallops, no lower extremity edema.  Respiratory: Clear to auscultation bilaterally. Not using accessory muscles, speaking in full sentences.  Impression and Recommendations:    I spent 25 minutes with this patient, greater than 50% was face-to-face time counseling regarding the above diagnoses

## 2016-02-04 ENCOUNTER — Ambulatory Visit (INDEPENDENT_AMBULATORY_CARE_PROVIDER_SITE_OTHER): Payer: BLUE CROSS/BLUE SHIELD | Admitting: Rehabilitative and Restorative Service Providers"

## 2016-02-04 ENCOUNTER — Encounter: Payer: Self-pay | Admitting: Rehabilitative and Restorative Service Providers"

## 2016-02-04 ENCOUNTER — Other Ambulatory Visit: Payer: Self-pay | Admitting: Sports Medicine

## 2016-02-04 DIAGNOSIS — M25532 Pain in left wrist: Secondary | ICD-10-CM | POA: Diagnosis not present

## 2016-02-04 DIAGNOSIS — S63092D Other subluxation of left wrist and hand, subsequent encounter: Secondary | ICD-10-CM

## 2016-02-04 DIAGNOSIS — Z7409 Other reduced mobility: Secondary | ICD-10-CM | POA: Diagnosis not present

## 2016-02-04 DIAGNOSIS — R29898 Other symptoms and signs involving the musculoskeletal system: Secondary | ICD-10-CM | POA: Diagnosis not present

## 2016-02-04 NOTE — Patient Instructions (Signed)
AROM: Forearm Pronation / Supination    With right arm in handshake position, slowly rotate palm down until stretch is felt. Relax. Then rotate palm up until stretch is felt. Repeat _10__ times per set. Do __1__ sets per session. Do _1-2___ sessions per day.   AROM: Wrist Radial / Ulnar Deviation    Gently bend left wrist from side to side as far as possible. Repeat __10__ times per set. Do _1___ sets per session. Do _1-2___ sessions per day.    AROM: Wrist Flexion / Extension    Actively bend right wrist forward then back as far as possible. Repeat __10__ times per set. Do _1___ sets per session. Do __1-2__ sessions per day.    Finger Extension / Thumb Abduction: Resisted    With rubber band around right thumb and ___10_____ fingers, hand slightly cupped, gently spread thumb and fingers apart. Repeat _1___ times per set. Do ___1-2_  sessions per day.   Ice for pain or discomfort

## 2016-02-04 NOTE — Therapy (Addendum)
[facility] [place], [place], [address] Phone: [phone]   Fax:  [phone]  Physical Therapy Evaluation  Patient Details  Name: Heidi Mclaughlin MRN: [id number] Date of Birth: 12/05/1955 Referring Provider: Dr. Dianah Field   Encounter Date: 02/04/2016      PT End of Session - 02/04/16 1431    Visit Number 1   Number of Visits 4   Date for PT Re-Evaluation 03/03/16   PT Start Time 1341   PT Stop Time 9242   PT Time Calculation (min) 56 min   Activity Tolerance Patient tolerated treatment well      Past Medical History  Diagnosis Date  . Allergy-induced asthma   . Hyperlipidemia   . RLS (restless legs syndrome)   . Allergy   . Arthritis   . Hypertension     Past Surgical History  Procedure Laterality Date  . Cholecystectomy  1982  . Appendectomy  1982  . Abdominal hysterectomy  2003  . Incontinence surgery  2003  . Tubal ligation  1992  . Tonsillectomy and adenoidectomy  1962    There were no vitals filed for this visit.  Visit Diagnosis:  Left wrist pain - Plan: PT plan of care cert/re-cert  Weakness of wrist - Plan: PT plan of care cert/re-cert  Impaired functional mobility and endurance - Plan: PT plan of care cert/re-cert      Subjective Assessment - 02/04/16 1349    Subjective Levette reports that she noticed pain in the Lt wrist several months ago with symptoms gradually increased. She experienced sudden onset of severe pain 09/20/15 in the wrist. She was placed in hard splint and continues to wear the brace. She reports increase in sympotms in the wrist the first of February. She was seen by MD and is to now wear splint continually.    Pertinent History LBP    Diagnostic tests MRI shows severe tenosynovitis Lt  ECU    Patient Stated Goals get brace off hand and be able to use hand and arm normally    Currently in Pain? No/denies            [facility] PT Assessment - 02/04/16 0001     Assessment   Medical Diagnosis Lt extensor carpi ulnaris tendinitis    Referring Provider Dr. Dianah Field    Onset Date/Surgical Date 12/15/15   Hand Dominance Right   Next MD Visit 02/25/16   Prior Therapy none for wrist    Precautions   Precaution Comments in brace all the time    Balance Screen   Has the patient fallen in the past 6 months No   Has the patient had a decrease in activity level because of a fear of falling?  No   Is the patient reluctant to leave their home because of a fear of falling?  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    Leisure gym 3-4 days/wk yoga 2 days/wk works with trainer 2 days/wk - household chores; cooking    Observation/Other Assessments   Focus on Therapeutic Outcomes (FOTO)  44% limitation    Sensation   Additional Comments WFL's per pt report    Posture/Postural Control   Posture Comments head forward; shoulders rounded    AROM  Right/Left Shoulder --  WNL's   Right/Left Elbow --  WNL's   Right/Left Forearm --  WNL's   Right Wrist Extension 73 Degrees   Right Wrist Flexion 76 Degrees   Right Wrist Radial Deviation 18 Degrees   Right Wrist Ulnar Deviation 44 Degrees   Left Wrist Extension 73 Degrees   Left Wrist Flexion 75 Degrees   Left Wrist Radial Deviation 12 Degrees   Left Wrist Ulnar Deviation 48 Degrees   Strength   Overall Strength Comments 5/5 bilat UE's  -  discomfort with resistive Lt wrist extension; Lt wrist ulnar deviation 4/5    Palpation   Palpation comment tenderness through Lt ulnar wrist at [place] [facility] PT Treatment/Exercise - 02/04/16 0001    Wrist Exercises   Forearm Supination AROM;Left;10 reps   Forearm Pronation AROM;Left;10 reps    Wrist Flexion AROM;Left;10 reps   Wrist Extension AROM;Left;10 reps   Wrist Radial Deviation AROM;Left;10 reps   Wrist Ulnar Deviation AROM;Left;10 reps   Other wrist exercises eccentric ulnar deviation with yellow TB with PT assist x 10    Other wrist exercises finger abduction against TB resistance x 10    Cryotherapy   Number Minutes Cryotherapy 12 Minutes   Cryotherapy Location Wrist   Type of Cryotherapy Ice pack                PT Education - 02/04/16 1423    Education provided Yes   Education Details HEP    Person(s) Educated Patient   Methods Explanation;Demonstration;Tactile cues;Verbal cues;Handout   Comprehension Verbalized understanding;Returned demonstration;Verbal cues required;Tactile cues required          PT Short Term Goals - 02/04/16 1437    PT SHORT TERM GOAL #1   Title Improve stability and strength Lt wrist 03/03/16   Time 4   Period Weeks   Status New   PT SHORT TERM GOAL #2   Title I in HEP 03/03/16   Time 4   Period Weeks   Status New   PT SHORT TERM GOAL #3   Title Improve FOTO to </= 32% limitation 03/03/16   Time 4   Period Weeks   Status New                  Plan - 02/04/16 1433    Clinical Impression Statement Luv presents wtih Lt extensor carpi ulnaris tendonitis of several months duration. She has had increase in symptoms in the past month. She is now in a hard brace throughout the day. She has tendonopathy per MRI. Patient is referred to PT for eccentric strengthening of Lt ECU.    Pt will benefit from skilled therapeutic intervention in order to improve on the following deficits Impaired UE functional use;Decreased strength;Decreased endurance;Decreased activity tolerance;Pain   Rehab Potential Fair   PT Frequency 1x / week   PT Duration 4 weeks   PT Treatment/Interventions Patient/family education;ADLs/Self Care Home Management;Neuromuscular re-education;Therapeutic exercise;Therapeutic activities;Cryotherapy;Electrical  Stimulation;Iontophoresis '4mg'$ /ml Dexamethasone;Moist Heat;Ultrasound;Manual techniques   PT Next Visit Plan assess response to treatment and  HEP   PT Home Exercise Plan HEP    Consulted and Agree with Plan of Care Patient         Problem List Patient Active Problem List   Diagnosis Date Noted  . Tinea corporis 02/03/2016  . Corn of foot 02/01/2016  . Hemorrhoids, external 12/07/2015  .  Tendinitis of left extensor carpi ulnaris tendon 08/17/2015  . Trigger finger, acquired 11/17/2014  . Leg swelling 08/07/2014  . Trochanteric bursitis of left hip 01/13/2014  . Benign essential hypertension 12/29/2013  . Spondylolisthesis of lumbar region 08/07/2013  . Multiple allergies 04/24/2013  . Hyperlipidemia 04/24/2013  . Restless legs 04/24/2013  . Knee osteoarthritis 04/24/2013  . Annual physical exam 04/24/2013    Celyn Nilda Simmer PT, MPH  02/04/2016, 4:17 PM  [facility] [place], [place], [address] Phone: [phone]   Fax:  [phone]  Name: Quenesha Douglass MRN: [id number] Date of Birth: 12/14/1955

## 2016-02-09 ENCOUNTER — Other Ambulatory Visit: Payer: Self-pay | Admitting: Sports Medicine

## 2016-02-09 ENCOUNTER — Ambulatory Visit (INDEPENDENT_AMBULATORY_CARE_PROVIDER_SITE_OTHER): Payer: BLUE CROSS/BLUE SHIELD | Admitting: Physical Therapy

## 2016-02-09 DIAGNOSIS — R29898 Other symptoms and signs involving the musculoskeletal system: Secondary | ICD-10-CM

## 2016-02-09 DIAGNOSIS — R52 Pain, unspecified: Secondary | ICD-10-CM | POA: Diagnosis not present

## 2016-02-09 DIAGNOSIS — M25532 Pain in left wrist: Secondary | ICD-10-CM | POA: Diagnosis not present

## 2016-02-09 DIAGNOSIS — Z7409 Other reduced mobility: Secondary | ICD-10-CM | POA: Diagnosis not present

## 2016-02-09 NOTE — Therapy (Signed)
[place], [place], [address] Phone: [phone]   Fax:  [phone]  Physical Therapy Treatment  Patient Details  Name: Heidi Mclaughlin MRN: [id number] Date of Birth: 1956-01-08 Referring Provider: Dr. Helane Rima   Encounter Date: 02/09/2016      PT End of Session - 02/09/16 1020    Visit Number 2   Number of Visits 4   Date for PT Re-Evaluation 03/03/16   PT Start Time 1017   PT Stop Time 1055   PT Time Calculation (min) 38 min   Activity Tolerance Patient tolerated treatment well;No increased pain   Behavior During Therapy [facility] for tasks assessed/performed      Past Medical History  Diagnosis Date  . Allergy-induced asthma   . Hyperlipidemia   . RLS (restless legs syndrome)   . Allergy   . Arthritis   . Hypertension     Past Surgical History  Procedure Laterality Date  . Cholecystectomy  1982  . Appendectomy  1982  . Abdominal hysterectomy  2003  . Incontinence surgery  2003  . Tubal ligation  1992  . Tonsillectomy and adenoidectomy  1962    There were no vitals filed for this visit.  Visit Diagnosis:  Left wrist pain  Weakness of wrist  Impaired functional mobility and endurance  Pain of multiple sites      Subjective Assessment - 02/09/16 1020    Subjective Pt reports no new changes since last visit.  Continues to wear brace at all times except shower.    Diagnostic tests MRI shows severe tenosynovitis Lt  ECU    Patient Stated Goals get brace off hand and be able to use hand and arm normally    Currently in Pain? No/denies  just tender to touch at lateral Lt wrist            [facility] PT Assessment - 02/09/16 0001    Assessment   Medical Diagnosis Lt extensor carpi ulnaris tendinitis    Referring Provider Dr. Helane Rima    Onset Date/Surgical Date 12/15/15   Hand Dominance Right   Next MD Visit 02/25/16   AROM   Left Wrist Radial Deviation 24 Degrees   Left Wrist Ulnar Deviation 50 Degrees   Palpation   Palpation comment tenderness through Lt ulnar wrist at ECU           [facility] Adult PT Treatment/Exercise - 02/09/16 0001    Elbow Exercises   Forearm Supination AROM;Left;10 reps   Forearm Pronation AROM;Left;10 reps   Wrist Flexion AROM;Left;10 reps   Wrist Extension AROM;Left;10 reps   Wrist Exercises   Wrist Radial Deviation AROM;Left;10 reps   Wrist Ulnar Deviation AROM;Left;10 reps   Other wrist exercises Lt wrist flexion ext/flex stretch x 30 sec x 3 sets    Modalities   Modalities Iontophoresis;Ultrasound   Ultrasound   Ultrasound Location Lt lateral wrist    Ultrasound Parameters 50%, 1.1 w/cm2, 3.3 mHz, 8 min    Ultrasound Goals Pain;Edema   Iontophoresis   Type of Iontophoresis Dexamethasone   Location Left lateral wrist    Dose 1.0 cc    Time 6 hr patch    Manual Therapy   Manual Therapy Soft tissue mobilization   Manual therapy comments Pin and stretch to Lt wrist flex/ext and active ROM with wrist in all directions to tolerance    Soft tissue mobilization Edge tool assistance to Lt wrist ext, flexors and Lt lateral wrist   Prosthetics  Prosthetic Care Comments                      PT Short Term Goals - 02/04/16 1437    PT SHORT TERM GOAL #1   Title Improve stability and strength Lt wrist 03/03/16   Time 4   Period Weeks   Status New   PT SHORT TERM GOAL #2   Title I in HEP 03/03/16   Time 4   Period Weeks   Status New   PT SHORT TERM GOAL #3   Title Improve FOTO to </= 32% limitation 03/03/16   Time 4   Period Weeks   Status New                  Plan - 02/09/16 1102    Clinical Impression Statement Pt tolerated treatment well, with minimal increase in pain with ulnar deviation.  Increased Lt wrist ROM since last visit.  Trial of ionto started on Lt wrist.     Pt will benefit from skilled therapeutic intervention in order to improve on the following deficits Impaired UE functional  use;Decreased strength;Decreased endurance;Decreased activity tolerance;Pain   Rehab Potential Fair   PT Frequency 1x / week   PT Duration 4 weeks   PT Treatment/Interventions Patient/family education;ADLs/Self Care Home Management;Neuromuscular re-education;Therapeutic exercise;Therapeutic activities;Cryotherapy;Electrical Stimulation;Iontophoresis '4mg'$ /ml Dexamethasone;Moist Heat;Ultrasound;Manual techniques   PT Next Visit Plan Assess response to ionto; progress HEP as tolerated.    Consulted and Agree with Plan of Care Patient        Problem List Patient Active Problem List   Diagnosis Date Noted  . Tinea corporis 02/03/2016  . Corn of foot 02/01/2016  . Hemorrhoids, external 12/07/2015  . Tendinitis of left extensor carpi ulnaris tendon 08/17/2015  . Trigger finger, acquired 11/17/2014  . Leg swelling 08/07/2014  . Trochanteric bursitis of left hip 01/13/2014  . Benign essential hypertension 12/29/2013  . Spondylolisthesis of lumbar region 08/07/2013  . Multiple allergies 04/24/2013  . Hyperlipidemia 04/24/2013  . Restless legs 04/24/2013  . Knee osteoarthritis 04/24/2013  . Annual physical exam 04/24/2013   Heidi Mclaughlin, PTA 02/09/2016 11:16 AM [place], [place], [address] Phone: [phone]   Fax:  [phone]  Name: Heidi Mclaughlin MRN: [id number] Date of Birth: 08-Aug-1956

## 2016-02-16 ENCOUNTER — Encounter: Payer: BLUE CROSS/BLUE SHIELD | Admitting: Physical Therapy

## 2016-02-16 ENCOUNTER — Ambulatory Visit: Payer: BLUE CROSS/BLUE SHIELD | Admitting: Physical Therapy

## 2016-02-16 DIAGNOSIS — R29898 Other symptoms and signs involving the musculoskeletal system: Secondary | ICD-10-CM

## 2016-02-16 DIAGNOSIS — Z7409 Other reduced mobility: Secondary | ICD-10-CM

## 2016-02-16 DIAGNOSIS — M25532 Pain in left wrist: Secondary | ICD-10-CM

## 2016-02-16 NOTE — Therapy (Signed)
[place], [place], [address] Phone: [phone]   Fax:  [phone]  Physical Therapy Treatment  Patient Details  Name: Heidi Mclaughlin MRN: [id number] Date of Birth: 05/12/1956 Referring Provider: Dr. Helane Rima   Encounter Date: 02/16/2016      PT End of Session - 02/16/16 1034    Visit Number 3   Number of Visits 4   Date for PT Re-Evaluation 03/03/16   PT Start Time 4388   PT Stop Time 1022   PT Time Calculation (min) 7 min      Past Medical History  Diagnosis Date  . Allergy-induced asthma   . Hyperlipidemia   . RLS (restless legs syndrome)   . Allergy   . Arthritis   . Hypertension     Past Surgical History  Procedure Laterality Date  . Cholecystectomy  1982  . Appendectomy  1982  . Abdominal hysterectomy  2003  . Incontinence surgery  2003  . Tubal ligation  1992  . Tonsillectomy and adenoidectomy  1962    There were no vitals filed for this visit.  Visit Diagnosis:  Left wrist pain  Weakness of wrist  Impaired functional mobility and endurance      Subjective Assessment - 02/16/16 1037    Subjective Pt reports she feels there has been no change since last visit.  She didn't notice any difference with use of ionto.  She has been performing HEP and continuing to keep Lt wrist in brace except for HEP and shower.  Pt voices concern and frustration with lack of progress.     Diagnostic tests MRI shows severe tenosynovitis Lt  ECU    Patient Stated Goals get brace off hand and be able to use hand and arm normally    Currently in Pain? No/denies  tender to touch - lateral Lt wrist          PT Short Term Goals - 02/16/16 1036    PT SHORT TERM GOAL #1   Title Improve stability and strength Lt wrist 03/03/16   Time 4   Period Weeks   Status Not Met   PT SHORT TERM GOAL #2   Title I in HEP 03/03/16   Time 4   Period Weeks   Status Achieved   PT SHORT TERM GOAL #3    Title Improve FOTO to </= 32% limitation 03/03/16   Time 4   Period Weeks   Status Unable to assess  FOTO not completed          Plan - 02/16/16 1038    Clinical Impression Statement Pt spoke with supervising PT, Celyn Holt.  Therapy held per pt request, as there has been no change.  Pt has partially met her goals, but requests to d/c and return to MD at this time.    Pt will benefit from skilled therapeutic intervention in order to improve on the following deficits Impaired UE functional use;Decreased strength;Decreased endurance;Decreased activity tolerance;Pain   Rehab Potential Fair   PT Frequency 1x / week   PT Duration 4 weeks   PT Treatment/Interventions Patient/family education;ADLs/Self Care Home Management;Neuromuscular re-education;Therapeutic exercise;Therapeutic activities;Cryotherapy;Electrical Stimulation;Iontophoresis 53m/ml Dexamethasone;Moist Heat;Ultrasound;Manual techniques   PT Next Visit Plan Pt to return to MD for further testing/ advisement.  will d/c at this time.    Consulted and Agree with Plan of Care Patient        Problem List Patient Active Problem List   Diagnosis Date Noted  .  Tinea corporis 02/03/2016  . Corn of foot 02/01/2016  . Hemorrhoids, external 12/07/2015  . Tendinitis of left extensor carpi ulnaris tendon 08/17/2015  . Trigger finger, acquired 11/17/2014  . Leg swelling 08/07/2014  . Trochanteric bursitis of left hip 01/13/2014  . Benign essential hypertension 12/29/2013  . Spondylolisthesis of lumbar region 08/07/2013  . Multiple allergies 04/24/2013  . Hyperlipidemia 04/24/2013  . Restless legs 04/24/2013  . Knee osteoarthritis 04/24/2013  . Annual physical exam 04/24/2013   Kerin Perna, PTA 02/16/2016 10:41 AM  [place], [place], [address] Phone: [phone]   Fax:  [phone]  Name: Olene Godfrey MRN: [id number] Date of  Birth: 1956-01-08

## 2016-02-18 ENCOUNTER — Ambulatory Visit (INDEPENDENT_AMBULATORY_CARE_PROVIDER_SITE_OTHER): Payer: BLUE CROSS/BLUE SHIELD | Admitting: Sports Medicine

## 2016-02-18 ENCOUNTER — Encounter: Payer: Self-pay | Admitting: Sports Medicine

## 2016-02-18 DIAGNOSIS — S63095D Other dislocation of left wrist and hand, subsequent encounter: Secondary | ICD-10-CM

## 2016-02-18 DIAGNOSIS — S63092D Other subluxation of left wrist and hand, subsequent encounter: Secondary | ICD-10-CM

## 2016-02-18 NOTE — Progress Notes (Signed)
  Subjective:    CC: Follow-up  HPI: Extensor carpi ulnaris tendinitis: Persistent despite months of physical therapy, Exos immobilization, injections, MRI shows extensive intrasubstance tearing of the ECU.  Past medical history, Surgical history, Family history not pertinant except as noted below, Social history, Allergies, and medications have been entered into the medical record, reviewed, and no changes needed.   Review of Systems: No fevers, chills, night sweats, weight loss, chest pain, or shortness of breath.   Objective:    General: Well Developed, well nourished, and in no acute distress.  Neuro: Alert and oriented x3, extra-ocular muscles intact, sensation grossly intact.  HEENT: Normocephalic, atraumatic, pupils equal round reactive to light, neck supple, no masses, no lymphadenopathy, thyroid nonpalpable.  Skin: Warm and dry, no rashes. Cardiac: Regular rate and rhythm, no murmurs rubs or gallops, no lower extremity edema.  Respiratory: Clear to auscultation bilaterally. Not using accessory muscles, speaking in full sentences.  Impression and Recommendations:

## 2016-02-18 NOTE — Assessment & Plan Note (Signed)
Persistent pain, MRI did show extensive tearing of the ECU. At this point she has failed months of immobilization, formal physical therapy, and ultrasound guided injections. Patient prefers Dr. Fredna Dow, referral placed.

## 2016-02-22 ENCOUNTER — Ambulatory Visit: Payer: BLUE CROSS/BLUE SHIELD | Admitting: Sports Medicine

## 2016-02-23 ENCOUNTER — Encounter: Payer: BLUE CROSS/BLUE SHIELD | Admitting: Rehabilitative and Restorative Service Providers"

## 2016-02-24 ENCOUNTER — Ambulatory Visit: Payer: BLUE CROSS/BLUE SHIELD | Admitting: Sports Medicine

## 2016-02-25 ENCOUNTER — Other Ambulatory Visit: Payer: Self-pay | Admitting: Sports Medicine

## 2016-03-15 DIAGNOSIS — L820 Inflamed seborrheic keratosis: Secondary | ICD-10-CM | POA: Diagnosis not present

## 2016-03-29 DIAGNOSIS — M25832 Other specified joint disorders, left wrist: Secondary | ICD-10-CM | POA: Diagnosis not present

## 2016-03-29 DIAGNOSIS — R52 Pain, unspecified: Secondary | ICD-10-CM | POA: Diagnosis not present

## 2016-03-29 DIAGNOSIS — M779 Enthesopathy, unspecified: Secondary | ICD-10-CM | POA: Diagnosis not present

## 2016-05-01 ENCOUNTER — Ambulatory Visit (INDEPENDENT_AMBULATORY_CARE_PROVIDER_SITE_OTHER): Payer: BLUE CROSS/BLUE SHIELD | Admitting: Sports Medicine

## 2016-05-01 ENCOUNTER — Encounter: Payer: Self-pay | Admitting: Sports Medicine

## 2016-05-01 VITALS — BP 121/82 | HR 68 | Temp 97.9°F | Wt 152.0 lb

## 2016-05-01 DIAGNOSIS — S63095D Other dislocation of left wrist and hand, subsequent encounter: Secondary | ICD-10-CM | POA: Diagnosis not present

## 2016-05-01 DIAGNOSIS — E785 Hyperlipidemia, unspecified: Secondary | ICD-10-CM

## 2016-05-01 DIAGNOSIS — I10 Essential (primary) hypertension: Secondary | ICD-10-CM | POA: Diagnosis not present

## 2016-05-01 DIAGNOSIS — J0100 Acute maxillary sinusitis, unspecified: Secondary | ICD-10-CM | POA: Diagnosis not present

## 2016-05-01 DIAGNOSIS — S63092D Other subluxation of left wrist and hand, subsequent encounter: Secondary | ICD-10-CM

## 2016-05-01 LAB — COMPREHENSIVE METABOLIC PANEL WITH GFR
BUN: 11 mg/dL (ref 7–25)
CO2: 28 mmol/L (ref 20–31)
Chloride: 102 mmol/L (ref 98–110)
Creat: 0.84 mg/dL (ref 0.50–0.99)
Glucose, Bld: 91 mg/dL (ref 65–99)
Sodium: 137 mmol/L (ref 135–146)
Total Protein: 6.5 g/dL (ref 6.1–8.1)

## 2016-05-01 LAB — HEMOGLOBIN A1C
Hgb A1c MFr Bld: 5.2 % (ref <5.7)
Mean Plasma Glucose: 103 mg/dL

## 2016-05-01 LAB — COMPREHENSIVE METABOLIC PANEL
ALT: 22 U/L (ref 6–29)
AST: 22 U/L (ref 10–35)
Albumin: 4.4 g/dL (ref 3.6–5.1)
Alkaline Phosphatase: 44 U/L (ref 33–130)
Calcium: 9.6 mg/dL (ref 8.6–10.4)
Potassium: 4.4 mmol/L (ref 3.5–5.3)
Total Bilirubin: 0.6 mg/dL (ref 0.2–1.2)

## 2016-05-01 LAB — LIPID PANEL
Cholesterol: 178 mg/dL (ref 125–200)
HDL: 111 mg/dL (ref >=46)
LDL Cholesterol: 53 mg/dL (ref <130)
Total CHOL/HDL Ratio: 1.6 ratio (ref <=5.0)
Triglycerides: 69 mg/dL (ref <150)
VLDL: 14 mg/dL (ref <30)

## 2016-05-01 LAB — CBC
HCT: 42.6 % (ref 35.0–45.0)
Hemoglobin: 14.7 g/dL (ref 11.7–15.5)
MCH: 29.7 pg (ref 27.0–33.0)
MCHC: 34.5 g/dL (ref 32.0–36.0)
MCV: 86.1 fL (ref 80.0–100.0)
MPV: 10.3 fL (ref 7.5–12.5)
Platelets: 261 K/uL (ref 140–400)
RBC: 4.95 MIL/uL (ref 3.80–5.10)
RDW: 13.6 % (ref 11.0–15.0)
WBC: 5.2 10*3/uL (ref 3.8–10.8)

## 2016-05-01 MED ORDER — AZITHROMYCIN 250 MG PO TABS: ORAL_TABLET | ORAL | Status: DC

## 2016-05-01 NOTE — Assessment & Plan Note (Signed)
Improved with topical anti-inflammatories, has touch base with hand surgery, further management through them.

## 2016-05-01 NOTE — Assessment & Plan Note (Signed)
Well controlled, no changes 

## 2016-05-01 NOTE — Progress Notes (Signed)
  Subjective:    CC: Follow-up  HPI: Wrist pain: Has touched based with hand surgery, overall doing well with topical NSAIDs.  Sinus infection: Present for a week and a half, pain and pressure over the right maxillary sinus, radiation to the ear, pain is moderate, persistent without any constitutional symptoms.  Preventive measures: Due for some blood work.  Past medical history, Surgical history, Family history not pertinant except as noted below, Social history, Allergies, and medications have been entered into the medical record, reviewed, and no changes needed.   Review of Systems: No fevers, chills, night sweats, weight loss, chest pain, or shortness of breath.   Objective:    General: Well Developed, well nourished, and in no acute distress.  Neuro: Alert and oriented x3, extra-ocular muscles intact, sensation grossly intact.  HEENT: Normocephalic, atraumatic, pupils equal round reactive to light, neck supple, no masses, no lymphadenopathy, thyroid nonpalpable.  Skin: Warm and dry, no rashes. Cardiac: Regular rate and rhythm, no murmurs rubs or gallops, no lower extremity edema.  Respiratory: Clear to auscultation bilaterally. Not using accessory muscles, speaking in full sentences.  Impression and Recommendations:    I spent 25 minutes with this patient, greater than 50% was face-to-face time counseling regarding the above diagnoses

## 2016-05-01 NOTE — Assessment & Plan Note (Signed)
Azithromycin.

## 2016-05-24 DIAGNOSIS — R52 Pain, unspecified: Secondary | ICD-10-CM | POA: Diagnosis not present

## 2016-05-24 DIAGNOSIS — M779 Enthesopathy, unspecified: Secondary | ICD-10-CM | POA: Diagnosis not present

## 2016-05-24 DIAGNOSIS — M25832 Other specified joint disorders, left wrist: Secondary | ICD-10-CM | POA: Diagnosis not present

## 2016-07-10 ENCOUNTER — Ambulatory Visit (INDEPENDENT_AMBULATORY_CARE_PROVIDER_SITE_OTHER): Payer: BLUE CROSS/BLUE SHIELD | Admitting: Sports Medicine

## 2016-07-10 ENCOUNTER — Encounter: Payer: Self-pay | Admitting: Sports Medicine

## 2016-07-10 VITALS — BP 122/75 | HR 73 | Resp 18 | Wt 152.6 lb

## 2016-07-10 DIAGNOSIS — L237 Allergic contact dermatitis due to plants, except food: Secondary | ICD-10-CM | POA: Diagnosis not present

## 2016-07-10 MED ORDER — METHYLPREDNISOLONE ACETATE 40 MG/ML IJ SUSP
40.0000 mg | Freq: Once | INTRAMUSCULAR | Status: AC
  Administered 2016-07-10: 40 mg via INTRAMUSCULAR

## 2016-07-10 MED ORDER — METHYLPREDNISOLONE SODIUM SUCC 125 MG IJ SOLR
125.0000 mg | Freq: Once | INTRAMUSCULAR | Status: AC
  Administered 2016-07-10: 125 mg via INTRAMUSCULAR

## 2016-07-10 NOTE — Assessment & Plan Note (Signed)
Per patient request we are going to proceed with parenteral treatment rather than topical. SoluMedrol 125 mg, Depo-Medrol 40 mg intramuscular. Return as needed.

## 2016-07-10 NOTE — Progress Notes (Signed)
  Subjective:    CC: poison ivy rash   HPI: 60 yo F presenting with three days of forearm itching and pustular rash. Patient says she was gardening last week in an area of her garden known to have poison ivy.  The next day she was itchy all over and started to notice a rash developing on her R forearm that has now spread to her R leg and neck.  She says this rash is exactly like poison ivy that she has had in the past after working in her garden.  She has tried a "poison ivy scrub" from [employer] that has helped with the itching.  She is hoping for a steroid shot today and says last time she was given topical steroids which took over 4 weeks to help relieve her itching and rash.    Past medical history, Surgical history, Family history not pertinant except as noted below, Social history, Allergies, and medications have been entered into the medical record, reviewed, and no changes needed.   Review of Systems: No fevers, chills, night sweats, weight loss, chest pain, or shortness of breath.   Objective:    General: Well Developed, well nourished, and in no acute distress.  Neuro: Alert and oriented x3, extra-ocular muscles intact, sensation grossly intact.  HEENT: Normocephalic, atraumatic, pupils equal round reactive to light, neck supple, no masses, no lymphadenopathy, thyroid nonpalpable.  Skin: Warm and dry.  Well-demarcated, pink-red papules on L forearm, L thigh, underside of chin.  No surrounding erythema.  Cardiac: Regular rate and rhythm, no murmurs rubs or gallops, no lower extremity edema.  Respiratory: Clear to auscultation bilaterally. Not using accessory muscles, speaking in full sentences.   Impression and Recommendations:    1. Poison ivy -Solumedrol and depomedrol injection today

## 2016-08-02 ENCOUNTER — Other Ambulatory Visit: Payer: Self-pay | Admitting: Sports Medicine

## 2016-08-17 ENCOUNTER — Encounter: Payer: Self-pay | Admitting: Sports Medicine

## 2016-08-17 ENCOUNTER — Ambulatory Visit (INDEPENDENT_AMBULATORY_CARE_PROVIDER_SITE_OTHER): Payer: BLUE CROSS/BLUE SHIELD | Admitting: Sports Medicine

## 2016-08-17 DIAGNOSIS — Z01818 Encounter for other preprocedural examination: Secondary | ICD-10-CM

## 2016-08-17 DIAGNOSIS — R079 Chest pain, unspecified: Secondary | ICD-10-CM | POA: Insufficient documentation

## 2016-08-17 LAB — COMPREHENSIVE METABOLIC PANEL WITH GFR
AST: 18 U/L (ref 10–35)
Albumin: 4.6 g/dL (ref 3.6–5.1)
BUN: 15 mg/dL (ref 7–25)
CO2: 26 mmol/L (ref 20–31)
Creat: 0.88 mg/dL (ref 0.50–0.99)
Sodium: 137 mmol/L (ref 135–146)
Total Bilirubin: 0.5 mg/dL (ref 0.2–1.2)
Total Protein: 6.6 g/dL (ref 6.1–8.1)

## 2016-08-17 LAB — COMPREHENSIVE METABOLIC PANEL
ALT: 18 U/L (ref 6–29)
Alkaline Phosphatase: 46 U/L (ref 33–130)
Calcium: 10.2 mg/dL (ref 8.6–10.4)
Chloride: 100 mmol/L (ref 98–110)
Glucose, Bld: 83 mg/dL (ref 65–99)
Potassium: 4.1 mmol/L (ref 3.5–5.3)

## 2016-08-17 LAB — CBC
HCT: 42.7 % (ref 35.0–45.0)
Hemoglobin: 15 g/dL (ref 11.7–15.5)
MCH: 30.1 pg (ref 27.0–33.0)
MCHC: 35.1 g/dL (ref 32.0–36.0)
MCV: 85.7 fL (ref 80.0–100.0)
MPV: 10.5 fL (ref 7.5–12.5)
Platelets: 259 10*3/uL (ref 140–400)
RBC: 4.98 MIL/uL (ref 3.80–5.10)
RDW: 13 % (ref 11.0–15.0)
WBC: 5 10*3/uL (ref 3.8–10.8)

## 2016-08-17 NOTE — Assessment & Plan Note (Signed)
Greater than 4 METS  exercise tolerance. I think she is a good risk for intermediate risk noncardiac surgery/brachioplasty. CBC, CMP, PTT/PT per patient request. Return as needed.

## 2016-08-17 NOTE — Progress Notes (Signed)
  Subjective:    CC: Surgical clearance  HPI: This is a pleasant 60 year old female, she has scheduled her brachioplasty. Need operative clearance, she is able to walk up 2-3 flights of steps without any issues, exercises regularly, and has no history of increased bleeding or blood clotting.  Past medical history:  Negative.  See flowsheet/record as well for more information.  Surgical history: Negative.  See flowsheet/record as well for more information.  Family history: Negative.  See flowsheet/record as well for more information.  Social history: Negative.  See flowsheet/record as well for more information.  Allergies, and medications have been entered into the medical record, reviewed, and no changes needed.   Review of Systems: No fevers, chills, night sweats, weight loss, chest pain, or shortness of breath.   Objective:    General: Well Developed, well nourished, and in no acute distress.  Neuro: Alert and oriented x3, extra-ocular muscles intact, sensation grossly intact.  HEENT: Normocephalic, atraumatic, pupils equal round reactive to light, neck supple, no masses, no lymphadenopathy, thyroid nonpalpable.  Skin: Warm and dry, no rashes. Cardiac: Regular rate and rhythm, no murmurs rubs or gallops, no lower extremity edema.  Respiratory: Clear to auscultation bilaterally. Not using accessory muscles, speaking in full sentences.  Twelve-lead EKG shows normal rate, rhythm, no ST changes, no PR changes, normal axis.  Impression and Recommendations:    Preoperative clearance Greater than 4 METS  exercise tolerance. I think she is a good risk for intermediate risk noncardiac surgery/brachioplasty. CBC, CMP, PTT/PT per patient request. Return as needed.

## 2016-08-18 LAB — PROTIME-INR
INR: 1
Prothrombin Time: 10.4 s (ref 9.0–11.5)

## 2016-08-18 LAB — APTT: aPTT: 25 s (ref 22–34)

## 2016-08-29 ENCOUNTER — Other Ambulatory Visit: Payer: Self-pay | Admitting: *Deleted

## 2016-08-29 MED ORDER — FLUTICASONE PROPIONATE 50 MCG/ACT NA SUSP: 1.0000 | Freq: Two times a day (BID) | NASAL | 0 refills | Status: DC

## 2016-08-29 NOTE — Progress Notes (Signed)
Request for 90 day supply of fluticasone

## 2016-08-31 ENCOUNTER — Ambulatory Visit (INDEPENDENT_AMBULATORY_CARE_PROVIDER_SITE_OTHER): Payer: BLUE CROSS/BLUE SHIELD | Admitting: Sports Medicine

## 2016-08-31 DIAGNOSIS — Z23 Encounter for immunization: Secondary | ICD-10-CM

## 2016-08-31 NOTE — Addendum Note (Signed)
Addended by: Terance Hart on: 08/31/2016 02:01 PM   Modules accepted: Orders

## 2016-08-31 NOTE — Progress Notes (Signed)
Flu shot given

## 2016-09-06 DIAGNOSIS — Z803 Family history of malignant neoplasm of breast: Secondary | ICD-10-CM | POA: Diagnosis not present

## 2016-09-06 DIAGNOSIS — Z1231 Encounter for screening mammogram for malignant neoplasm of breast: Secondary | ICD-10-CM | POA: Diagnosis not present

## 2016-09-06 LAB — HM MAMMOGRAPHY

## 2016-09-07 ENCOUNTER — Encounter: Payer: Self-pay | Admitting: Sports Medicine

## 2016-10-12 ENCOUNTER — Other Ambulatory Visit: Payer: Self-pay | Admitting: Sports Medicine

## 2016-12-05 ENCOUNTER — Other Ambulatory Visit: Payer: Self-pay | Admitting: Sports Medicine

## 2017-01-25 ENCOUNTER — Other Ambulatory Visit: Payer: Self-pay | Admitting: Sports Medicine

## 2017-01-26 ENCOUNTER — Telehealth: Payer: Self-pay | Admitting: Sports Medicine

## 2017-02-05 MED ORDER — ROPINIROLE HCL 1 MG PO TABS: 1.0000 mg | ORAL_TABLET | Freq: Three times a day (TID) | ORAL | 3 refills | Status: DC

## 2017-02-05 NOTE — Telephone Encounter (Signed)
MRS. Heidi Mclaughlin NEEDS HER ROPINIROLE HCL '1MG'$  REFILLED BUT SHE NEEDS TO MAKE SURE ITS GOES TO HER NEW PHARMACY [facility] [address], WHERE ALL HER MEDS NEED TO GO.

## 2017-02-07 ENCOUNTER — Other Ambulatory Visit: Payer: Self-pay

## 2017-02-07 DIAGNOSIS — K644 Residual hemorrhoidal skin tags: Secondary | ICD-10-CM

## 2017-02-07 MED ORDER — ROPINIROLE HCL 1 MG PO TABS: 1.0000 mg | ORAL_TABLET | Freq: Three times a day (TID) | ORAL | 3 refills | Status: DC

## 2017-02-07 MED ORDER — VALACYCLOVIR HCL 500 MG PO TABS: 500.0000 mg | ORAL_TABLET | ORAL | 0 refills | Status: DC | PRN

## 2017-02-07 MED ORDER — FLUTICASONE PROPIONATE 50 MCG/ACT NA SUSP: 1.0000 | Freq: Two times a day (BID) | NASAL | 0 refills | Status: DC

## 2017-02-07 MED ORDER — SIMVASTATIN 40 MG PO TABS: 40.0000 mg | ORAL_TABLET | Freq: Every day | ORAL | 3 refills | Status: DC

## 2017-02-07 MED ORDER — MONTELUKAST SODIUM 10 MG PO TABS: 10.0000 mg | ORAL_TABLET | Freq: Every day | ORAL | 1 refills | Status: DC

## 2017-02-07 MED ORDER — HYDROCHLOROTHIAZIDE 25 MG PO TABS: 25.0000 mg | ORAL_TABLET | Freq: Every day | ORAL | 3 refills | Status: DC

## 2017-02-07 MED ORDER — MAGNESIUM OXIDE 400 (241.3 MG) MG PO TABS: 2.0000 | ORAL_TABLET | Freq: Every day | ORAL | 2 refills | Status: DC

## 2017-02-07 MED ORDER — FOLIC ACID 1 MG PO TABS: ORAL_TABLET | ORAL | 3 refills | Status: DC

## 2017-02-16 ENCOUNTER — Encounter: Payer: Self-pay | Admitting: Sports Medicine

## 2017-02-16 ENCOUNTER — Ambulatory Visit (INDEPENDENT_AMBULATORY_CARE_PROVIDER_SITE_OTHER): Payer: BLUE CROSS/BLUE SHIELD | Admitting: Sports Medicine

## 2017-02-16 DIAGNOSIS — Z889 Allergy status to unspecified drugs, medicaments and biological substances status: Secondary | ICD-10-CM

## 2017-02-16 MED ORDER — LEVOCETIRIZINE DIHYDROCHLORIDE 5 MG PO TABS: 5.0000 mg | ORAL_TABLET | Freq: Every evening | ORAL | 11 refills | Status: DC

## 2017-02-16 NOTE — Assessment & Plan Note (Signed)
Having a bit of cough that is likely more of an allergic bronchitis. Continue Singulair, Flonase, switching Zyrtec to Xyzal. Return as needed.

## 2017-02-16 NOTE — Progress Notes (Signed)
  Subjective:    CC: Mild cough   HPI: For the past 2 days this pleasant 61 year old female has had a mild cough, it is the start of allergy season, also surrounding a, has been using Flonase, Zyrtec, Singulair without much improvement. No shortness of breath, no fevers, chills, night sweats, weight loss.  Past medical history:  Negative.  See flowsheet/record as well for more information.  Surgical history: Negative.  See flowsheet/record as well for more information.  Family history: Negative.  See flowsheet/record as well for more information.  Social history: Negative.  See flowsheet/record as well for more information.  Allergies, and medications have been entered into the medical record, reviewed, and no changes needed.   Review of Systems: No fevers, chills, night sweats, weight loss, chest pain, or shortness of breath.   Objective:    General: Well Developed, well nourished, and in no acute distress.  Neuro: Alert and oriented x3, extra-ocular muscles intact, sensation grossly intact.  HEENT: Normocephalic, atraumatic, pupils equal round reactive to light, neck supple, no masses, no lymphadenopathy, thyroid nonpalpable. Oropharynx, nasopharynx, ear canals unremarkable.  Skin: Warm and dry, no rashes. Cardiac: Regular rate and rhythm, no murmurs rubs or gallops, no lower extremity edema.  Respiratory: Clear to auscultation bilaterally. Not using accessory muscles, speaking in full sentences.  Impression and Recommendations:    Multiple allergies Having a bit of cough that is likely more of an allergic bronchitis. Continue Singulair, Flonase, switching Zyrtec to Xyzal. Return as needed.  I spent 25 minutes with this patient, greater than 50% was face-to-face time counseling regarding the above diagnoses

## 2017-02-19 ENCOUNTER — Ambulatory Visit (INDEPENDENT_AMBULATORY_CARE_PROVIDER_SITE_OTHER): Payer: BLUE CROSS/BLUE SHIELD | Admitting: Sports Medicine

## 2017-02-19 ENCOUNTER — Ambulatory Visit (INDEPENDENT_AMBULATORY_CARE_PROVIDER_SITE_OTHER): Payer: BLUE CROSS/BLUE SHIELD

## 2017-02-19 ENCOUNTER — Encounter: Payer: Self-pay | Admitting: Sports Medicine

## 2017-02-19 DIAGNOSIS — Z889 Allergy status to unspecified drugs, medicaments and biological substances status: Secondary | ICD-10-CM

## 2017-02-19 DIAGNOSIS — R05 Cough: Secondary | ICD-10-CM

## 2017-02-19 DIAGNOSIS — R0989 Other specified symptoms and signs involving the circulatory and respiratory systems: Secondary | ICD-10-CM | POA: Diagnosis not present

## 2017-02-19 MED ORDER — AZITHROMYCIN 250 MG PO TABS: ORAL_TABLET | ORAL | 0 refills | Status: DC

## 2017-02-19 MED ORDER — PREDNISONE 50 MG PO TABS: 50.0000 mg | ORAL_TABLET | Freq: Every day | ORAL | 0 refills | Status: DC

## 2017-02-19 NOTE — Assessment & Plan Note (Signed)
Persistent cough, malaise in spite of aggressive treatment for allergies with Xyzal, Singulair, Flonase. At this point I am going to treat her for a bronchitis and image her. Chest x-ray, azithromycin, prednisone. Return to see me if no better in one week.

## 2017-02-19 NOTE — Progress Notes (Signed)
  Subjective:    CC: Coughing  HPI: I saw this pleasant 61 year old female last week, we diagnosed her with seasonal allergies, and switched around her allergy regimen, overall her nose has dried up but she still has a persistent cough, with fatigue, no bloody sputum. Symptoms are moderate, persistent.  Past medical history:  Negative.  See flowsheet/record as well for more information.  Surgical history: Negative.  See flowsheet/record as well for more information.  Family history: Negative.  See flowsheet/record as well for more information.  Social history: Negative.  See flowsheet/record as well for more information.  Allergies, and medications have been entered into the medical record, reviewed, and no changes needed.   Review of Systems: No fevers, chills, night sweats, weight loss, chest pain, or shortness of breath.   Objective:    General: Well Developed, well nourished, and in no acute distress.  Neuro: Alert and oriented x3, extra-ocular muscles intact, sensation grossly intact.  HEENT: Normocephalic, atraumatic, pupils equal round reactive to light, neck supple, no masses, no lymphadenopathy, thyroid nonpalpable. Oropharynx, nasopharynx, ear canals unremarkable Skin: Warm and dry, no rashes. Cardiac: Regular rate and rhythm, no murmurs rubs or gallops, no lower extremity edema.  Respiratory: Crackles in the left upper lobe. Not using accessory muscles, speaking in full sentences.  Impression and Recommendations:    Multiple allergies Persistent cough, malaise in spite of aggressive treatment for allergies with Xyzal, Singulair, Flonase. At this point I am going to treat her for a bronchitis and image her. Chest x-ray, azithromycin, prednisone. Return to see me if no better in one week.  I spent 25 minutes with this patient, greater than 50% was face-to-face time counseling regarding the above diagnoses

## 2017-02-20 ENCOUNTER — Encounter: Payer: Self-pay | Admitting: Sports Medicine

## 2017-02-24 ENCOUNTER — Other Ambulatory Visit: Payer: Self-pay | Admitting: Sports Medicine

## 2017-02-27 ENCOUNTER — Other Ambulatory Visit: Payer: Self-pay | Admitting: Sports Medicine

## 2017-03-01 ENCOUNTER — Encounter: Payer: Self-pay | Admitting: Sports Medicine

## 2017-03-01 ENCOUNTER — Ambulatory Visit (INDEPENDENT_AMBULATORY_CARE_PROVIDER_SITE_OTHER): Payer: BLUE CROSS/BLUE SHIELD | Admitting: Sports Medicine

## 2017-03-01 DIAGNOSIS — J301 Allergic rhinitis due to pollen: Secondary | ICD-10-CM | POA: Diagnosis not present

## 2017-03-01 MED ORDER — CLARITHROMYCIN 500 MG PO TABS: 500.0000 mg | ORAL_TABLET | Freq: Two times a day (BID) | ORAL | 0 refills | Status: DC

## 2017-03-01 NOTE — Assessment & Plan Note (Signed)
Recent allergic bronchitis, now with maxillary sinusitis. Did not tolerate Xyzal, currently on Zyrtec, Flonase, Singulair. Has had several courses of steroids. Adding Biaxin and referral to allergist for assistance, and consideration of Ragwitek if appropriate.

## 2017-03-01 NOTE — Progress Notes (Signed)
  Subjective:    CC: Follow-up  HPI: This is a pleasant 61 year old female, we have treated her for allergic bronchitis, allergic sinusitis in the past. She's continued to have worsening symptoms in spite of second-generation antihistamines, Singulair, Flonase. Now she's having a new onset pain and pressure behind the maxillary sinuses, consistent with acute sinusitis with purulent nasal discharge. Moderate, persistent, only minimal cough.  Past medical history:  Negative.  See flowsheet/record as well for more information.  Surgical history: Negative.  See flowsheet/record as well for more information.  Family history: Negative.  See flowsheet/record as well for more information.  Social history: Negative.  See flowsheet/record as well for more information.  Allergies, and medications have been entered into the medical record, reviewed, and no changes needed.   Review of Systems: No fevers, chills, night sweats, weight loss, chest pain, or shortness of breath.   Objective:    General: Well Developed, well nourished, and in no acute distress.  Neuro: Alert and oriented x3, extra-ocular muscles intact, sensation grossly intact.  HEENT: Normocephalic, atraumatic, pupils equal round reactive to light, neck supple, no masses, no lymphadenopathy, thyroid nonpalpable. Oropharynx, ear canals unremarkable, turbinates are boggy and erythematous and there is mild tenderness over the maxillary sinuses. Skin: Warm and dry, no rashes. Cardiac: Regular rate and rhythm, no murmurs rubs or gallops, no lower extremity edema.  Respiratory: Clear to auscultation bilaterally. Not using accessory muscles, speaking in full sentences.  Impression and Recommendations:    Seasonal allergies Recent allergic bronchitis, now with maxillary sinusitis. Did not tolerate Xyzal, currently on Zyrtec, Flonase, Singulair. Has had several courses of steroids. Adding Biaxin and referral to allergist for assistance, and  consideration of Ragwitek if appropriate.  I spent 25 minutes with this patient, greater than 50% was face-to-face time counseling regarding the above diagnoses

## 2017-03-26 ENCOUNTER — Encounter: Payer: Self-pay | Admitting: Sports Medicine

## 2017-03-30 ENCOUNTER — Ambulatory Visit: Payer: Self-pay | Admitting: Allergy & Immunology

## 2017-07-19 ENCOUNTER — Other Ambulatory Visit: Payer: Self-pay | Admitting: Sports Medicine

## 2017-07-27 DIAGNOSIS — K219 Gastro-esophageal reflux disease without esophagitis: Secondary | ICD-10-CM | POA: Diagnosis not present

## 2017-07-27 DIAGNOSIS — E785 Hyperlipidemia, unspecified: Secondary | ICD-10-CM | POA: Diagnosis not present

## 2017-07-27 DIAGNOSIS — I10 Essential (primary) hypertension: Secondary | ICD-10-CM | POA: Diagnosis not present

## 2017-07-27 DIAGNOSIS — R002 Palpitations: Secondary | ICD-10-CM | POA: Diagnosis not present

## 2017-08-07 DIAGNOSIS — I493 Ventricular premature depolarization: Secondary | ICD-10-CM | POA: Diagnosis not present

## 2017-08-07 DIAGNOSIS — R072 Precordial pain: Secondary | ICD-10-CM | POA: Diagnosis not present

## 2017-08-07 DIAGNOSIS — I10 Essential (primary) hypertension: Secondary | ICD-10-CM | POA: Diagnosis not present

## 2017-08-30 ENCOUNTER — Ambulatory Visit (INDEPENDENT_AMBULATORY_CARE_PROVIDER_SITE_OTHER): Payer: BLUE CROSS/BLUE SHIELD

## 2017-08-30 ENCOUNTER — Ambulatory Visit (INDEPENDENT_AMBULATORY_CARE_PROVIDER_SITE_OTHER): Payer: BLUE CROSS/BLUE SHIELD | Admitting: Sports Medicine

## 2017-08-30 DIAGNOSIS — R0789 Other chest pain: Secondary | ICD-10-CM | POA: Diagnosis not present

## 2017-08-30 DIAGNOSIS — M25571 Pain in right ankle and joints of right foot: Secondary | ICD-10-CM | POA: Diagnosis not present

## 2017-08-30 DIAGNOSIS — Z23 Encounter for immunization: Secondary | ICD-10-CM | POA: Diagnosis not present

## 2017-08-30 DIAGNOSIS — M79671 Pain in right foot: Secondary | ICD-10-CM

## 2017-08-30 MED ORDER — DICLOFENAC SODIUM 75 MG PO TBEC: 75.0000 mg | DELAYED_RELEASE_TABLET | Freq: Two times a day (BID) | ORAL | 3 refills | Status: DC

## 2017-08-30 NOTE — Assessment & Plan Note (Signed)
Had an episode of chest pain, enzymes were cycled and negative, did see Dr. Beverlyn Roux with cardiology who recommended a stress echo as she did have some symptomatic PVCs and ventricular couplets on imaging, her ankle is hurting so I think it would be okay for her to delay her stress echo, she walks up multiple flights of stairs often without chest pain, I do think her chest pain is noncardiac.

## 2017-08-30 NOTE — Progress Notes (Signed)
  Subjective:    CC: Right ankle pain  HPI: This is a pleasant 61 year old female, for the past couple of weeks she has had a vague pain that she localizes on the lateral aspect of her ankle, she does get gelling after being still for a while, morning stiffness, no history of trauma.  Minor swelling.  Pain is worse with prolonged weightbearing as well, no mechanical symptoms.  Chest pain: Had a single episode when emotionally stressed out, she was seen in the emergency department, and then by cardiology.  Had a few ECG abnormalities described as a few symptomatic PVCs and ventricular couplets.  She was scheduled for a stress echo but has not yet done this.  On further questioning she is able to walk up several flights of stairs without any form of chest pain, palpitations.  Her main concern was if she would be able to postpone a stress echo until her ankle feels better.  Past medical history:  Negative.  See flowsheet/record as well for more information.  Surgical history: Negative.  See flowsheet/record as well for more information.  Family history: Negative.  See flowsheet/record as well for more information.  Social history: Negative.  See flowsheet/record as well for more information.  Allergies, and medications have been entered into the medical record, reviewed, and no changes needed.   Review of Systems: No fevers, chills, night sweats, weight loss, chest pain, or shortness of breath.   Objective:    General: Well Developed, well nourished, and in no acute distress.  Neuro: Alert and oriented x3, extra-ocular muscles intact, sensation grossly intact.  HEENT: Normocephalic, atraumatic, pupils equal round reactive to light, neck supple, no masses, no lymphadenopathy, thyroid nonpalpable.  Skin: Warm and dry, no rashes. Cardiac: Regular rate and rhythm, no murmurs rubs or gallops, no lower extremity edema.  Respiratory: Clear to auscultation bilaterally. Not using accessory muscles,  speaking in full sentences. Right ankle: Minimal swelling Range of motion is full in all directions. Strength is 5/5 in all directions. Stable lateral and medial ligaments; squeeze test and kleiger test unremarkable; Mild tenderness over the lateral talar dome No pain at base of 5th MT; No tenderness over cuboid; No tenderness over N spot or navicular prominence No tenderness on posterior aspects of lateral and medial malleolus No sign of peroneal tendon subluxations; Negative tarsal tunnel tinel's Able to walk 4 steps.  Impression and Recommendations:    Right ankle pain Present only for a few weeks now. ASO, rehab exercises, Voltaren, and x-rays. Return to see me in 2 or 3 weeks, injection if no better, I do suspect the tibiotalar osteoarthritis with gelling and morning stiffness.  Chest pain, non-cardiac Had an episode of chest pain, enzymes were cycled and negative, did see Dr. Beverlyn Roux with cardiology who recommended a stress echo as she did have some symptomatic PVCs and ventricular couplets on imaging, her ankle is hurting so I think it would be okay for her to delay her stress echo, she walks up multiple flights of stairs often without chest pain, I do think her chest pain is noncardiac.  ___________________________________________ Gwen Her. Dianah Field, M.D., ABFM., CAQSM. Primary Care and [place] Instructor of [place] of [facility] of Medicine

## 2017-08-30 NOTE — Assessment & Plan Note (Signed)
Present only for a few weeks now. ASO, rehab exercises, Voltaren, and x-rays. Return to see me in 2 or 3 weeks, injection if no better, I do suspect the tibiotalar osteoarthritis with gelling and morning stiffness.

## 2017-09-03 ENCOUNTER — Encounter: Payer: Self-pay | Admitting: Sports Medicine

## 2017-09-11 DIAGNOSIS — Z1231 Encounter for screening mammogram for malignant neoplasm of breast: Secondary | ICD-10-CM | POA: Diagnosis not present

## 2017-09-11 DIAGNOSIS — Z803 Family history of malignant neoplasm of breast: Secondary | ICD-10-CM | POA: Diagnosis not present

## 2017-09-11 LAB — HM MAMMOGRAPHY

## 2017-09-24 ENCOUNTER — Ambulatory Visit: Payer: BLUE CROSS/BLUE SHIELD | Admitting: Sports Medicine

## 2017-09-24 ENCOUNTER — Encounter: Payer: Self-pay | Admitting: Sports Medicine

## 2017-09-24 DIAGNOSIS — M25571 Pain in right ankle and joints of right foot: Secondary | ICD-10-CM

## 2017-09-24 DIAGNOSIS — H1011 Acute atopic conjunctivitis, right eye: Secondary | ICD-10-CM | POA: Diagnosis not present

## 2017-09-24 DIAGNOSIS — H04201 Unspecified epiphora, right lacrimal gland: Secondary | ICD-10-CM | POA: Insufficient documentation

## 2017-09-24 MED ORDER — AZELASTINE HCL 0.05 % OP SOLN
1.0000 [drp] | Freq: Two times a day (BID) | OPHTHALMIC | 11 refills | Status: DC
Start: 2017-09-24 — End: 2018-10-21

## 2017-09-24 NOTE — Assessment & Plan Note (Signed)
Resolved with conservative measures, ASO brace, rehab exercises, return as needed for this.

## 2017-09-24 NOTE — Progress Notes (Signed)
  Subjective:    CC: Follow-up  HPI: This is a pleasant 61 year old female, I treated her for right ankle pain at the last visit, this resolved with an ASO, rehab exercises.  X-rays were negative.  She is also had itching of the right eye with clear discharge, no left eye changes, no visual changes, no headaches, no trauma.  Past medical history:  Negative.  See flowsheet/record as well for more information.  Surgical history: Negative.  See flowsheet/record as well for more information.  Family history: Negative.  See flowsheet/record as well for more information.  Social history: Negative.  See flowsheet/record as well for more information.  Allergies, and medications have been entered into the medical record, reviewed, and no changes needed.   Review of Systems: No fevers, chills, night sweats, weight loss, chest pain, or shortness of breath.   Objective:    General: Well Developed, well nourished, and in no acute distress.  Neuro: Alert and oriented x3, extra-ocular muscles intact, sensation grossly intact.  HEENT: Normocephalic, atraumatic, pupils equal round reactive to light, neck supple, no masses, no lymphadenopathy, thyroid nonpalpable.  Eyes and conjunctivae are unremarkable. Skin: Warm and dry, no rashes. Cardiac: Regular rate and rhythm, no murmurs rubs or gallops, no lower extremity edema.  Respiratory: Clear to auscultation bilaterally. Not using accessory muscles, speaking in full sentences. Right ankle: No visible erythema or swelling. Range of motion is full in all directions. Strength is 5/5 in all directions. Stable lateral and medial ligaments; squeeze test and kleiger test unremarkable; Talar dome nontender; No pain at base of 5th MT; No tenderness over cuboid; No tenderness over N spot or navicular prominence No tenderness on posterior aspects of lateral and medial malleolus No sign of peroneal tendon subluxations; Negative tarsal tunnel tinel's Able to walk  4 steps.  Impression and Recommendations:    Right ankle pain Resolved with conservative measures, ASO brace, rehab exercises, return as needed for this.  Allergic conjunctivitis of right eye She will do massage of the nasolacrimal duct and adding Optivar eyedrops.  I spent 25 minutes with this patient, greater than 50% was face-to-face time counseling regarding the above diagnoses ___________________________________________ Gwen Her. Dianah Field, M.D., ABFM., CAQSM. Primary Care and [place] Instructor of [place] of [facility] of Medicine

## 2017-09-24 NOTE — Assessment & Plan Note (Signed)
She will do massage of the nasolacrimal duct and adding Optivar eyedrops.

## 2017-10-03 DIAGNOSIS — R072 Precordial pain: Secondary | ICD-10-CM | POA: Diagnosis not present

## 2017-10-03 DIAGNOSIS — I493 Ventricular premature depolarization: Secondary | ICD-10-CM | POA: Diagnosis not present

## 2017-10-03 DIAGNOSIS — I10 Essential (primary) hypertension: Secondary | ICD-10-CM | POA: Diagnosis not present

## 2017-10-23 ENCOUNTER — Ambulatory Visit: Payer: BLUE CROSS/BLUE SHIELD | Admitting: Sports Medicine

## 2017-10-26 ENCOUNTER — Encounter: Payer: Self-pay | Admitting: Sports Medicine

## 2017-10-26 ENCOUNTER — Ambulatory Visit: Payer: BLUE CROSS/BLUE SHIELD | Admitting: Sports Medicine

## 2017-10-26 DIAGNOSIS — H04201 Unspecified epiphora, right lacrimal gland: Secondary | ICD-10-CM | POA: Diagnosis not present

## 2017-10-26 DIAGNOSIS — M25511 Pain in right shoulder: Secondary | ICD-10-CM

## 2017-10-26 MED ORDER — ROPINIROLE HCL 1 MG PO TABS: 1.0000 mg | ORAL_TABLET | Freq: Every day | ORAL | 3 refills | Status: DC

## 2017-10-26 NOTE — Progress Notes (Signed)
Subjective:    CC: Right shoulder pain, recheck eyes  HPI: Eye watering: No symptoms on the left side now, right eye continues to water, she did have blepharoplasty in the past. No pain, no visual changes, no trauma. We have done Patanol for the last week without any improvement on the right eye. She's also been doing aggressive nasolacrimal duct massage.  She's also had right shoulder pain for the past week, anterior, worse with forward flexion, moderate, persistent. No radiation. No trauma.  Past medical history:  Negative.  See flowsheet/record as well for more information.  Surgical history: Negative.  See flowsheet/record as well for more information.  Family history: Negative.  See flowsheet/record as well for more information.  Social history: Negative.  See flowsheet/record as well for more information.  Allergies, and medications have been entered into the medical record, reviewed, and no changes needed.   (To billers/coders, pertinent past medical, social, surgical, family history can be found in problem list, if problem list is marked as reviewed then this indicates that past medical, social, surgical, family history was also reviewed)  Review of Systems: No fevers, chills, night sweats, weight loss, chest pain, or shortness of breath.   Objective:    General: Well Developed, well nourished, and in no acute distress.  Neuro: Alert and oriented x3, extra-ocular muscles intact, sensation grossly intact.  HEENT: Normocephalic, atraumatic, pupils equal round reactive to light, neck supple, no masses, no lymphadenopathy, thyroid nonpalpable. Right eye is unremarkable to inspection, no anterior chamber ecchymosis, no hyphema, no conjunctival injection. No proptosis. No surrounding skin rash. Skin: Warm and dry, no rashes. Cardiac: Regular rate and rhythm, no murmurs rubs or gallops, no lower extremity edema.  Respiratory: Clear to auscultation bilaterally. Not using accessory  muscles, speaking in full sentences. Right Shoulder: Inspection reveals no abnormalities, atrophy or asymmetry. Palpation is normal with no tenderness over AC joint or bicipital groove. ROM is full in all planes. Rotator cuff strength normal throughout. No signs of impingement with negative Neer and Hawkin's tests, empty can. Speeds and Yergason's tests both positive. No labral pathology noted with negative Obrien's, negative crank, negative clunk, and good stability. Normal scapular function observed. No painful arc and no drop arm sign. No apprehension sign  Procedure: Real-time Ultrasound Guided Injection of right biceps tendon sheath Device: GE Logiq E  Verbal informed consent obtained.  Time-out conducted.  Noted no overlying erythema, induration, or other signs of local infection.  Skin prepped in a sterile fashion.  Local anesthesia: Topical Ethyl chloride.  With sterile technique and under real time ultrasound guidance:  Using a 25-gauge needle and taking care to avoid intratendinous injection advanced into the bicipital sheath, there was copious synovitis around the biceps tendon itself which was well located. There was some intrasubstance tearing of the biceps tendon. I then injected 1 mL kenalog 40, 1 mL lidocaine, 1 mL bupivacaine. Completed without difficulty  Pain immediately resolved suggesting accurate placement of the medication.  Advised to call if fevers/chills, erythema, induration, drainage, or persistent bleeding.  Images permanently stored and available for review in the ultrasound unit.  Impression: Technically successful ultrasound guided injection.  Impression and Recommendations:    Watering of right eye History of blepharoplasty. We tried Patanol for a month without evidence. Only having watering of the right eye, no visual changes, no injection or pain to suggest infectious conjunctivitis, foreign body, corneal abrasion. She has been doing nasal lacrimal  duct massage without much improvement, at this point I  think when it specialty evaluation.  Acute pain of right shoulder Positive speeds and Yergason test consistent with biceps tendinitis. Injection placed into the bicipital groove, rehabilitation exercises given, return in one month.  ___________________________________________ Gwen Her. Dianah Field, M.D., ABFM., CAQSM. Primary Care and [place] Instructor of [place] of [facility] of Medicine

## 2017-10-26 NOTE — Assessment & Plan Note (Signed)
Positive speeds and Yergason test consistent with biceps tendinitis. Injection placed into the bicipital groove, rehabilitation exercises given, return in one month.

## 2017-10-26 NOTE — Assessment & Plan Note (Signed)
History of blepharoplasty. We tried Patanol for a month without evidence. Only having watering of the right eye, no visual changes, no injection or pain to suggest infectious conjunctivitis, foreign body, corneal abrasion. She has been doing nasal lacrimal duct massage without much improvement, at this point I think when it specialty evaluation.

## 2017-10-29 DIAGNOSIS — H04121 Dry eye syndrome of right lacrimal gland: Secondary | ICD-10-CM | POA: Diagnosis not present

## 2017-11-23 ENCOUNTER — Encounter: Payer: Self-pay | Admitting: Sports Medicine

## 2017-11-26 ENCOUNTER — Ambulatory Visit: Payer: BLUE CROSS/BLUE SHIELD | Admitting: Sports Medicine

## 2017-11-26 ENCOUNTER — Encounter: Payer: Self-pay | Admitting: Sports Medicine

## 2017-11-26 DIAGNOSIS — M25511 Pain in right shoulder: Secondary | ICD-10-CM | POA: Diagnosis not present

## 2017-11-26 MED ORDER — CELECOXIB 200 MG PO CAPS: ORAL_CAPSULE | ORAL | 2 refills | Status: DC

## 2017-11-26 NOTE — Progress Notes (Signed)
Subjective:    CC: Follow-up  HPI: Right shoulder pain: I saw this pleasant 62 year old female last month, we did a bicipital sheath injection, she did well until recently.  Now having a recurrence of pain, anterior, worse with forward flexion of the shoulder, consistent with biceps tendon pain.  Has really not been doing any rehab exercises, pain is moderate, persistent, localized without radiation.  I reviewed the past medical history, family history, social history, surgical history, and allergies today and no changes were needed.  Please see the problem list section below in epic for further details.  Past Medical History: Past Medical History:  Diagnosis Date  . Allergy   . Allergy-induced asthma   . Arthritis   . Hyperlipidemia   . Hypertension   . RLS (restless legs syndrome)    Past Surgical History: Past Surgical History:  Procedure Laterality Date  . ABDOMINAL HYSTERECTOMY  2003  . APPENDECTOMY  1982  . CHOLECYSTECTOMY  1982  . INCONTINENCE SURGERY  2003  . [place]  . TUBAL LIGATION  1992   Social History: Social History   Socioeconomic History  . Marital status: Married    Spouse name: None  . Number of children: None  . Years of education: None  . Highest education level: None  Social Needs  . Financial resource strain: None  . Food insecurity - worry: None  . Food insecurity - inability: None  . Transportation needs - medical: None  . Transportation needs - non-medical: None  Occupational History  . None  Tobacco Use  . Smoking status: Former Smoker    Last attempt to quit: 11/13/1972    Years since quitting: 45.0  . Smokeless tobacco: Never Used  Substance and Sexual Activity  . Alcohol use: Yes    Alcohol/week: 0.6 oz    Types: 1 Glasses of wine per week  . Drug use: No  . Sexual activity: Not Currently  Other Topics Concern  . None  Social History Narrative  . None   Family History: Family History  Problem  Relation Age of Onset  . Cancer Mother        lung  . Hypertension Father   . Heart attack Father   . Colon cancer Neg Hx   . Esophageal cancer Neg Hx   . Rectal cancer Neg Hx   . Stomach cancer Neg Hx   . Prostate cancer Maternal Uncle    Allergies: Allergies  Allergen Reactions  . Amoxicillin Anaphylaxis  . Penicillins Anaphylaxis  . Poison Ivy Extract [Poison Ivy Extract] Rash   Medications: See med rec.  Review of Systems: No fevers, chills, night sweats, weight loss, chest pain, or shortness of breath.   Objective:    General: Well Developed, well nourished, and in no acute distress.  Neuro: Alert and oriented x3, extra-ocular muscles intact, sensation grossly intact.  HEENT: Normocephalic, atraumatic, pupils equal round reactive to light, neck supple, no masses, no lymphadenopathy, thyroid nonpalpable.  Skin: Warm and dry, no rashes. Cardiac: Regular rate and rhythm, no murmurs rubs or gallops, no lower extremity edema.  Respiratory: Clear to auscultation bilaterally. Not using accessory muscles, speaking in full sentences. Right shoulder: Inspection reveals no abnormalities, atrophy or asymmetry. Palpation is normal with no tenderness over AC joint or bicipital groove. ROM is full in all planes. Rotator cuff strength normal throughout. No signs of impingement with negative Neer and Hawkin's tests, empty can. Speeds and Yergason's tests mildly positive. No labral pathology  noted with negative Obrien's, negative crank, negative clunk, and good stability. Normal scapular function observed. No painful arc and no drop arm sign. No apprehension sign  Impression and Recommendations:    Acute pain of right shoulder Bicipital tendon symptoms, good response to initial ultrasound guided injection but now having a mild recurrence of pain. She really does not have much rotator cuff symptoms, all biceps, I am going to put her through formal physical therapy before proceeding  with advanced imaging and any further interventions.  I spent 25 minutes with this patient, greater than 50% was face-to-face time counseling regarding the above diagnoses ___________________________________________ Gwen Her. Dianah Field, M.D., ABFM., CAQSM. Primary Care and [place] Instructor of [place] of [facility] of Medicine

## 2017-11-26 NOTE — Assessment & Plan Note (Signed)
Bicipital tendon symptoms, good response to initial ultrasound guided injection but now having a mild recurrence of pain. She really does not have much rotator cuff symptoms, all biceps, I am going to put her through formal physical therapy before proceeding with advanced imaging and any further interventions.

## 2017-12-02 ENCOUNTER — Other Ambulatory Visit: Payer: Self-pay | Admitting: Sports Medicine

## 2017-12-03 ENCOUNTER — Ambulatory Visit: Payer: BLUE CROSS/BLUE SHIELD | Admitting: Rehabilitative and Restorative Service Providers"

## 2017-12-03 ENCOUNTER — Encounter: Payer: Self-pay | Admitting: Rehabilitative and Restorative Service Providers"

## 2017-12-03 DIAGNOSIS — R293 Abnormal posture: Secondary | ICD-10-CM | POA: Diagnosis not present

## 2017-12-03 DIAGNOSIS — R29898 Other symptoms and signs involving the musculoskeletal system: Secondary | ICD-10-CM

## 2017-12-03 DIAGNOSIS — M25511 Pain in right shoulder: Secondary | ICD-10-CM

## 2017-12-03 DIAGNOSIS — Z7409 Other reduced mobility: Secondary | ICD-10-CM

## 2017-12-03 NOTE — Therapy (Signed)
[place], [place], [address] Phone: [phone]   Fax:  [phone]  Physical Therapy Evaluation  Patient Details  Name: Heidi Mclaughlin MRN: [id number] Date of Birth: [birth year] Referring Provider: Dr Dianah Field   Encounter Date: 12/03/2017  PT End of Session - 12/03/17 1552    Visit Number  1    Number of Visits  12    Date for PT Re-Evaluation  01/14/18    PT Start Time  1512    PT Stop Time  1608    PT Time Calculation (min)  56 min    Activity Tolerance  Patient tolerated treatment well       Past Medical History:  Diagnosis Date  . Allergy   . Allergy-induced asthma   . Arthritis   . Hyperlipidemia   . Hypertension   . RLS (restless legs syndrome)     Past Surgical History:  Procedure Laterality Date  . ABDOMINAL HYSTERECTOMY  2003  . APPENDECTOMY  1982  . CHOLECYSTECTOMY  1982  . INCONTINENCE SURGERY  2003  . [place]  . TUBAL LIGATION  1992    There were no vitals filed for this visit.   Subjective Assessment - 12/03/17 1518    Subjective  Patient reports injury to Rt shoulder ~ 6 weeks ago. She has pain with reaching across body, twisting arm, lifting something heavy. She received injection ~ 2-3 weeks ago with good improvement. She has some inflammation in shoudler per MD.     Pertinent History  LBP; thumb CMC arthritis     Diagnostic tests  xrays     Patient Stated Goals  get rid of pain and return to upper body workouts in the gym with trainer     Currently in Pain?  Yes    Pain Score  0-No pain    Pain Location  Shoulder    Pain Orientation  Right    Pain Descriptors / Indicators  Aching;Dull;Nagging    Pain Type  Acute pain    Pain Onset  More than a month ago    Pain Frequency  Intermittent    Aggravating Factors   twisting arm; reaching arm across body; sleeping on Rt side; lifting     Pain Relieving Factors  meds; avoiding wts;  injection          [facility] PT Assessment - 12/03/17 0001      Assessment   Medical Diagnosis  Rt shoulder pain    Referring Provider  Dr Dianah Field    Onset Date/Surgical Date  10/13/17    Hand Dominance  Right    Next MD Visit  2/19    Prior Therapy  none for shoulder       Precautions   Precautions  None      Balance Screen   Has the patient fallen in the past 6 months  No    Has the patient had a decrease in activity level because of a fear of falling?   No    Is the patient reluctant to leave their home because of a fear of falling?   No      Prior Function   Vocation  Full time employment    Therapist, nutritional     Leisure  gym 2-3 days/wk; yoga 1 x/wk household chores; cooking       Observation/Other Assessments   Focus on Therapeutic Outcomes (FOTO)   33%  limitation       Sensation   Additional Comments  WFL's per pt report       Posture/Postural Control   Posture Comments  head forward; shoulders rounded and elevated;       AROM   Right Shoulder Extension  62 Degrees    Right Shoulder Flexion  151 Degrees 'discomfort'    Right Shoulder ABduction  171 Degrees    Right Shoulder Internal Rotation  43 Degrees 'discomfort'    Right Shoulder External Rotation  95 Degrees 'twinge'    Left Shoulder Extension  54 Degrees    Left Shoulder Flexion  154 Degrees    Left Shoulder ABduction  171 Degrees    Left Shoulder Internal Rotation  35 Degrees    Left Shoulder External Rotation  90 Degrees      Strength   Overall Strength Comments  5/5 bilat UE's pain Rt shoulder with resisted shd flexion; abduction; ER       Palpation   Palpation comment  tightness to palpation Rt anterior chest; pecs; anterior deltoid; biceps; upper traps; leveator              Objective measurements completed on examination: See above findings.      [facility] Adult PT Treatment/Exercise - 12/03/17 0001      Neuro Re-ed    Neuro Re-ed  Details   working on posture and alignment engaging posterior shoulder girdle       Shoulder Exercises: Standing   Other Standing Exercises  axial extension 10 sec x 5; scap squeeze 10 sec x 10; L's x 10; W's x 10 with swim noodle       Shoulder Exercises: Stretch   Other Shoulder Stretches  3 way doorway stretch 30 sec x 2 each position       Moist Heat Therapy   Number Minutes Moist Heat  20 Minutes    Moist Heat Location  Shoulder Rt      Electrical Stimulation   Electrical Stimulation Location  Rt shoulder    Electrical Stimulation Action  IFC    Electrical Stimulation Parameters  to tolerance    Electrical Stimulation Goals  Pain;Tone             PT Education - 12/03/17 1539    Education provided  Yes    Education Details  postural correction; HEP; TENS     Person(s) Educated  Patient    Methods  Explanation;Demonstration;Tactile cues;Verbal cues;Handout    Comprehension  Verbalized understanding;Returned demonstration;Verbal cues required;Tactile cues required          PT Long Term Goals - 12/03/17 1556      PT LONG TERM GOAL #1   Title  Improve posture and alignment with patient to demonstrate good upright posture with posterior shoulder girdle engaged 01/14/18    Time  6    Period  Weeks    Status  New      PT LONG TERM GOAL #2   Title  Patient reports return to all functional activities involving Rt UE 01/14/18    Time  6    Period  Weeks    Status  New      PT LONG TERM GOAL #3   Title  Independent in HEP 01/14/18    Time  6    Period  Weeks    Status  New      PT LONG TERM GOAL #4   Title  return to the gym and her  personal trainer without difficulty or increased pain 3/4 19    Time  6    Period  Weeks    Status  New      PT LONG TERM GOAL #5   Title  improve FOTO =/< 29% limited 01/14/18    Time  6    Period  Weeks    Status  New             Plan - 12/03/17 1552    Clinical Impression Statement  Heidi Mclaughlin presents with 6 week history of Rt  shoulder pain which has responded well to injection 2-3 weeks ago but she has some persistent pain with functional activities and exercise at gym. Patient has poor posture and alignment; pain with resistive strength testing Rt shoulder; muscular tightness Rt upper quarter especially through the Rt pecs. Patient will benefit form PT to address problems identified.     Clinical Decision Making  Low    Rehab Potential  Good    PT Frequency  2x / week    PT Duration  6 weeks    PT Treatment/Interventions  Patient/family education;ADLs/Self Care Home Management;Cryotherapy;Electrical Stimulation;Iontophoresis 4mg /ml Dexamethasone;Moist Heat;Ultrasound;Dry needling;Manual techniques;Neuromuscular re-education;Therapeutic activities;Therapeutic exercise    PT Next Visit Plan  workon posture and alignment; review HEP; add prolonged pec stretch supine; manual work anterior Rt shoulder; posterior shoulder girdle strengthening; modlaities as indicated     Consulted and Agree with Plan of Care  Patient       Patient will benefit from skilled therapeutic intervention in order to improve the following deficits and impairments:  Postural dysfunction, Improper body mechanics, Pain, Increased muscle spasms, Increased fascial restricitons, Decreased activity tolerance  Visit Diagnosis: Acute pain of right shoulder - Plan: PT plan of care cert/re-cert  Other symptoms and signs involving the musculoskeletal system - Plan: PT plan of care cert/re-cert  Impaired functional mobility and endurance - Plan: PT plan of care cert/re-cert  Abnormal posture - Plan: PT plan of care cert/re-cert     Problem List Patient Active Problem List   Diagnosis Date Noted  . Acute pain of right shoulder 10/26/2017  . Watering of right eye 09/24/2017  . Right ankle pain 08/30/2017  . Chest pain, non-cardiac 08/17/2016  . Poison ivy dermatitis 07/10/2016  . Corn of foot 02/01/2016  . Hemorrhoids, external 12/07/2015  .  Tendinitis of left extensor carpi ulnaris tendon 08/17/2015  . Trigger finger, acquired 11/17/2014  . Trochanteric bursitis of left hip 01/13/2014  . Benign essential hypertension 12/29/2013  . Spondylolisthesis of lumbar region 08/07/2013  . Seasonal allergies 04/24/2013  . Hyperlipidemia 04/24/2013  . Restless legs 04/24/2013  . Knee osteoarthritis 04/24/2013  . Annual physical exam 04/24/2013    Monie Shere Nilda Simmer PT, MPH  12/03/2017, 4:02 PM  [facility] [place], [place], [address] Phone: [phone]   Fax:  [phone]  Name: Heidi Mclaughlin MRN: [id number] Date of Birth: 11-Dec-1955

## 2017-12-03 NOTE — Patient Instructions (Signed)
Axial Extension (Chin Tuck)    Pull chin in and lengthen back of neck. Hold __5__ seconds while counting out loud. Repeat __10__ times. Do __several__ sessions per day.  Shoulder Blade Squeeze   Can use swim noodle  Rotate shoulders back, then squeeze shoulder blades down and back. Hold 10 sec Repeat _10_ times. Do _several___ sessions per day.  Upper Back Strength: Lower Trapezius / Rotator Cuff " L's "     Arms in waitress pose, palms up. Press hands back and slide shoulder blades down. Hold for __5__ seconds. Repeat _10___ times. 1-2 times per day.    Scapular Retraction: Elbow Flexion (Standing)  "W's"     With elbows bent to 90, pinch shoulder blades together and rotate arms out, keeping elbows bent. Repeat __10__ times per set. Do __1-2__ sets per session. Do _several ___ sessions per day.  Scapula Adduction With Pectoralis Stretch: Low - Standing   Shoulders at 45 hands even with shoulders, keeping weight through legs, shift weight forward until you feel pull or stretch through the front of your chest. Hold _30__ seconds. Do _3__ times, _2-4__ times per day.   Scapula Adduction With Pectoralis Stretch: Mid-Range - Standing   Shoulders at 90 elbows even with shoulders, keeping weight through legs, shift weight forward until you feel pull or strength through the front of your chest. Hold __30_ seconds. Do _3__ times, __2-4_ times per day.   Scapula Adduction With Pectoralis Stretch: High - Standing   Shoulders at 120 hands up high on the doorway, keeping weight on feet, shift weight forward until you feel pull or stretch through the front of your chest. Hold _30__ seconds. Do _3__ times, _2-3__ times per day.   TENS UNIT: This is helpful for muscle pain and spasm.   Search and Purchase a TENS 7000 2nd edition at www.tenspros.com. It should be less than $30.     TENS unit instructions: Do not shower or bathe with the unit on Turn the unit off before  removing electrodes or batteries If the electrodes lose stickiness add a drop of water to the electrodes after they are disconnected from the unit and place on plastic sheet. If you continued to have difficulty, call the TENS unit company to purchase more electrodes. Do not apply lotion on the skin area prior to use. Make sure the skin is clean and dry as this will help prolong the life of the electrodes. After use, always check skin for unusual red areas, rash or other skin difficulties. If there are any skin problems, does not apply electrodes to the same area. Never remove the electrodes from the unit by pulling the wires. Do not use the TENS unit or electrodes other than as directed. Do not change electrode placement without consultating your therapist or physician. Keep 2 fingers with between each electrode.    [facility] [place], [place] [address]  [phone] (office) [phone] (fax)

## 2017-12-07 ENCOUNTER — Ambulatory Visit: Payer: BLUE CROSS/BLUE SHIELD | Admitting: Rehabilitative and Restorative Service Providers"

## 2017-12-07 ENCOUNTER — Encounter: Payer: Self-pay | Admitting: Rehabilitative and Restorative Service Providers"

## 2017-12-07 DIAGNOSIS — R293 Abnormal posture: Secondary | ICD-10-CM | POA: Diagnosis not present

## 2017-12-07 DIAGNOSIS — M25511 Pain in right shoulder: Secondary | ICD-10-CM | POA: Diagnosis not present

## 2017-12-07 DIAGNOSIS — Z7409 Other reduced mobility: Secondary | ICD-10-CM | POA: Diagnosis not present

## 2017-12-07 DIAGNOSIS — R29898 Other symptoms and signs involving the musculoskeletal system: Secondary | ICD-10-CM

## 2017-12-07 DIAGNOSIS — H04211 Epiphora due to excess lacrimation, right lacrimal gland: Secondary | ICD-10-CM | POA: Diagnosis not present

## 2017-12-07 NOTE — Therapy (Signed)
[place], [place], [address] Phone: [phone]   Fax:  [phone]  Physical Therapy Treatment  Patient Details  Name: Heidi Mclaughlin MRN: [id number] Date of Birth: Apr 01, 1956 Referring Provider: Dr Dianah Field   Encounter Date: 12/07/2017  Mclaughlin End of Session - 12/07/17 1331    Visit Number  2    Number of Visits  12    Date for Mclaughlin Re-Evaluation  01/14/18    Mclaughlin Start Time  1331    Mclaughlin Stop Time  1418    Mclaughlin Time Calculation (min)  47 min    Activity Tolerance  Patient tolerated treatment well       Past Medical History:  Diagnosis Date  . Allergy   . Allergy-induced asthma   . Arthritis   . Hyperlipidemia   . Hypertension   . RLS (restless legs syndrome)     Past Surgical History:  Procedure Laterality Date  . ABDOMINAL HYSTERECTOMY  2003  . APPENDECTOMY  1982  . CHOLECYSTECTOMY  1982  . INCONTINENCE SURGERY  2003  . [place]  . TUBAL LIGATION  1992    There were no vitals filed for this visit.  Subjective Assessment - 12/07/17 1332    Subjective  Shoulder is improving - "doing a lot better" She is working on her exercises and her arms are really sore from working with her trainer this week.     Currently in Pain?  No/denies                      [facility] Adult Mclaughlin Treatment/Exercise - 12/07/17 0001      Shoulder Exercises: Supine   Other Supine Exercises  snow angel arms at ~ 100 deg 2-3 min       Shoulder Exercises: Standing   Extension  Strengthening;Both;20 reps;Theraband    Theraband Level (Shoulder Extension)  Level 3 (Green)    Row  Strengthening;Both;20 reps;Theraband    Theraband Level (Shoulder Row)  Level 3 (Green)    Retraction  Strengthening;Both;20 reps;Theraband    Theraband Level (Shoulder Retraction)  Level 1 (Yellow)    Other Standing Exercises  axial extension 10 sec x 5; scap squeeze 10 sec x 10; L's x 10; W's x 10  with swim noodle       Shoulder Exercises: Stretch   Other Shoulder Stretches  3 way doorway stretch 30 sec x 2 each position       Moist Heat Therapy   Number Minutes Moist Heat  15 Minutes    Moist Heat Location  Shoulder Rt      Electrical Stimulation   Electrical Stimulation Location  Rt shoulder    Electrical Stimulation Action  IFC    Electrical Stimulation Parameters  to tolerance    Electrical Stimulation Goals  Pain;Tone      Manual Therapy   Manual therapy comments  Mclaughlin supine     Joint Mobilization  [place] joint circumduction     Soft tissue mobilization  deep tissue work through the Rt anterior chest/pecs; deltoid; biceps     Myofascial Release  anterior chest     Passive ROM  Rt shoudler              Mclaughlin Education - 12/07/17 1342    Education provided  Yes    Education Details  HEP     Person(s) Educated  Patient    Methods  Explanation;Demonstration;Tactile cues;Verbal  cues;Handout    Comprehension  Verbalized understanding;Returned demonstration;Verbal cues required;Tactile cues required          Mclaughlin Long Term Goals - 12/03/17 1556      Mclaughlin LONG TERM GOAL #1   Title  Improve posture and alignment with patient to demonstrate good upright posture with posterior shoulder girdle engaged 01/14/18    Time  6    Period  Weeks    Status  New      Mclaughlin LONG TERM GOAL #2   Title  Patient reports return to all functional activities involving Rt UE 01/14/18    Time  6    Period  Weeks    Status  New      Mclaughlin LONG TERM GOAL #3   Title  Independent in HEP 01/14/18    Time  6    Period  Weeks    Status  New      Mclaughlin LONG TERM GOAL #4   Title  return to the gym and her personal trainer without difficulty or increased pain 3/4 19    Time  6    Period  Weeks    Status  New      Mclaughlin LONG TERM GOAL #5   Title  improve FOTO =/< 29% limited 01/14/18    Time  6    Period  Weeks    Status  New            Plan - 12/07/17 1334    Clinical Impression Statement  [name]  presents today with improved Rt shoudler pain. She added prolonged stretch without difficulty today. Tolerated manual work well with good release of muscular tightness through pecs. Progressing well toward stated goals of therapy.     Rehab Potential  Good    Mclaughlin Frequency  2x / week    Mclaughlin Duration  6 weeks    Mclaughlin Treatment/Interventions  Patient/family education;ADLs/Self Care Home Management;Cryotherapy;Electrical Stimulation;Iontophoresis 4mg /ml Dexamethasone;Moist Heat;Ultrasound;Dry needling;Manual techniques;Neuromuscular re-education;Therapeutic activities;Therapeutic exercise    Mclaughlin Next Visit Plan  work on posture and alignment; review HEP; add prolonged pec stretch supine; manual work anterior Rt shoulder; posterior shoulder girdle strengthening; modlaities as indicated     Consulted and Agree with Plan of Care  Patient       Patient will benefit from skilled therapeutic intervention in order to improve the following deficits and impairments:  Postural dysfunction, Improper body mechanics, Pain, Increased muscle spasms, Increased fascial restricitons, Decreased activity tolerance  Visit Diagnosis: Acute pain of right shoulder  Other symptoms and signs involving the musculoskeletal system  Impaired functional mobility and endurance  Abnormal posture     Problem List Patient Active Problem List   Diagnosis Date Noted  . Acute pain of right shoulder 10/26/2017  . Watering of right eye 09/24/2017  . Right ankle pain 08/30/2017  . Chest pain, non-cardiac 08/17/2016  . Poison ivy dermatitis 07/10/2016  . Corn of foot 02/01/2016  . Hemorrhoids, external 12/07/2015  . Tendinitis of left extensor carpi ulnaris tendon 08/17/2015  . Trigger finger, acquired 11/17/2014  . Trochanteric bursitis of left hip 01/13/2014  . Benign essential hypertension 12/29/2013  . Spondylolisthesis of lumbar region 08/07/2013  . Seasonal allergies 04/24/2013  . Hyperlipidemia 04/24/2013  . Restless  legs 04/24/2013  . Knee osteoarthritis 04/24/2013  . Annual physical exam 04/24/2013    Heidi Mclaughlin, MPH  12/07/2017, 2:05 PM  [place],  [place], [address] Phone: [phone]   Fax:  [phone]  Name: Heidi Mclaughlin MRN: [id number] Date of Birth: 05-Dec-1955

## 2017-12-07 NOTE — Patient Instructions (Addendum)
Resisted External Rotation: in Neutral - Bilateral   PALMS UP Sit or stand, tubing in both hands, elbows at sides, bent to 90, forearms forward. Pinch shoulder blades together and rotate forearms out. Keep elbows at sides. Repeat __10__ times per set. Do _2-3___ sets per session. Do _2-3___ sessions per day.   Low Row: Standing   Face anchor, feet shoulder width apart. Palms up, pull arms back, squeezing shoulder blades together. Repeat 10__ times per set. Do 2-3__ sets per session. Do 2-3__ sessions per week. Anchor Height: Waist   Strengthening: Resisted Extension   Hold tubing in right hand, arm forward. Pull arm back, elbow straight. Repeat _10___ times per set. Do 2-3____ sets per session. Do 2-3____ sessions per day.    SUPINE Tips A    Being in the supine position means to be lying on the back. Lying on the back is the position of least compression on the bones and discs of the spine, and helps to re-align the natural curves of the back. 3-5 minutes

## 2017-12-11 ENCOUNTER — Ambulatory Visit: Payer: BLUE CROSS/BLUE SHIELD | Admitting: Rehabilitative and Restorative Service Providers"

## 2017-12-11 ENCOUNTER — Encounter: Payer: Self-pay | Admitting: Rehabilitative and Restorative Service Providers"

## 2017-12-11 DIAGNOSIS — R293 Abnormal posture: Secondary | ICD-10-CM | POA: Diagnosis not present

## 2017-12-11 DIAGNOSIS — M25511 Pain in right shoulder: Secondary | ICD-10-CM | POA: Diagnosis not present

## 2017-12-11 DIAGNOSIS — R29898 Other symptoms and signs involving the musculoskeletal system: Secondary | ICD-10-CM | POA: Diagnosis not present

## 2017-12-11 DIAGNOSIS — Z7409 Other reduced mobility: Secondary | ICD-10-CM

## 2017-12-11 NOTE — Patient Instructions (Addendum)
Scapular Retraction:  (Standing) back against wall     With arms elevated and elbows bent to 90, rest the back of hands on the wall, pinch shoulder blades together and press arms back. Make circles CW/CCW and jumping jack motion, Repeat __1-2 min. Do _1-2___ sets per session. Do __1__ sessions per day.

## 2017-12-11 NOTE — Therapy (Addendum)
[place], [place], [address] Phone: [phone]   Fax:  [phone]  Physical Therapy Treatment  Patient Details  Name: Heidi Mclaughlin MRN: [id number] Date of Birth: [birth year] Referring Provider: Dr Dianah Field   Encounter Date: 12/11/2017  PT End of Session - 12/11/17 1101    Visit Number  3    Number of Visits  12    Date for PT Re-Evaluation  01/14/18    PT Start Time  1100    PT Stop Time  1153    PT Time Calculation (min)  53 min    Activity Tolerance  Patient tolerated treatment well       Past Medical History:  Diagnosis Date  . Allergy   . Allergy-induced asthma   . Arthritis   . Hyperlipidemia   . Hypertension   . RLS (restless legs syndrome)     Past Surgical History:  Procedure Laterality Date  . ABDOMINAL HYSTERECTOMY  2003  . APPENDECTOMY  1982  . CHOLECYSTECTOMY  1982  . INCONTINENCE SURGERY  2003  . [place]  . TUBAL LIGATION  1992    There were no vitals filed for this visit.  Subjective Assessment - 12/11/17 1103    Subjective  Patient reports that she had a little pain in the Rt shoulder area yesterday and today. Overall has done well.     Currently in Pain?  Yes    Pain Score  3     Pain Location  Shoulder    Pain Orientation  Right    Pain Descriptors / Indicators  Aching;Dull;Nagging                      [facility] Adult PT Treatment/Exercise - 12/11/17 0001      Shoulder Exercises: Standing   Extension  Strengthening;Both;20 reps;Theraband    Theraband Level (Shoulder Extension)  Level 3 (Green)    Row  Strengthening;Both;20 reps;Theraband    Theraband Level (Shoulder Row)  Level 3 (Green)    Retraction  Strengthening;Both;20 reps;Theraband    Theraband Level (Shoulder Retraction)  Level 1 (Yellow)    Other Standing Exercises  axial extension 10 sec x 5; scap squeeze 10 sec x 10; L's x 10; W's x 10 with swim  noodle     Other Standing Exercises  standing with back against wall arms at 90/90 elbow/shoulders dorsumm of hands on wall making circles CW/CCW, sliding hands up and down wall (jumping jack motion)       Shoulder Exercises: ROM/Strengthening   UBE (Upper Arm Bike)  L2 x 4       Shoulder Exercises: Stretch   Other Shoulder Stretches  3 way doorway stretch 30 sec x 2 each position       Moist Heat Therapy   Number Minutes Moist Heat  20 Minutes    Moist Heat Location  Shoulder;Cervical      Electrical Stimulation   Electrical Stimulation Location  Rt cervical and shoulder     Electrical Stimulation Action  IFC    Electrical Stimulation Parameters  to tolerance    Electrical Stimulation Goals  Pain;Tone      Manual Therapy   Manual therapy comments  pt supine     Joint Mobilization  GH joint circumduction     Soft tissue mobilization  deep tissue work through the Rt anterior/lateral/posterior cervical musculature into the Rt upper trap/leveator    Myofascial Release  Rt lateral cervical     Passive ROM  passive stretch through Rt UE with head laterally flexed to Lt 30 sec hold x 2              PT Education - 12/11/17 1115    Education provided  Yes    Education Details  HEP     Person(s) Educated  Patient    Methods  Explanation;Demonstration;Tactile cues;Verbal cues;Handout    Comprehension  Verbalized understanding;Returned demonstration;Verbal cues required;Tactile cues required          PT Long Term Goals - 12/11/17 1102      PT LONG TERM GOAL #1   Title  Improve posture and alignment with patient to demonstrate good upright posture with posterior shoulder girdle engaged 01/14/18    Time  6    Period  Weeks    Status  Partially Met      PT LONG TERM GOAL #2   Title  Patient reports return to all functional activities involving Rt UE 01/14/18    Time  6    Period  Weeks    Status  Achieved      PT LONG TERM GOAL #3   Title  Independent in HEP 01/14/18    Time   6    Period  Weeks    Status  Achieved      PT LONG TERM GOAL #4   Title  return to the gym and her personal trainer without difficulty or increased pain 3/4 19    Time  6    Period  Weeks    Status  On-going      PT LONG TERM GOAL #5   Title  improve FOTO =/< 29% limited 01/14/18    Time  6    Period  Weeks    Status  On-going            Plan - 12/11/17 1107    Clinical Impression Statement  Heidi Mclaughlin has stopped taking the celebrex and is still feeling ok. She has noticed some increased pain in the Rt shoulder area. Good ROM and strength. Continued muscular tightness Rt lateral cervical and shoudler musculature. Progressing well toward independent management of symptoms     Rehab Potential  Good    PT Frequency  2x / week    PT Duration  6 weeks    PT Treatment/Interventions  Patient/family education;ADLs/Self Care Home Management;Cryotherapy;Electrical Stimulation;Iontophoresis '4mg'$ /ml Dexamethasone;Moist Heat;Ultrasound;Dry needling;Manual techniques;Neuromuscular re-education;Therapeutic activities;Therapeutic exercise    PT Next Visit Plan  work on posture and alignment; review HEP; add prolonged pec stretch supine; manual work anterior Rt shoulder; posterior shoulder girdle strengthening; modlaities as indicated  Pt will call to schedule additional appointments as needed     Consulted and Agree with Plan of Care  Patient       Patient will benefit from skilled therapeutic intervention in order to improve the following deficits and impairments:  Postural dysfunction, Improper body mechanics, Pain, Increased muscle spasms, Increased fascial restricitons, Decreased activity tolerance  Visit Diagnosis: Acute pain of right shoulder  Other symptoms and signs involving the musculoskeletal system  Impaired functional mobility and endurance  Abnormal posture     Problem List Patient Active Problem List   Diagnosis Date Noted  . Acute pain of right shoulder 10/26/2017  .  Watering of right eye 09/24/2017  . Right ankle pain 08/30/2017  . Chest pain, non-cardiac 08/17/2016  . Poison ivy dermatitis 07/10/2016  . Corn of foot 02/01/2016  .  Hemorrhoids, external 12/07/2015  . Tendinitis of left extensor carpi ulnaris tendon 08/17/2015  . Trigger finger, acquired 11/17/2014  . Trochanteric bursitis of left hip 01/13/2014  . Benign essential hypertension 12/29/2013  . Spondylolisthesis of lumbar region 08/07/2013  . Seasonal allergies 04/24/2013  . Hyperlipidemia 04/24/2013  . Restless legs 04/24/2013  . Knee osteoarthritis 04/24/2013  . Annual physical exam 04/24/2013    Neel Buffone Nilda Simmer PT, MPH  12/11/2017, 11:42 AM  [facility] [place], [place], [address] Phone: [phone]   Fax:  [phone]  Name: Heidi Mclaughlin MRN: [id number] Date of Birth: 04-Jun-1956  PHYSICAL THERAPY DISCHARGE SUMMARY  Visits from Start of Care: 3  Current functional level related to goals / functional outcomes: See progress note for discharge status   Remaining deficits: No known deficits   Education / Equipment: HEP  Plan: Patient agrees to discharge.  Patient goals were met. Patient is being discharged due to meeting the stated rehab goals.  ?????    Clay Solum P. Helene Kelp PT, MPH 12/20/17 2:30 PM

## 2017-12-13 ENCOUNTER — Encounter: Payer: BLUE CROSS/BLUE SHIELD | Admitting: Rehabilitative and Restorative Service Providers"

## 2017-12-20 NOTE — Therapy (Addendum)
[place], [place], [address] Phone: [phone]   Fax:  [phone]  Physical Therapy Treatment  Patient Details  Name: Heidi Mclaughlin MRN: [id number] Date of Birth: 05/24/1956 Referring Provider: Dr Dianah Field   Encounter Date: 12/03/2017    Past Medical History:  Diagnosis Date  . Allergy   . Allergy-induced asthma   . Arthritis   . Hyperlipidemia   . Hypertension   . RLS (restless legs syndrome)     Past Surgical History:  Procedure Laterality Date  . ABDOMINAL HYSTERECTOMY  2003  . APPENDECTOMY  1982  . CHOLECYSTECTOMY  1982  . INCONTINENCE SURGERY  2003  . [place]  . TUBAL LIGATION  1992    There were no vitals filed for this visit.                                PT Long Term Goals - 12/11/17 1102      PT LONG TERM GOAL #1   Title  Improve posture and alignment with patient to demonstrate good upright posture with posterior shoulder girdle engaged 01/14/18    Time  6    Period  Weeks    Status  Partially Met      PT LONG TERM GOAL #2   Title  Patient reports return to all functional activities involving Rt UE 01/14/18    Time  6    Period  Weeks    Status  Achieved      PT LONG TERM GOAL #3   Title  Independent in HEP 01/14/18    Time  6    Period  Weeks    Status  Achieved      PT LONG TERM GOAL #4   Title  return to the gym and her personal trainer without difficulty or increased pain 3/4 19    Time  6    Period  Weeks    Status  On-going      PT LONG TERM GOAL #5   Title  improve FOTO =/< 29% limited 01/14/18    Time  6    Period  Weeks    Status  On-going              Patient will benefit from skilled therapeutic intervention in order to improve the following deficits and impairments:  Postural dysfunction, Improper body mechanics, Pain, Increased muscle spasms, Increased fascial restricitons,  Decreased activity tolerance  Visit Diagnosis: Acute pain of right shoulder - Plan: PT plan of care cert/re-cert  Other symptoms and signs involving the musculoskeletal system - Plan: PT plan of care cert/re-cert  Impaired functional mobility and endurance - Plan: PT plan of care cert/re-cert  Abnormal posture - Plan: PT plan of care cert/re-cert     Problem List Patient Active Problem List   Diagnosis Date Noted  . Acute pain of right shoulder 10/26/2017  . Watering of right eye 09/24/2017  . Right ankle pain 08/30/2017  . Chest pain, non-cardiac 08/17/2016  . Poison ivy dermatitis 07/10/2016  . Corn of foot 02/01/2016  . Hemorrhoids, external 12/07/2015  . Tendinitis of left extensor carpi ulnaris tendon 08/17/2015  . Trigger finger, acquired 11/17/2014  . Trochanteric bursitis of left hip 01/13/2014  . Benign essential hypertension 12/29/2013  . Spondylolisthesis of lumbar region 08/07/2013  . Seasonal allergies 04/24/2013  . Hyperlipidemia 04/24/2013  . Restless  legs 04/24/2013  . Knee osteoarthritis 04/24/2013  . Annual physical exam 04/24/2013    Heidi Mclaughlin Heidi Mclaughlin 12/20/2017, 2:26 PM  [facility] [place] [address], [place], [address] Phone: [phone]   Fax:  [phone]

## 2017-12-29 ENCOUNTER — Other Ambulatory Visit: Payer: Self-pay

## 2017-12-29 ENCOUNTER — Emergency Department (INDEPENDENT_AMBULATORY_CARE_PROVIDER_SITE_OTHER)
Admission: EM | Admit: 2017-12-29 | Discharge: 2017-12-29 | Disposition: A | Discharge status: Home / Self Care | Payer: BLUE CROSS/BLUE SHIELD | Source: Home / Self Care

## 2017-12-29 ENCOUNTER — Encounter: Payer: Self-pay | Admitting: Emergency Medicine

## 2017-12-29 ENCOUNTER — Telehealth: Payer: Self-pay | Admitting: Emergency Medicine

## 2017-12-29 DIAGNOSIS — R072 Precordial pain: Secondary | ICD-10-CM | POA: Diagnosis not present

## 2017-12-29 MED ORDER — GI COCKTAIL ~~LOC~~
30.0000 mL | Freq: Once | ORAL | Status: AC
  Administered 2017-12-29: 30 mL via ORAL

## 2017-12-29 NOTE — Discharge Instructions (Signed)
Recommend taking Prilosec OTC, one cap daily 20 minutes before eating.

## 2017-12-29 NOTE — ED Notes (Signed)
1630: called [facility] to check on STAT lab status pick up; said they would assign another driver. PK 1830: called [facility]/ Vonna Kotyk to report ongoing need for lab pick up. Phone number for Dr.Beese on lab requisition .PK [phone]: Lab here to pick up STAT lab.pk

## 2017-12-29 NOTE — ED Triage Notes (Signed)
Spoke with lab regarding stat troponin conf [id number]

## 2017-12-29 NOTE — ED Provider Notes (Signed)
[facility]    CSN: [id number] Arrival date & time: 12/29/17  1411     History   Chief Complaint Chief Complaint  Patient presents with  . Chest Pain    HPI Heidi Mclaughlin is a 62 y.o. female.   Patient complains of onset of palpitations and chest pressure at about 1am last night.  She had been dancing and drinking champagne earlier in the evening but was assymptomatic upon going to bed.  Upon awakening this morning she had a vague pressure-like sensation in her anterior chest.  No nausea/vomiting.  No shortness of breath.  No lower leg pain or swelling.  She has a history of hiatal hernia.  She had a negative cardiac stress test last year.  She has a family history of CVD (father deceased MI).  She has hyperlipidemia, controlled.   The history is provided by the patient.  Chest Pain  Pain location:  L chest Pain quality: aching   Pain radiates to:  L arm Pain severity:  Mild Onset quality:  Gradual Duration:  90 minutes Timing:  Sporadic Progression:  Improving Chronicity:  New Context: at rest   Relieved by:  None tried Worsened by:  Nothing Ineffective treatments:  None tried Associated symptoms: AICD problem and palpitations   Associated symptoms: no abdominal pain, no anorexia, no anxiety, no back pain, no claudication, no cough, no diaphoresis, no dizziness, no dysphagia, no fatigue, no fever, no lower extremity edema, no nausea, no near-syncope, no orthopnea, no shortness of breath, no syncope and no vomiting   Risk factors: high cholesterol and hypertension     Past Medical History:  Diagnosis Date  . Allergy   . Allergy-induced asthma   . Arthritis   . Hyperlipidemia   . Hypertension   . RLS (restless legs syndrome)     Patient Active Problem List   Diagnosis Date Noted  . Acute pain of right shoulder 10/26/2017  . Watering of right eye 09/24/2017  . Right ankle pain 08/30/2017  . Chest pain, non-cardiac 08/17/2016  . Poison ivy  dermatitis 07/10/2016  . Corn of foot 02/01/2016  . Hemorrhoids, external 12/07/2015  . Tendinitis of left extensor carpi ulnaris tendon 08/17/2015  . Trigger finger, acquired 11/17/2014  . Trochanteric bursitis of left hip 01/13/2014  . Benign essential hypertension 12/29/2013  . Spondylolisthesis of lumbar region 08/07/2013  . Seasonal allergies 04/24/2013  . Hyperlipidemia 04/24/2013  . Restless legs 04/24/2013  . Knee osteoarthritis 04/24/2013  . Annual physical exam 04/24/2013    Past Surgical History:  Procedure Laterality Date  . ABDOMINAL HYSTERECTOMY  2003  . APPENDECTOMY  1982  . CHOLECYSTECTOMY  1982  . INCONTINENCE SURGERY  2003  . [place]  . TUBAL LIGATION  1992    OB History    No data available       Home Medications    Prior to Admission medications   Medication Sig Start Date End Date Taking? Authorizing Provider  azelastine (OPTIVAR) 0.05 % ophthalmic solution Place 1-2 drops 2 (two) times daily into both eyes. 09/24/17   Silverio Decamp, MD  celecoxib (CELEBREX) 200 MG capsule One to 2 tablets by mouth daily as needed for pain. 11/26/17   Silverio Decamp, MD  cetirizine (ZYRTEC) 10 MG tablet Take 10 mg by mouth.    [provider]  fluticasone Asencion Islam) 50 MCG/ACT nasal spray INSTILL 1 SPRAY IN EACH NOSTRIL TWICE A DAY 07/19/17   Silverio Decamp,  MD  folic acid (FOLVITE) 1 MG tablet TAKE 1 TABLET (1 MG TOTAL) BY MOUTH DAILY. 02/07/17   Silverio Decamp, MD  hydrochlorothiazide (HYDRODIURIL) 25 MG tablet Take 1 tablet (25 mg total) by mouth daily. 02/07/17   Silverio Decamp, MD  hydrocortisone (ANUSOL-HC) 25 MG suppository Place 1 suppository (25 mg total) rectally 2 (two) times daily as needed for hemorrhoids or itching. 12/07/15   Silverio Decamp, MD  magnesium oxide (MAG-OX) 400 (241.3 Mg) MG tablet Take 2 tablets (800 mg total) by mouth at bedtime. Patient taking differently:  Take 1 tablet daily by mouth.  02/07/17   Silverio Decamp, MD  montelukast (SINGULAIR) 10 MG tablet TAKE 1 TABLET (10 MG TOTAL) BY MOUTH AT BEDTIME. 12/03/17   Silverio Decamp, MD  prednisoLONE acetate (PRED FORTE) 1 % ophthalmic suspension PUT 1 DROP RIGHT EYE TWICE DAILY 10/30/17   [provider]  rOPINIRole (REQUIP) 1 MG tablet Take 1 tablet (1 mg total) by mouth at bedtime. 10/26/17   Silverio Decamp, MD  simvastatin (ZOCOR) 40 MG tablet Take 1 tablet (40 mg total) by mouth daily. 02/07/17   Silverio Decamp, MD  valACYclovir (VALTREX) 500 MG tablet Take 1 tablet (500 mg total) by mouth as needed. 02/07/17   Silverio Decamp, MD  witch hazel-glycerin (TUCKS) pad Apply 1 application topically as needed. 12/07/15   Silverio Decamp, MD    Family History Family History  Problem Relation Age of Onset  . Cancer Mother        lung  . Hypertension Father   . Heart attack Father   . Prostate cancer Maternal Uncle   . Colon cancer Neg Hx   . Esophageal cancer Neg Hx   . Rectal cancer Neg Hx   . Stomach cancer Neg Hx     Social History Social History   Tobacco Use  . Smoking status: Former Smoker    Last attempt to quit: 11/13/1972    Years since quitting: 45.1  . Smokeless tobacco: Never Used  Substance Use Topics  . Alcohol use: Yes    Alcohol/week: 0.6 oz    Types: 1 Glasses of wine per week  . Drug use: No     Allergies   Amoxicillin; Penicillins; and Poison ivy extract [poison ivy extract]   Review of Systems Review of Systems  Constitutional: Negative for diaphoresis, fatigue and fever.  HENT: Negative for trouble swallowing.   Respiratory: Negative for cough and shortness of breath.   Cardiovascular: Positive for chest pain and palpitations. Negative for orthopnea, claudication, syncope and near-syncope.  Gastrointestinal: Negative for abdominal pain, anorexia, nausea and vomiting.  Musculoskeletal: Negative for back pain.    Neurological: Negative for dizziness.  All other systems reviewed and are negative.    Physical Exam Triage Vital Signs ED Triage Vitals  Enc Vitals Group     BP 12/29/17 1440 121/81     Pulse Rate 12/29/17 1440 66     Resp 12/29/17 1440 16     Temp 12/29/17 1440 (!) 97.4 F (36.3 C)     Temp Source 12/29/17 1440 Oral     SpO2 12/29/17 1440 96 %     Weight 12/29/17 1441 156 lb (70.8 kg)     Height 12/29/17 1441 5\' 2"  (1.575 m)     Head Circumference --      Peak Flow --      Pain Score 12/29/17 1441 1     Pain  Loc --      Pain Edu? --      Excl. in [place]? --    No data found.  Updated Vital Signs BP 121/81 (BP Location: Left Arm)   Pulse 66   Temp (!) 97.4 F (36.3 C) (Oral)   Resp 16   Ht 5\' 2"  (1.575 m)   Wt 156 lb (70.8 kg)   SpO2 96%   BMI 28.53 kg/m   Visual Acuity Right Eye Distance:   Left Eye Distance:   Bilateral Distance:    Right Eye Near:   Left Eye Near:    Bilateral Near:     Physical Exam Nursing notes and Vital Signs reviewed. Appearance:  Patient appears stated age, and in no acute distress.  She is alert and oriented.   Eyes:  Pupils are equal, round, and reactive to light and accomodation.  Extraocular movement is intact.  Conjunctivae are not inflamed   Pharynx:  Normal; moist mucous membranes  Neck:  Supple.  No adenopathy Lungs:  Clear to auscultation.  Breath sounds are equal.  Moving air well. Chest:  No tenderness to palpation  Heart:  Regular rate and rhythm without murmurs, rubs, or gallops.  Abdomen:  Nontender without masses or hepatosplenomegaly.  Bowel sounds are present.  No CVA or flank tenderness.  Extremities:  No edema.  No calf tenderness. Skin:  Warm and dry.  No rash present.     UC Treatments / Results  Labs (all labs ordered are listed, but only abnormal results are displayed) Labs Reviewed  TROPONIN I    EKG  EKG Interpretation  Rate:  61 BPM PR:  170 msec QT:  428 msec QTcH:  430 msec QRSD:  86  msec QRS axis:  86 degrees Interpretation:  Normal sinus rhythm.  "T wave abnormality; consider anterior ischema"  Review of EKG performed 17 August 2016:  Similar to today's EKG      Radiology No results found.  Procedures Procedures (including critical care time)  Medications Ordered in UC Medications - No data to display   Initial Impression / Assessment and Plan / UC Course  I have reviewed the triage vital signs and the nursing notes.  Pertinent labs & imaging results that were available during my care of the patient were reviewed by me and considered in my medical decision making (see chart for details).    Patient's chest symptoms resolved after GI coctail.  Suspect GERD. Today's EKG similar to previous. As a precaution will order Troponin I Recommend taking Prilosec OTC, one cap daily 20 minutes before eating. If symptoms recur or become worse, recommend proceeding to hospital ER.    Final Clinical Impressions(s) / UC Diagnoses   Final diagnoses:  Substernal precordial chest pain    ED Discharge Orders    None          Kandra Nicolas, MD 12/30/17 1220

## 2017-12-29 NOTE — ED Triage Notes (Signed)
Patient had chest pressure with pain in left arm last night; she also had severe sense of pounding of her heart unlike her normal occasional irregularities;  it dissipated in about 1 1/2 hours but today she just does not feel right; some pressure in her chest; laughing and talking with family during interview.

## 2017-12-30 ENCOUNTER — Telehealth: Payer: Self-pay | Admitting: Emergency Medicine

## 2017-12-30 LAB — TROPONIN I: TROPONIN I: 0.35 ng/mL — AB (ref <=0.0)

## 2017-12-30 NOTE — Telephone Encounter (Signed)
Spoke with patient who states she has not had any more chest pain/discomfort since yesterday. Per Dr. Assunta Found: asked her to come in for series #2 troponin lab draw. She will do this asap.

## 2017-12-30 NOTE — Telephone Encounter (Signed)
Spoke with patient and told her lab results per Dr.Beese's interpretation; suggested she see Dr.T in a.m. for a 9:00am appt. for follow up; she agrees to this plan; has had no further chest pain/pressure; aware of red flags to go to ER.

## 2017-12-30 NOTE — Telephone Encounter (Signed)
New encounter for lab draw at 1:19pm troponin; called [facility] manager and requested STAT pick up and reported that results from yesterday's lab for this patient were never called to [facility] despite highlighting his number on lab requisition and notifying dispatcher that he should be called after hours. Spoke with [employer].

## 2017-12-31 ENCOUNTER — Encounter: Payer: Self-pay | Admitting: Sports Medicine

## 2017-12-31 ENCOUNTER — Ambulatory Visit: Payer: BLUE CROSS/BLUE SHIELD | Admitting: Sports Medicine

## 2017-12-31 DIAGNOSIS — R079 Chest pain, unspecified: Secondary | ICD-10-CM

## 2017-12-31 DIAGNOSIS — R072 Precordial pain: Secondary | ICD-10-CM | POA: Diagnosis not present

## 2017-12-31 DIAGNOSIS — I493 Ventricular premature depolarization: Secondary | ICD-10-CM | POA: Diagnosis not present

## 2017-12-31 DIAGNOSIS — I200 Unstable angina: Secondary | ICD-10-CM | POA: Diagnosis not present

## 2017-12-31 DIAGNOSIS — I10 Essential (primary) hypertension: Secondary | ICD-10-CM | POA: Diagnosis not present

## 2017-12-31 LAB — COMPREHENSIVE METABOLIC PANEL WITH GFR
AG Ratio: 2.3 (calc) (ref 1.0–2.5)
AST: 17 U/L (ref 10–35)
Alkaline phosphatase (APISO): 45 U/L (ref 33–130)
Chloride: 104 mmol/L (ref 98–110)
Creat: 0.84 mg/dL (ref 0.50–0.99)
Globulin: 1.9 g/dL (ref 1.9–3.7)
Glucose, Bld: 95 mg/dL (ref 65–99)
Potassium: 4.2 mmol/L (ref 3.5–5.3)

## 2017-12-31 LAB — CBC
HCT: 41.9 % (ref 35.0–45.0)
Hemoglobin: 14.2 g/dL (ref 11.7–15.5)
MCH: 29 pg (ref 27.0–33.0)
MCHC: 33.9 g/dL (ref 32.0–36.0)
MCV: 85.7 fL (ref 80.0–100.0)
MPV: 10 fL (ref 7.5–12.5)
Platelets: 274 Thousand/uL (ref 140–400)
RBC: 4.89 Million/uL (ref 3.80–5.10)
RDW: 12.4 % (ref 11.0–15.0)
WBC: 5.8 10*3/uL (ref 3.8–10.8)

## 2017-12-31 LAB — COMPREHENSIVE METABOLIC PANEL
ALT: 23 U/L (ref 6–29)
Albumin: 4.3 g/dL (ref 3.6–5.1)
BUN: 10 mg/dL (ref 7–25)
CO2: 30 mmol/L (ref 20–32)
Calcium: 9.8 mg/dL (ref 8.6–10.4)
Sodium: 139 mmol/L (ref 135–146)
Total Bilirubin: 0.5 mg/dL (ref 0.2–1.2)
Total Protein: 6.2 g/dL (ref 6.1–8.1)

## 2017-12-31 LAB — LIPASE: Lipase: 16 U/L (ref 7–60)

## 2017-12-31 LAB — TSH: TSH: 1.49 mIU/L (ref 0.40–4.50)

## 2017-12-31 LAB — TROPONIN I: Troponin I: 0.05 ng/mL (ref <=0.0)

## 2017-12-31 LAB — AMYLASE: Amylase: 32 U/L (ref 21–101)

## 2017-12-31 NOTE — Progress Notes (Signed)
Subjective:    CC: Chest pain  HPI: This is a pleasant 62 year old female, she has been well from a cardiovascular standpoint, this weekend she went to a rotary club second chance prom, stuck in some champagne, drink about 13-16 ounces, and danced the night away.  She never had any chest discomfort.  Historically when exercising she also does not get any chest discomfort.  She also had a negative stress echo less than 1 year ago.  When she got home and laid flat at night she felt some palpitations, as well as some chest pressure with radiation to the left arm.  No nausea, no diaphoresis.  This lasted until the morning, and then resolved.  She was seen in urgent care, she did have a stat troponin drawn that was mildly elevated.  Denies any nausea, vomiting, diarrhea, no melena, hematochezia.  She does have a history of a hiatal hernia, and is now taking omeprazole.  I reviewed the past medical history, family history, social history, surgical history, and allergies today and no changes were needed.  Please see the problem list section below in epic for further details.  Past Medical History: Past Medical History:  Diagnosis Date  . Allergy   . Allergy-induced asthma   . Arthritis   . Hyperlipidemia   . Hypertension   . RLS (restless legs syndrome)    Past Surgical History: Past Surgical History:  Procedure Laterality Date  . ABDOMINAL HYSTERECTOMY  2003  . APPENDECTOMY  1982  . CHOLECYSTECTOMY  1982  . INCONTINENCE SURGERY  2003  . [place]  . TUBAL LIGATION  1992   Social History: Social History   Socioeconomic History  . Marital status: Married    Spouse name: Not on file  . Number of children: Not on file  . Years of education: Not on file  . Highest education level: Not on file  Social Needs  . Financial resource strain: Not on file  . Food insecurity - worry: Not on file  . Food insecurity - inability: Not on file  . Transportation needs -  medical: Not on file  . Transportation needs - non-medical: Not on file  Occupational History  . Not on file  Tobacco Use  . Smoking status: Former Smoker    Last attempt to quit: 11/13/1972    Years since quitting: 45.1  . Smokeless tobacco: Never Used  Substance and Sexual Activity  . Alcohol use: Yes    Alcohol/week: 0.6 oz    Types: 1 Glasses of wine per week  . Drug use: No  . Sexual activity: Not Currently  Other Topics Concern  . Not on file  Social History Narrative  . Not on file   Family History: Family History  Problem Relation Age of Onset  . Cancer Mother        lung  . Hypertension Father   . Heart attack Father   . Prostate cancer Maternal Uncle   . Colon cancer Neg Hx   . Esophageal cancer Neg Hx   . Rectal cancer Neg Hx   . Stomach cancer Neg Hx    Allergies: Allergies  Allergen Reactions  . Amoxicillin Anaphylaxis  . Penicillins Anaphylaxis  . Pentazocine-Naloxone Anxiety and Other (See Comments)    insomnia insomnia   . Poison Ivy Extract [Poison Ivy Extract] Rash   Medications: See med rec.  Review of Systems: No fevers, chills, night sweats, weight loss, chest pain, or shortness of breath.  Objective:    General: Well Developed, well nourished, and in no acute distress.  Neuro: Alert and oriented x3, extra-ocular muscles intact, sensation grossly intact.  HEENT: Normocephalic, atraumatic, pupils equal round reactive to light, neck supple, no masses, no lymphadenopathy, thyroid nonpalpable.  Skin: Warm and dry, no rashes. Cardiac: Regular rate and rhythm, no murmurs rubs or gallops, no lower extremity edema.  Respiratory: Clear to auscultation bilaterally. Not using accessory muscles, speaking in full sentences. Abdomen: Soft, nontender, nondistended, normal bowel sounds, no palpable masses, no guarding, rigidity, rebound tenderness.  Twelve-lead ECG reviewed from the urgent care, she does have a slight change from her previous ECG about 2  years ago, she did have new T wave inversions at V3 and V4.  We repeated the ECG today, V4 T wave inversion has resolved but she still has the T wave inversion in V3.  Otherwise normal rhythm, normal rate, normal axis  Impression and Recommendations:    Chest pain Chest pain after a long night of drinking, dancing.   She did have a slight bump in her troponins, possibly demand ischemia. Negative stress echo in 2008, less than 1 year ago. She does have a change in her twelve-lead ECG, she has developed new T wave inversions in V3 and V4 from previous ECGs. Because this could represent unstable angina as it is a major change from her baseline I would like her to touch base again with her cardiologist, she may need a catheterization. Continue baby aspirin. She also had some palpitations at night and so I think she may be a good candidate for a Holter monitor. History of hiatal hernia, no symptoms or signs of GI bleed, continue with omeprazole. No exercising until we clear her.  ___________________________________________ Gwen Her. Dianah Field, M.D., ABFM., CAQSM. Primary Care and [place] Instructor of [place] of [facility]

## 2017-12-31 NOTE — Assessment & Plan Note (Signed)
Chest pain after a long night of drinking, dancing.   She did have a slight bump in her troponins, possibly demand ischemia. Negative stress echo in 2008, less than 1 year ago. She does have a change in her twelve-lead ECG, she has developed new T wave inversions in V3 and V4 from previous ECGs. Because this could represent unstable angina as it is a major change from her baseline I would like her to touch base again with her cardiologist, she may need a catheterization. Continue baby aspirin. She also had some palpitations at night and so I think she may be a good candidate for a Holter monitor. History of hiatal hernia, no symptoms or signs of GI bleed, continue with omeprazole. No exercising until we clear her.

## 2018-01-03 DIAGNOSIS — Z7982 Long term (current) use of aspirin: Secondary | ICD-10-CM | POA: Diagnosis not present

## 2018-01-03 DIAGNOSIS — Z880 Allergy status to penicillin: Secondary | ICD-10-CM | POA: Diagnosis not present

## 2018-01-03 DIAGNOSIS — Z8249 Family history of ischemic heart disease and other diseases of the circulatory system: Secondary | ICD-10-CM | POA: Diagnosis not present

## 2018-01-03 DIAGNOSIS — Z79899 Other long term (current) drug therapy: Secondary | ICD-10-CM | POA: Diagnosis not present

## 2018-01-03 DIAGNOSIS — Z87892 Personal history of anaphylaxis: Secondary | ICD-10-CM | POA: Diagnosis not present

## 2018-01-03 DIAGNOSIS — R072 Precordial pain: Secondary | ICD-10-CM | POA: Diagnosis not present

## 2018-01-03 DIAGNOSIS — R9439 Abnormal result of other cardiovascular function study: Secondary | ICD-10-CM | POA: Diagnosis not present

## 2018-01-03 DIAGNOSIS — Z7951 Long term (current) use of inhaled steroids: Secondary | ICD-10-CM | POA: Diagnosis not present

## 2018-01-03 DIAGNOSIS — R079 Chest pain, unspecified: Secondary | ICD-10-CM | POA: Diagnosis not present

## 2018-01-03 DIAGNOSIS — I493 Ventricular premature depolarization: Secondary | ICD-10-CM | POA: Diagnosis not present

## 2018-01-03 DIAGNOSIS — I10 Essential (primary) hypertension: Secondary | ICD-10-CM | POA: Diagnosis not present

## 2018-01-07 ENCOUNTER — Ambulatory Visit: Payer: BLUE CROSS/BLUE SHIELD | Admitting: Sports Medicine

## 2018-01-14 ENCOUNTER — Encounter: Payer: Self-pay | Admitting: Sports Medicine

## 2018-01-14 ENCOUNTER — Ambulatory Visit: Payer: BLUE CROSS/BLUE SHIELD | Admitting: Sports Medicine

## 2018-01-14 DIAGNOSIS — R079 Chest pain, unspecified: Secondary | ICD-10-CM

## 2018-01-14 DIAGNOSIS — Z0000 Encounter for general adult medical examination without abnormal findings: Secondary | ICD-10-CM | POA: Diagnosis not present

## 2018-01-14 NOTE — Assessment & Plan Note (Signed)
Negative cardiac catheterization last month. Likely had some demand ischemia. We will continue to control her risk factors, checking labs again today, but no further intervention needed, continue baby aspirin, cleared for exercise.

## 2018-01-14 NOTE — Assessment & Plan Note (Signed)
Hep C and HIV screening

## 2018-01-14 NOTE — Progress Notes (Signed)
Subjective:    CC: Follow-up  HPI: This is a pleasant 62 year old female, follow-up for chest pain, she had spent some time dancing, developed some chest tightness overnight, went to urgent care, troponins were slightly positive.  I saw her as a follow-up, troponins had trended downward, she did have some T wave inversions on her ECG in urgent care, 1 of the T wave inversions have resolved but one was still present.  We referred her to cardiology for concern for demand ischemia, she had a cardiac catheterization that was completely negative.  No further chest pain.  Lipids, A1c, blood pressure have all been well controlled.  I reviewed the past medical history, family history, social history, surgical history, and allergies today and no changes were needed.  Please see the problem list section below in epic for further details.  Past Medical History: Past Medical History:  Diagnosis Date  . Allergy   . Allergy-induced asthma   . Arthritis   . Hyperlipidemia   . Hypertension   . RLS (restless legs syndrome)    Past Surgical History: Past Surgical History:  Procedure Laterality Date  . ABDOMINAL HYSTERECTOMY  2003  . APPENDECTOMY  1982  . CHOLECYSTECTOMY  1982  . INCONTINENCE SURGERY  2003  . [place]  . TUBAL LIGATION  1992   Social History: Social History   Socioeconomic History  . Marital status: Married    Spouse name: None  . Number of children: None  . Years of education: None  . Highest education level: None  Social Needs  . Financial resource strain: None  . Food insecurity - worry: None  . Food insecurity - inability: None  . Transportation needs - medical: None  . Transportation needs - non-medical: None  Occupational History  . None  Tobacco Use  . Smoking status: Former Smoker    Last attempt to quit: 11/13/1972    Years since quitting: 45.2  . Smokeless tobacco: Never Used  Substance and Sexual Activity  . Alcohol use: Yes    Alcohol/week: 0.6 oz    Types: 1 Glasses of wine per week  . Drug use: No  . Sexual activity: Not Currently  Other Topics Concern  . None  Social History Narrative  . None   Family History: Family History  Problem Relation Age of Onset  . Cancer Mother        lung  . Hypertension Father   . Heart attack Father   . Prostate cancer Maternal Uncle   . Colon cancer Neg Hx   . Esophageal cancer Neg Hx   . Rectal cancer Neg Hx   . Stomach cancer Neg Hx    Allergies: Allergies  Allergen Reactions  . Amoxicillin Anaphylaxis  . Penicillins Anaphylaxis  . Pentazocine-Naloxone Anxiety and Other (See Comments)    insomnia insomnia   . Poison Ivy Extract [Poison Ivy Extract] Rash   Medications: See med rec.  Review of Systems: No fevers, chills, night sweats, weight loss, chest pain, or shortness of breath.   Objective:    General: Well Developed, well nourished, and in no acute distress.  Neuro: Alert and oriented x3, extra-ocular muscles intact, sensation grossly intact.  HEENT: Normocephalic, atraumatic, pupils equal round reactive to light, neck supple, no masses, no lymphadenopathy, thyroid nonpalpable.  Skin: Warm and dry, no rashes. Cardiac: Regular rate and rhythm, no murmurs rubs or gallops, no lower extremity edema.  Respiratory: Clear to auscultation bilaterally. Not using accessory muscles, speaking  in full sentences.  Impression and Recommendations:    Chest pain Negative cardiac catheterization last month. Likely had some demand ischemia. We will continue to control her risk factors, checking labs again today, but no further intervention needed, continue baby aspirin, cleared for exercise.  Annual physical exam Hep C and HIV screening ___________________________________________ Gwen Her. Dianah Field, M.D., ABFM., CAQSM. Primary Care and [place] Instructor of [place] of [facility]

## 2018-01-15 LAB — CBC
HCT: 43 % (ref 35.0–45.0)
Hemoglobin: 14.7 g/dL (ref 11.7–15.5)
MCH: 29.5 pg (ref 27.0–33.0)
MCHC: 34.2 g/dL (ref 32.0–36.0)
MCV: 86.3 fL (ref 80.0–100.0)
MPV: 10.2 fL (ref 7.5–12.5)
Platelets: 275 10*3/uL (ref 140–400)
RBC: 4.98 Million/uL (ref 3.80–5.10)
RDW: 12.5 % (ref 11.0–15.0)
WBC: 5.6 10*3/uL (ref 3.8–10.8)

## 2018-01-15 LAB — LIPID PANEL W/REFLEX DIRECT LDL
Cholesterol: 202 mg/dL — ABNORMAL HIGH (ref <200)
HDL: 85 mg/dL (ref >50)
LDL Cholesterol (Calc): 94 mg/dL (calc)
Non-HDL Cholesterol (Calc): 117 mg/dL (ref <130)
Total CHOL/HDL Ratio: 2.4 (calc) (ref <5.0)
Triglycerides: 132 mg/dL (ref <150)

## 2018-01-15 LAB — HEPATITIS C ANTIBODY
Hepatitis C Ab: NONREACTIVE
SIGNAL TO CUT-OFF: 0.01 (ref <1.00)

## 2018-01-15 LAB — COMPREHENSIVE METABOLIC PANEL
ALT: 26 U/L (ref 6–29)
Albumin: 4.5 g/dL (ref 3.6–5.1)
Alkaline phosphatase (APISO): 47 U/L (ref 33–130)
BUN: 12 mg/dL (ref 7–25)
Calcium: 9.7 mg/dL (ref 8.6–10.4)
Creat: 0.89 mg/dL (ref 0.50–0.99)
Potassium: 4.1 mmol/L (ref 3.5–5.3)
Sodium: 140 mmol/L (ref 135–146)
Total Bilirubin: 0.5 mg/dL (ref 0.2–1.2)
Total Protein: 6.6 g/dL (ref 6.1–8.1)

## 2018-01-15 LAB — HIV ANTIBODY (ROUTINE TESTING W REFLEX): HIV 1&2 Ab, 4th Generation: NONREACTIVE

## 2018-01-15 LAB — COMPREHENSIVE METABOLIC PANEL WITH GFR
AG Ratio: 2.1 (calc) (ref 1.0–2.5)
AST: 21 U/L (ref 10–35)
CO2: 27 mmol/L (ref 20–32)
Chloride: 103 mmol/L (ref 98–110)
Globulin: 2.1 g/dL (ref 1.9–3.7)
Glucose, Bld: 93 mg/dL (ref 65–99)

## 2018-01-15 LAB — TSH: TSH: 1.25 mIU/L (ref 0.40–4.50)

## 2018-01-15 LAB — HEMOGLOBIN A1C
Hgb A1c MFr Bld: 5.1 % of total Hgb (ref <5.7)
Mean Plasma Glucose: 100 (calc)
eAG (mmol/L): 5.5 (calc)

## 2018-01-17 ENCOUNTER — Other Ambulatory Visit: Payer: Self-pay | Admitting: Sports Medicine

## 2018-01-17 DIAGNOSIS — H04123 Dry eye syndrome of bilateral lacrimal glands: Secondary | ICD-10-CM | POA: Diagnosis not present

## 2018-02-12 ENCOUNTER — Other Ambulatory Visit: Payer: Self-pay | Admitting: Sports Medicine

## 2018-02-16 ENCOUNTER — Other Ambulatory Visit: Payer: Self-pay | Admitting: Sports Medicine

## 2018-02-27 ENCOUNTER — Other Ambulatory Visit: Payer: Self-pay | Admitting: Sports Medicine

## 2018-02-28 DIAGNOSIS — L918 Other hypertrophic disorders of the skin: Secondary | ICD-10-CM | POA: Diagnosis not present

## 2018-02-28 DIAGNOSIS — I781 Nevus, non-neoplastic: Secondary | ICD-10-CM | POA: Diagnosis not present

## 2018-02-28 DIAGNOSIS — L821 Other seborrheic keratosis: Secondary | ICD-10-CM | POA: Diagnosis not present

## 2018-02-28 DIAGNOSIS — D485 Neoplasm of uncertain behavior of skin: Secondary | ICD-10-CM | POA: Diagnosis not present

## 2018-03-01 ENCOUNTER — Other Ambulatory Visit: Payer: Self-pay | Admitting: Sports Medicine

## 2018-03-08 DIAGNOSIS — C44319 Basal cell carcinoma of skin of other parts of face: Secondary | ICD-10-CM | POA: Diagnosis not present

## 2018-03-08 DIAGNOSIS — R238 Other skin changes: Secondary | ICD-10-CM | POA: Diagnosis not present

## 2018-03-19 DIAGNOSIS — C44319 Basal cell carcinoma of skin of other parts of face: Secondary | ICD-10-CM | POA: Diagnosis not present

## 2018-03-19 DIAGNOSIS — L820 Inflamed seborrheic keratosis: Secondary | ICD-10-CM | POA: Diagnosis not present

## 2018-04-09 ENCOUNTER — Other Ambulatory Visit: Payer: Self-pay | Admitting: Sports Medicine

## 2018-05-14 ENCOUNTER — Encounter: Payer: Self-pay | Admitting: Physician Assistant

## 2018-05-14 ENCOUNTER — Ambulatory Visit: Payer: BLUE CROSS/BLUE SHIELD | Admitting: Physician Assistant

## 2018-05-14 VITALS — BP 143/83 | HR 60 | Temp 97.8°F | Wt 158.0 lb

## 2018-05-14 DIAGNOSIS — L989 Disorder of the skin and subcutaneous tissue, unspecified: Secondary | ICD-10-CM

## 2018-05-14 NOTE — Progress Notes (Signed)
HPI:                                                                Heidi Mclaughlin is a 62 y.o. female who presents to [place]: [place] today for skin lesion  Noticed a new skin lesion that she described as appearing similar to a bruise on her right upper chest wall 3 days ago. However, there was known known trauma/injury and the area was non-tender. Yesterday morning she noticed a "crusty" appearing lesion within the center of the larger bruise. Denies history of easy bruising/bleeding. No additional bruising on her body. She reports a history of an AK removed from her left temple early this year. No personal history of melanoma or skin cancer.   Depression screen PHQ 2/9 08/30/2017  Decreased Interest 0  Down, Depressed, Hopeless 0  PHQ - 2 Score 0    No flowsheet data found.    Past Medical History:  Diagnosis Date  . Allergy   . Allergy-induced asthma   . Arthritis   . Hyperlipidemia   . Hypertension   . RLS (restless legs syndrome)    Past Surgical History:  Procedure Laterality Date  . ABDOMINAL HYSTERECTOMY  2003  . APPENDECTOMY  1982  . CHOLECYSTECTOMY  1982  . INCONTINENCE SURGERY  2003  . [place]  . TUBAL LIGATION  1992   Social History   Tobacco Use  . Smoking status: Former Smoker    Last attempt to quit: 11/13/1972    Years since quitting: 45.5  . Smokeless tobacco: Never Used  Substance Use Topics  . Alcohol use: Yes    Alcohol/week: 0.6 oz    Types: 1 Glasses of wine per week   family history includes Cancer in her mother; Heart attack in her father; Hypertension in her father; Prostate cancer in her maternal uncle.    ROS: negative except as noted in the HPI  Medications: Current Outpatient Medications  Medication Sig Dispense Refill  . azelastine (OPTIVAR) 0.05 % ophthalmic solution Place 1-2 drops 2 (two) times daily into both eyes. 6 mL 11  . celecoxib (CELEBREX) 200  MG capsule ONE TO 2 TABLETS BY MOUTH DAILY AS NEEDED FOR PAIN. 60 capsule 2  . cetirizine (ZYRTEC) 10 MG tablet Take 10 mg by mouth.    . fluticasone (FLONASE) 50 MCG/ACT nasal spray INSTILL 1 SPRAY IN EACH NOSTRIL TWICE A DAY 48 g 0  . fluticasone (FLONASE) 50 MCG/ACT nasal spray PLACE 1 SPRAY INTO BOTH NOSTRILS 2 (TWO) TIMES DAILY. 48 g 0  . folic acid (FOLVITE) 1 MG tablet TAKE 1 TABLET (1 MG TOTAL) BY MOUTH DAILY. 90 tablet 2  . hydrochlorothiazide (HYDRODIURIL) 25 MG tablet TAKE 1 TABLET (25 MG TOTAL) BY MOUTH DAILY. 90 tablet 3  . hydrocortisone (ANUSOL-HC) 25 MG suppository Place 1 suppository (25 mg total) rectally 2 (two) times daily as needed for hemorrhoids or itching. 24 suppository 2  . Magnesium Oxide 400 (240 Mg) MG TABS TAKE 2 TABLETS (800 MG TOTAL) BY MOUTH AT BEDTIME. 90 tablet 2  . montelukast (SINGULAIR) 10 MG tablet TAKE 1 TABLET (10 MG TOTAL) BY MOUTH AT BEDTIME. 90 tablet 1  . prednisoLONE acetate (PRED FORTE) 1 % ophthalmic  suspension PUT 1 DROP RIGHT EYE TWICE DAILY  0  . rOPINIRole (REQUIP) 1 MG tablet Take 1 tablet (1 mg total) by mouth at bedtime. 90 tablet 3  . simvastatin (ZOCOR) 40 MG tablet TAKE 1 TABLET (40 MG TOTAL) BY MOUTH DAILY. 90 tablet 3  . valACYclovir (VALTREX) 500 MG tablet Take 1 tablet (500 mg total) by mouth as needed. 90 tablet 0  . witch hazel-glycerin (TUCKS) pad Apply 1 application topically as needed. 1 each 11   No current facility-administered medications for this visit.    Allergies  Allergen Reactions  . Amoxicillin Anaphylaxis  . Penicillins Anaphylaxis  . Pentazocine-Naloxone Anxiety and Other (See Comments)    insomnia insomnia   . Poison Ivy Extract [Poison Ivy Extract] Rash       Objective:  There were no vitals taken for this visit. Gen:  alert, not ill-appearing, no distress, appropriate for age 41: head normocephalic without obvious abnormality, conjunctiva and cornea clear, wearing glasses trachea midline Pulm:  Normal work of breathing, normal phonation Neuro: alert and oriented x 3, no tremor MSK: extremities atraumatic, normal gait and station Skin: right upper outer chest wall there is approx 1.5 cm x 1 cm ecchymoses with approx 4 mm x 4 mm scaly center with irregular borders    No results found for this or any previous visit (from the past 72 hour(s)). No results found.    Assessment and Plan: 62 y.o. female with   Skin lesion of chest wall - has the appearance of a traumatized SK/mole. No additional ecchymoses or easy bruising/bleeding. Recommend active surveillance     Patient education and anticipatory guidance given Patient agrees with treatment plan Follow-up in 2 weeks or sooner as needed if symptoms worsen or fail to improve  Darlyne Russian PA-C

## 2018-05-27 ENCOUNTER — Other Ambulatory Visit: Payer: Self-pay | Admitting: Sports Medicine

## 2018-05-29 ENCOUNTER — Ambulatory Visit: Payer: BLUE CROSS/BLUE SHIELD | Admitting: Physician Assistant

## 2018-07-10 ENCOUNTER — Ambulatory Visit (INDEPENDENT_AMBULATORY_CARE_PROVIDER_SITE_OTHER): Payer: BLUE CROSS/BLUE SHIELD

## 2018-07-10 ENCOUNTER — Ambulatory Visit: Payer: BLUE CROSS/BLUE SHIELD | Admitting: Sports Medicine

## 2018-07-10 ENCOUNTER — Encounter: Payer: Self-pay | Admitting: Sports Medicine

## 2018-07-10 DIAGNOSIS — J209 Acute bronchitis, unspecified: Secondary | ICD-10-CM | POA: Diagnosis not present

## 2018-07-10 DIAGNOSIS — R918 Other nonspecific abnormal finding of lung field: Secondary | ICD-10-CM | POA: Diagnosis not present

## 2018-07-10 DIAGNOSIS — R05 Cough: Secondary | ICD-10-CM | POA: Diagnosis not present

## 2018-07-10 DIAGNOSIS — R058 Other specified cough: Secondary | ICD-10-CM | POA: Insufficient documentation

## 2018-07-10 DIAGNOSIS — I700 Atherosclerosis of aorta: Secondary | ICD-10-CM

## 2018-07-10 MED ORDER — AZITHROMYCIN 250 MG PO TABS: ORAL_TABLET | ORAL | 0 refills | Status: DC

## 2018-07-10 MED ORDER — BENZONATATE 200 MG PO CAPS: 200.0000 mg | ORAL_CAPSULE | Freq: Three times a day (TID) | ORAL | 0 refills | Status: DC | PRN

## 2018-07-10 NOTE — Progress Notes (Addendum)
Subjective:    CC: Cough  HPI: This is a pleasant 62 year old female, for the past 5 days she has had a productive cough, yellowish mucus, no blood, no shortness of breath, wheezing.  No upper respirator symptoms, no GI symptoms or skin rash.  I reviewed the past medical history, family history, social history, surgical history, and allergies today and no changes were needed.  Please see the problem list section below in epic for further details.  Past Medical History: Past Medical History:  Diagnosis Date  . Allergy   . Allergy-induced asthma   . Arthritis   . Hyperlipidemia   . Hypertension   . RLS (restless legs syndrome)    Past Surgical History: Past Surgical History:  Procedure Laterality Date  . ABDOMINAL HYSTERECTOMY  2003  . APPENDECTOMY  1982  . CHOLECYSTECTOMY  1982  . INCONTINENCE SURGERY  2003  . [place]  . TUBAL LIGATION  1992   Social History: Social History   Socioeconomic History  . Marital status: Married    Spouse name: Not on file  . Number of children: Not on file  . Years of education: Not on file  . Highest education level: Not on file  Occupational History  . Not on file  Social Needs  . Financial resource strain: Not on file  . Food insecurity:    Worry: Not on file    Inability: Not on file  . Transportation needs:    Medical: Not on file    Non-medical: Not on file  Tobacco Use  . Smoking status: Former Smoker    Last attempt to quit: 11/13/1972    Years since quitting: 45.6  . Smokeless tobacco: Never Used  Substance and Sexual Activity  . Alcohol use: Yes    Alcohol/week: 1.0 standard drinks    Types: 1 Glasses of wine per week  . Drug use: No  . Sexual activity: Not Currently  Lifestyle  . Physical activity:    Days per week: Not on file    Minutes per session: Not on file  . Stress: Not on file  Relationships  . Social connections:    Talks on phone: Not on file    Gets together: Not on  file    Attends religious service: Not on file    Active member of club or organization: Not on file    Attends meetings of clubs or organizations: Not on file    Relationship status: Not on file  Other Topics Concern  . Not on file  Social History Narrative  . Not on file   Family History: Family History  Problem Relation Age of Onset  . Cancer Mother        lung  . Hypertension Father   . Heart attack Father   . Prostate cancer Maternal Uncle   . Colon cancer Neg Hx   . Esophageal cancer Neg Hx   . Rectal cancer Neg Hx   . Stomach cancer Neg Hx    Allergies: Allergies  Allergen Reactions  . Amoxicillin Anaphylaxis  . Penicillins Anaphylaxis  . Pentazocine-Naloxone Anxiety and Other (See Comments)    insomnia insomnia   . Poison Ivy Extract [Poison Ivy Extract] Rash   Medications: See med rec.  Review of Systems: No fevers, chills, night sweats, weight loss, chest pain, or shortness of breath.   Objective:    General: Well Developed, well nourished, and in no acute distress.  Neuro: Alert and oriented x3,  extra-ocular muscles intact, sensation grossly intact.  HEENT: Normocephalic, atraumatic, pupils equal round reactive to light, neck supple, no masses, no lymphadenopathy, thyroid nonpalpable.  Skin: Warm and dry, no rashes. Cardiac: Regular rate and rhythm, no murmurs rubs or gallops, no lower extremity edema.  Respiratory: Clear but has slightly coarse sounds in the left lower lobe.. Not using accessory muscles, speaking in full sentences.  Impression and Recommendations:    Acute bronchitis Present for 5 days. Coarse sounds in the left lower lobe, chest x-ray. Adding [employer], she does have a trip coming up, I am going to call in azithromycin for her to hold onto but she will use it unless symptoms persist more than a week and a half.  Nodularity in the lungs, this is most likely infectious, she should take her azithromycin, we will repeat a CXR in 1  month and if still present will proceed with CT for further evaluation. ___________________________________________ Gwen Her. Dianah Field, M.D., ABFM., CAQSM. Primary Care and [place] Instructor of [place] of [facility]

## 2018-07-10 NOTE — Assessment & Plan Note (Addendum)
Present for 5 days. Coarse sounds in the left lower lobe, chest x-ray. Adding [employer], she does have a trip coming up, I am going to call in azithromycin for her to hold onto but she will use it unless symptoms persist more than a week and a half.  Nodularity in the lungs, this is most likely infectious, she should take her azithromycin, we will repeat a CXR in 1 month and if still present will proceed with CT for further evaluation.

## 2018-07-23 DIAGNOSIS — C44319 Basal cell carcinoma of skin of other parts of face: Secondary | ICD-10-CM | POA: Diagnosis not present

## 2018-07-23 DIAGNOSIS — L821 Other seborrheic keratosis: Secondary | ICD-10-CM | POA: Diagnosis not present

## 2018-07-23 DIAGNOSIS — D2261 Melanocytic nevi of right upper limb, including shoulder: Secondary | ICD-10-CM | POA: Diagnosis not present

## 2018-08-13 ENCOUNTER — Other Ambulatory Visit: Payer: Self-pay | Admitting: Sports Medicine

## 2018-08-22 DIAGNOSIS — H52203 Unspecified astigmatism, bilateral: Secondary | ICD-10-CM | POA: Diagnosis not present

## 2018-08-22 DIAGNOSIS — H524 Presbyopia: Secondary | ICD-10-CM | POA: Diagnosis not present

## 2018-08-22 DIAGNOSIS — H2513 Age-related nuclear cataract, bilateral: Secondary | ICD-10-CM | POA: Diagnosis not present

## 2018-08-22 DIAGNOSIS — H04123 Dry eye syndrome of bilateral lacrimal glands: Secondary | ICD-10-CM | POA: Diagnosis not present

## 2018-09-18 DIAGNOSIS — Z803 Family history of malignant neoplasm of breast: Secondary | ICD-10-CM | POA: Diagnosis not present

## 2018-09-18 DIAGNOSIS — Z1231 Encounter for screening mammogram for malignant neoplasm of breast: Secondary | ICD-10-CM | POA: Diagnosis not present

## 2018-09-18 LAB — HM MAMMOGRAPHY

## 2018-09-19 ENCOUNTER — Telehealth: Payer: Self-pay | Admitting: *Deleted

## 2018-09-19 DIAGNOSIS — Z90710 Acquired absence of both cervix and uterus: Secondary | ICD-10-CM

## 2018-09-19 NOTE — Telephone Encounter (Signed)
Gyn referral placed per pt request.

## 2018-09-20 DIAGNOSIS — I781 Nevus, non-neoplastic: Secondary | ICD-10-CM | POA: Diagnosis not present

## 2018-10-14 DIAGNOSIS — H43391 Other vitreous opacities, right eye: Secondary | ICD-10-CM | POA: Diagnosis not present

## 2018-10-14 DIAGNOSIS — H04123 Dry eye syndrome of bilateral lacrimal glands: Secondary | ICD-10-CM | POA: Diagnosis not present

## 2018-10-17 ENCOUNTER — Other Ambulatory Visit: Payer: Self-pay | Admitting: Sports Medicine

## 2018-10-17 MED ORDER — VALACYCLOVIR HCL 500 MG PO TABS: 500.0000 mg | ORAL_TABLET | ORAL | 0 refills | Status: DC | PRN

## 2018-10-21 ENCOUNTER — Ambulatory Visit: Payer: BLUE CROSS/BLUE SHIELD | Admitting: Obstetrics & Gynecology

## 2018-10-21 ENCOUNTER — Encounter: Payer: Self-pay | Admitting: Obstetrics & Gynecology

## 2018-10-21 VITALS — BP 126/77 | HR 69 | Resp 16 | Ht 62.0 in | Wt 159.0 lb

## 2018-10-21 DIAGNOSIS — Z01411 Encounter for gynecological examination (general) (routine) with abnormal findings: Secondary | ICD-10-CM

## 2018-10-21 NOTE — Progress Notes (Signed)
Subjective:     Heidi Mclaughlin is a 62 y.o. female here for a routine exam.  Current complaints: None.     Gynecologic History No LMP recorded. Patient has had a hysterectomy. Contraception: post menopausal status Last Pap: s/p hysterectomy for benign reasons.  No history of abnormal paps.  No need for further cytology. Last mammogram: 08/2017. Results were: normal  Obstetric History OB History  Gravida Para Term Preterm AB Living  2 2 2         SAB TAB Ectopic Multiple Live Births          2    # Outcome Date GA Lbr Len/2nd Weight Sex Delivery Anes PTL Lv  2 Term      Vag-Spont     1 Term      Vag-Spont        The following portions of the patient's history were reviewed and updated as appropriate: allergies, current medications, past family history, past medical history, past social history, past surgical history and problem list.  Review of Systems Pertinent items noted in HPI and remainder of comprehensive ROS otherwise negative.    Objective:      Vitals:   10/21/18 0930  BP: 126/77  Pulse: 69  Resp: 16  Weight: 159 lb (72.1 kg)  Height: 5\' 2"  (1.575 m)   Vitals:  WNL General appearance: alert, cooperative and no distress  HEENT: Normocephalic, without obvious abnormality, atraumatic Eyes: negative Throat: lips, mucosa, and tongue normal; teeth and gums normal  Respiratory: Clear to auscultation bilaterally  CV: Regular rate and rhythm  Breasts:  Normal appearance, no masses or tenderness, no nipple retraction or dimpling  GI: Soft, non-tender; bowel sounds normal; no masses,  no organomegaly  GU: External Genitalia:  Tanner V, no lesion Urethra:  No prolapse   Vagina: Pale pink, mild atrophy, does not bother her; not sexually active  Cervix: Surgically absent  Uterus:  Surgically absent  Adnexa: Normal, no masses, non tender  Musculoskeletal: No edema, redness or tenderness in the calves or thighs  Skin: No lesions or rash  Lymphatic: Axillary adenopathy:  none     Psychiatric: Normal mood and behavior    Assessment:    Healthy female exam.    Plan:    1.  Yearly Mammogram 2.  Pap smear--not indicated s/p hysterectomy  3.  Bone Health--Menopause 10 years ago.  Up to date on bone density per patient at [facility]).  Takes vit D and ca++ 4.  Colonoscopy up to date (followed by PCP)  Last year was age 52.

## 2018-10-30 DIAGNOSIS — I781 Nevus, non-neoplastic: Secondary | ICD-10-CM | POA: Diagnosis not present

## 2018-10-31 ENCOUNTER — Encounter: Payer: Self-pay | Admitting: Sports Medicine

## 2018-11-11 ENCOUNTER — Other Ambulatory Visit: Payer: Self-pay | Admitting: Sports Medicine

## 2018-11-17 ENCOUNTER — Other Ambulatory Visit: Payer: Self-pay | Admitting: Sports Medicine

## 2018-12-04 DIAGNOSIS — H04123 Dry eye syndrome of bilateral lacrimal glands: Secondary | ICD-10-CM | POA: Diagnosis not present

## 2018-12-04 DIAGNOSIS — H43391 Other vitreous opacities, right eye: Secondary | ICD-10-CM | POA: Diagnosis not present

## 2018-12-09 ENCOUNTER — Other Ambulatory Visit: Payer: Self-pay | Admitting: Sports Medicine

## 2019-01-09 ENCOUNTER — Other Ambulatory Visit: Payer: Self-pay | Admitting: Sports Medicine

## 2019-01-10 ENCOUNTER — Other Ambulatory Visit: Payer: Self-pay | Admitting: Sports Medicine

## 2019-02-11 ENCOUNTER — Other Ambulatory Visit: Payer: Self-pay | Admitting: Sports Medicine

## 2019-03-20 ENCOUNTER — Telehealth (INDEPENDENT_AMBULATORY_CARE_PROVIDER_SITE_OTHER): Payer: Medicare Other | Admitting: Sports Medicine

## 2019-03-20 DIAGNOSIS — J302 Other seasonal allergic rhinitis: Secondary | ICD-10-CM | POA: Diagnosis not present

## 2019-03-20 DIAGNOSIS — I10 Essential (primary) hypertension: Secondary | ICD-10-CM

## 2019-03-20 DIAGNOSIS — Z0000 Encounter for general adult medical examination without abnormal findings: Secondary | ICD-10-CM | POA: Diagnosis not present

## 2019-03-20 DIAGNOSIS — Z20828 Contact with and (suspected) exposure to other viral communicable diseases: Secondary | ICD-10-CM | POA: Diagnosis not present

## 2019-03-20 DIAGNOSIS — Z20822 Contact with and (suspected) exposure to covid-19: Secondary | ICD-10-CM | POA: Insufficient documentation

## 2019-03-20 MED ORDER — ROPINIROLE HCL 1 MG PO TABS: 1.0000 mg | ORAL_TABLET | Freq: Every day | ORAL | 1 refills | Status: DC

## 2019-03-20 NOTE — Assessment & Plan Note (Signed)
Doing well right now with Singulair, Zyrtec, Flonase. No changes needed.

## 2019-03-20 NOTE — Assessment & Plan Note (Signed)
Recently got Medicare. She will schedule her welcome to Medicare exam sometime in the summer.

## 2019-03-20 NOTE — Progress Notes (Signed)
Virtual Visit via WebEx/MyChart    I connected with  Heidi Mclaughlin  on 03/20/19 via WebEx/MyChart/Doximity Video and verified that I am speaking with the correct person using two identifiers.   I discussed the limitations, risks, security and privacy concerns of performing an evaluation and management service by WebEx/MyChart/Doximity Video, including the higher likelihood of inaccurate diagnosis and treatment, and the availability of in person appointments.  We also discussed the likely need of an additional face to face encounter for complete and high quality delivery of care.  I also discussed with the patient that there may be a patient responsible charge related to this service. The patient expressed understanding and wishes to proceed.  Provider location is either at home or medical facility. Patient location is at their home, different from provider location. People involved in care of the patient during this telehealth encounter were myself, my nurse/medical assistant, and my front office/scheduling team member.  Subjective:    CC: Follow-up  HPI: This is a pleasant 63 year old female, she just got Medicare, she is going to schedule her welcome to Medicare exam.  She needs a refill on Requip, she also is due for some routine lab.  She does tell me that she was recently exposed to coronavirus, no one that she was exposed to actually tested positive.  She does desire testing.  Her allergy symptoms are well controlled.  I reviewed the past medical history, family history, social history, surgical history, and allergies today and no changes were needed.  Please see the problem list section below in epic for further details.  Past Medical History: Past Medical History:  Diagnosis Date  . Allergy   . Allergy-induced asthma   . Arthritis   . Hyperlipidemia   . Hypertension   . Irregular heart rhythm   . RLS (restless legs syndrome)    Past Surgical History: Past Surgical History:   Procedure Laterality Date  . ABDOMINAL HYSTERECTOMY  2003  . APPENDECTOMY  1982  . CHOLECYSTECTOMY  1982  . INCONTINENCE SURGERY  2003  . [place]  . TUBAL LIGATION  1992   Social History: Social History   Socioeconomic History  . Marital status: Married    Spouse name: Not on file  . Number of children: Not on file  . Years of education: Not on file  . Highest education level: Not on file  Occupational History  . Not on file  Social Needs  . Financial resource strain: Not on file  . Food insecurity:    Worry: Not on file    Inability: Not on file  . Transportation needs:    Medical: Not on file    Non-medical: Not on file  Tobacco Use  . Smoking status: Former Smoker    Last attempt to quit: 11/13/1972    Years since quitting: 46.3  . Smokeless tobacco: Never Used  Substance and Sexual Activity  . Alcohol use: Yes    Alcohol/week: 1.0 standard drinks    Types: 1 Glasses of wine per week  . Drug use: No  . Sexual activity: Not Currently    Birth control/protection: Surgical  Lifestyle  . Physical activity:    Days per week: Not on file    Minutes per session: Not on file  . Stress: Not on file  Relationships  . Social connections:    Talks on phone: Not on file    Gets together: Not on file    Attends religious service:  Not on file    Active member of club or organization: Not on file    Attends meetings of clubs or organizations: Not on file    Relationship status: Not on file  Other Topics Concern  . Not on file  Social History Narrative  . Not on file   Family History: Family History  Problem Relation Age of Onset  . Cancer Mother        lung  . Hypertension Father   . Heart attack Father   . Prostate cancer Maternal Uncle   . Colon cancer Neg Hx   . Esophageal cancer Neg Hx   . Rectal cancer Neg Hx   . Stomach cancer Neg Hx    Allergies: Allergies  Allergen Reactions  . Amoxicillin Anaphylaxis  . Penicillins  Anaphylaxis  . Pentazocine-Naloxone Anxiety and Other (See Comments)    insomnia insomnia   . Poison Ivy Extract [Poison Ivy Extract] Rash   Medications: See med rec.  Review of Systems: No fevers, chills, night sweats, weight loss, chest pain, or shortness of breath.   Objective:    General: Speaking full sentences, no audible heavy breathing.  Sounds alert and appropriately interactive.  Appears well.  Face symmetric.  Extraocular movements intact.  Pupils equal and round.  No nasal flaring or accessory muscle use visualized.  No other physical exam performed due to the non-physical nature of this visit.  Impression and Recommendations:    Annual physical exam Recently got Medicare. She will schedule her welcome to Medicare exam sometime in the summer.  Seasonal allergies Doing well right now with Singulair, Zyrtec, Flonase. No changes needed.  Exposure to 2019 Novel Coronavirus Patient tells me she was exposed to coronavirus, suspected, but no one tested positive. She would like antibody testing. I am going to add influenza antibodies as well per patient request.  Benign essential hypertension Rechecking labs, we will look at her blood pressure when she comes back into the office.   I discussed the above assessment and treatment plan with the patient. The patient was provided an opportunity to ask questions and all were answered. The patient agreed with the plan and demonstrated an understanding of the instructions.   The patient was advised to call back or seek an in-person evaluation if the symptoms worsen or if the condition fails to improve as anticipated.   I provided 25 minutes of non-face-to-face time during this encounter, 15 minutes of additional time was needed to gather information, review chart, records, communicate/coordinate with staff remotely, troubleshooting the multiple errors that we get every time when trying to do video calls through the electronic  medical record, WebEx, and Doximity, restart the encounter multiple times due to instability of the software, as well as complete documentation.   ___________________________________________ Gwen Her. Dianah Field, M.D., ABFM., CAQSM. Primary Care and Sports Medicine [facility]  Adjunct Professor of [place] [employer] [facility] [employer]

## 2019-03-20 NOTE — Assessment & Plan Note (Addendum)
Patient tells me she was exposed to coronavirus, suspected, but no one tested positive. She would like antibody testing. I am going to add influenza antibodies as well per patient request.

## 2019-03-20 NOTE — Assessment & Plan Note (Signed)
Rechecking labs, we will look at her blood pressure when she comes back into the office.

## 2019-03-24 DIAGNOSIS — I10 Essential (primary) hypertension: Secondary | ICD-10-CM | POA: Diagnosis not present

## 2019-03-24 DIAGNOSIS — Z20828 Contact with and (suspected) exposure to other viral communicable diseases: Secondary | ICD-10-CM | POA: Diagnosis not present

## 2019-03-24 NOTE — Addendum Note (Signed)
Addended by: Towana Badger on: 03/24/2019 08:27 AM   Modules accepted: Orders

## 2019-03-27 LAB — COMPLETE METABOLIC PANEL WITH GFR
AG Ratio: 2.4 (calc) (ref 1.0–2.5)
ALT: 25 U/L (ref 6–29)
AST: 19 U/L (ref 10–35)
Albumin: 4.4 g/dL (ref 3.6–5.1)
Alkaline phosphatase (APISO): 48 U/L (ref 37–153)
BUN: 15 mg/dL (ref 7–25)
CO2: 27 mmol/L (ref 20–32)
Calcium: 9.8 mg/dL (ref 8.6–10.4)
Chloride: 105 mmol/L (ref 98–110)
Creat: 0.85 mg/dL (ref 0.50–0.99)
GFR, Est African American: 84 mL/min/{1.73_m2} (ref >=60)
GFR, Est Non African American: 72 mL/min/{1.73_m2} (ref >=60)
Globulin: 1.8 g/dL (calc) — ABNORMAL LOW (ref 1.9–3.7)
Glucose, Bld: 101 mg/dL — ABNORMAL HIGH (ref 65–99)
Potassium: 4.4 mmol/L (ref 3.5–5.3)
Sodium: 141 mmol/L (ref 135–146)
Total Bilirubin: 0.4 mg/dL (ref 0.2–1.2)
Total Protein: 6.2 g/dL (ref 6.1–8.1)

## 2019-03-27 LAB — CBC
HCT: 43.4 % (ref 35.0–45.0)
Hemoglobin: 15.2 g/dL (ref 11.7–15.5)
MCH: 30.6 pg (ref 27.0–33.0)
MCHC: 35 g/dL (ref 32.0–36.0)
MCV: 87.3 fL (ref 80.0–100.0)
MPV: 10.5 fL (ref 7.5–12.5)
Platelets: 260 10*3/uL (ref 140–400)
RBC: 4.97 10*6/uL (ref 3.80–5.10)
RDW: 12.8 % (ref 11.0–15.0)
WBC: 4.6 10*3/uL (ref 3.8–10.8)

## 2019-03-27 LAB — LIPID PANEL W/REFLEX DIRECT LDL
Cholesterol: 179 mg/dL (ref <200)
HDL: 79 mg/dL (ref >=50)
LDL Cholesterol (Calc): 80 mg/dL (calc)
Non-HDL Cholesterol (Calc): 100 mg/dL (calc) (ref <130)
Total CHOL/HDL Ratio: 2.3 (calc) (ref <5.0)
Triglycerides: 107 mg/dL (ref <150)

## 2019-03-27 LAB — INFLUENZA B ANTIBODY: INFLUENZA TYPE B ANTIBODY SERUM: 1:16 {titer} — ABNORMAL HIGH

## 2019-03-27 LAB — SAR COV2 SEROLOGY (COVID19)AB(IGG),IA: SARS CoV2 AB IGG: NEGATIVE

## 2019-03-27 LAB — INFLUENZA A ANTIBODY, IGG: INFLUENZA TYPE A ANTIBODY SERUM: 1:32 {titer} — ABNORMAL HIGH

## 2019-03-27 LAB — TSH: TSH: 1.43 mIU/L (ref 0.40–4.50)

## 2019-03-31 ENCOUNTER — Encounter: Payer: Self-pay | Admitting: Sports Medicine

## 2019-04-08 ENCOUNTER — Ambulatory Visit: Payer: Medicare Other

## 2019-04-10 ENCOUNTER — Ambulatory Visit (INDEPENDENT_AMBULATORY_CARE_PROVIDER_SITE_OTHER): Payer: Medicare Other | Admitting: Sports Medicine

## 2019-04-10 VITALS — BP 121/77 | HR 75 | Ht 62.0 in | Wt 162.0 lb

## 2019-04-10 DIAGNOSIS — R7309 Other abnormal glucose: Secondary | ICD-10-CM | POA: Diagnosis not present

## 2019-04-10 LAB — POCT GLYCOSYLATED HEMOGLOBIN (HGB A1C): Hemoglobin A1C: 5.2 % (ref 4.0–5.6)

## 2019-04-10 NOTE — Progress Notes (Signed)
Pt presents to office for POCT Hgb A1C check. She has an elevated fasting glucose on 5/11/  Last A1C check was 01/14/18 and it was 5.1  Today's check was 5.2  Patient and provider made aware of results at visit.

## 2019-04-29 ENCOUNTER — Telehealth: Payer: Self-pay

## 2019-04-29 NOTE — Telephone Encounter (Signed)
Lavita called and states her daughter was exposed to COVID-19 and was just tested today. She states she was in close contact with her daughter on Sunday. Sunday night Yarely started having congestion, cough, headache and nausea. She was wanting to get tested for COVID-19.

## 2019-04-29 NOTE — Telephone Encounter (Signed)
In her case because of the exposure she needs to self quarantine for 14 days either way, the test will not change the management of the situation.

## 2019-04-30 NOTE — Telephone Encounter (Signed)
Notified patient of Dr Mcneil Sober suggestion, and explained that if sx persist and needs medical attention to call office, go to [facility] or ED.

## 2019-05-10 ENCOUNTER — Other Ambulatory Visit: Payer: Self-pay | Admitting: Sports Medicine

## 2019-05-21 DIAGNOSIS — L814 Other melanin hyperpigmentation: Secondary | ICD-10-CM | POA: Diagnosis not present

## 2019-05-21 DIAGNOSIS — D1801 Hemangioma of skin and subcutaneous tissue: Secondary | ICD-10-CM | POA: Diagnosis not present

## 2019-05-21 DIAGNOSIS — L821 Other seborrheic keratosis: Secondary | ICD-10-CM | POA: Diagnosis not present

## 2019-05-21 DIAGNOSIS — L739 Follicular disorder, unspecified: Secondary | ICD-10-CM | POA: Diagnosis not present

## 2019-06-26 ENCOUNTER — Encounter: Payer: Self-pay | Admitting: Sports Medicine

## 2019-06-26 ENCOUNTER — Ambulatory Visit (INDEPENDENT_AMBULATORY_CARE_PROVIDER_SITE_OTHER): Payer: Medicare Other | Admitting: Sports Medicine

## 2019-06-26 ENCOUNTER — Other Ambulatory Visit: Payer: Self-pay

## 2019-06-26 VITALS — BP 116/72 | HR 73 | Ht 62.0 in | Wt 157.0 lb

## 2019-06-26 DIAGNOSIS — Z01818 Encounter for other preprocedural examination: Secondary | ICD-10-CM | POA: Diagnosis not present

## 2019-06-26 DIAGNOSIS — D649 Anemia, unspecified: Secondary | ICD-10-CM | POA: Diagnosis not present

## 2019-06-26 DIAGNOSIS — Z9889 Other specified postprocedural states: Secondary | ICD-10-CM

## 2019-06-26 NOTE — Progress Notes (Signed)
Subjective:    CC: Surgical clearance  HPI: This is a pleasant 63 year old female, she has a history of a lower blepharoplasty, that may have been a bit overaggressive.  She has difficulty closing her eyes.  She has made an appointment with another plastic surgeon for consideration of revision lower blepharoplasty.  She does need surgical clearance.  She has greater than 4 metabolic movements of cardiac capacity, she had a cardiac catheterization last year, coronaries are clear.  She does need some labs done.  I reviewed the past medical history, family history, social history, surgical history, and allergies today and no changes were needed.  Please see the problem list section below in epic for further details.  Past Medical History: Past Medical History:  Diagnosis Date  . Allergy   . Allergy-induced asthma   . Arthritis   . Hyperlipidemia   . Hypertension   . Irregular heart rhythm   . RLS (restless legs syndrome)    Past Surgical History: Past Surgical History:  Procedure Laterality Date  . ABDOMINAL HYSTERECTOMY  2003  . APPENDECTOMY  1982  . CHOLECYSTECTOMY  1982  . INCONTINENCE SURGERY  2003  . [place]  . TUBAL LIGATION  1992   Social History: Social History   Socioeconomic History  . Marital status: Married    Spouse name: Not on file  . Number of children: Not on file  . Years of education: Not on file  . Highest education level: Not on file  Occupational History  . Not on file  Social Needs  . Financial resource strain: Not on file  . Food insecurity    Worry: Not on file    Inability: Not on file  . Transportation needs    Medical: Not on file    Non-medical: Not on file  Tobacco Use  . Smoking status: Former Smoker    Quit date: 11/13/1972    Years since quitting: 46.6  . Smokeless tobacco: Never Used  Substance and Sexual Activity  . Alcohol use: Yes    Alcohol/week: 1.0 standard drinks    Types: 1 Glasses of wine  per week  . Drug use: No  . Sexual activity: Not Currently    Birth control/protection: Surgical  Lifestyle  . Physical activity    Days per week: Not on file    Minutes per session: Not on file  . Stress: Not on file  Relationships  . Social Herbalist on phone: Not on file    Gets together: Not on file    Attends religious service: Not on file    Active member of club or organization: Not on file    Attends meetings of clubs or organizations: Not on file    Relationship status: Not on file  Other Topics Concern  . Not on file  Social History Narrative  . Not on file   Family History: Family History  Problem Relation Age of Onset  . Cancer Mother        lung  . Hypertension Father   . Heart attack Father   . Prostate cancer Maternal Uncle   . Colon cancer Neg Hx   . Esophageal cancer Neg Hx   . Rectal cancer Neg Hx   . Stomach cancer Neg Hx    Allergies: Allergies  Allergen Reactions  . Amoxicillin Anaphylaxis  . Penicillins Anaphylaxis  . Pentazocine-Naloxone Anxiety and Other (See Comments)    insomnia insomnia   .  Poison Ivy Extract [Poison Ivy Extract] Rash   Medications: See med rec.  Review of Systems: No fevers, chills, night sweats, weight loss, chest pain, or shortness of breath.   Objective:    General: Well Developed, well nourished, and in no acute distress.  Neuro: Alert and oriented x3, extra-ocular muscles intact, sensation grossly intact.  HEENT: Normocephalic, atraumatic, pupils equal round reactive to light, neck supple, no masses, no lymphadenopathy, thyroid nonpalpable.  Skin: Warm and dry, no rashes. Cardiac: Regular rate and rhythm, no murmurs rubs or gallops, no lower extremity edema.  Respiratory: Clear to auscultation bilaterally. Not using accessory muscles, speaking in full sentences.  Twelve-lead ECG personally reviewed, normal rate, normal rhythm, normal axis, she has T wave inversions in V2 and V3 which are unchanged  from a prior ECG.  Impression and Recommendations:    History of blepharoplasty Status post lower blepharoplasty, with a revision coming up. This is intermediate risk noncardiac surgery, she has greater than 4 metabolic equivalents of cardiac capacity. She had a cardiac catheterization last year. ECG is normal and unchanged from prior. Ordering requested labs, CBC and CMP. She is cleared for surgery.   ___________________________________________ Gwen Her. Dianah Field, M.D., ABFM., CAQSM. Primary Care and Sports Medicine [facility]  Adjunct Professor of [employer] [place] [employer]

## 2019-06-26 NOTE — Assessment & Plan Note (Signed)
Status post lower blepharoplasty, with a revision coming up. This is intermediate risk noncardiac surgery, she has greater than 4 metabolic equivalents of cardiac capacity. She had a cardiac catheterization last year. ECG is normal and unchanged from prior. Ordering requested labs, CBC and CMP. She is cleared for surgery.

## 2019-06-27 LAB — COMPLETE METABOLIC PANEL WITH GFR
AG Ratio: 1.9 (calc) (ref 1.0–2.5)
ALT: 32 U/L — ABNORMAL HIGH (ref 6–29)
AST: 25 U/L (ref 10–35)
Albumin: 4.6 g/dL (ref 3.6–5.1)
Alkaline phosphatase (APISO): 57 U/L (ref 37–153)
BUN: 16 mg/dL (ref 7–25)
CO2: 27 mmol/L (ref 20–32)
Calcium: 9.8 mg/dL (ref 8.6–10.4)
Chloride: 101 mmol/L (ref 98–110)
Creat: 0.89 mg/dL (ref 0.50–0.99)
GFR, Est African American: 79 mL/min/{1.73_m2} (ref >=60)
GFR, Est Non African American: 68 mL/min/{1.73_m2} (ref >=60)
Globulin: 2.4 g/dL (calc) (ref 1.9–3.7)
Glucose, Bld: 90 mg/dL (ref 65–99)
Potassium: 4.1 mmol/L (ref 3.5–5.3)
Sodium: 137 mmol/L (ref 135–146)
Total Bilirubin: 0.6 mg/dL (ref 0.2–1.2)
Total Protein: 7 g/dL (ref 6.1–8.1)

## 2019-06-27 LAB — CBC
HCT: 47 % — ABNORMAL HIGH (ref 35.0–45.0)
Hemoglobin: 15.7 g/dL — ABNORMAL HIGH (ref 11.7–15.5)
MCH: 29.8 pg (ref 27.0–33.0)
MCHC: 33.4 g/dL (ref 32.0–36.0)
MCV: 89.2 fL (ref 80.0–100.0)
MPV: 10.1 fL (ref 7.5–12.5)
Platelets: 261 10*3/uL (ref 140–400)
RBC: 5.27 10*6/uL — ABNORMAL HIGH (ref 3.80–5.10)
RDW: 12.3 % (ref 11.0–15.0)
WBC: 5 10*3/uL (ref 3.8–10.8)

## 2019-07-17 ENCOUNTER — Ambulatory Visit (INDEPENDENT_AMBULATORY_CARE_PROVIDER_SITE_OTHER): Payer: Medicare Other | Admitting: Physician Assistant

## 2019-07-17 ENCOUNTER — Other Ambulatory Visit: Payer: Self-pay

## 2019-07-17 ENCOUNTER — Encounter: Payer: Self-pay | Admitting: Physician Assistant

## 2019-07-17 VITALS — Temp 97.5°F | Wt 156.0 lb

## 2019-07-17 DIAGNOSIS — B9689 Other specified bacterial agents as the cause of diseases classified elsewhere: Secondary | ICD-10-CM

## 2019-07-17 DIAGNOSIS — J3489 Other specified disorders of nose and nasal sinuses: Secondary | ICD-10-CM

## 2019-07-17 DIAGNOSIS — J019 Acute sinusitis, unspecified: Secondary | ICD-10-CM | POA: Diagnosis not present

## 2019-07-17 MED ORDER — DOXYCYCLINE HYCLATE 100 MG PO TABS: 100.0000 mg | ORAL_TABLET | Freq: Two times a day (BID) | ORAL | 0 refills | Status: AC

## 2019-07-17 NOTE — Progress Notes (Addendum)
Virtual Visit via Video Note  I connected with Heidi Mclaughlin on 07/17/19 at 10:10 AM EDT by a video enabled telemedicine application and verified that I am speaking with the correct person using two identifiers.   I discussed the limitations of evaluation and management by telemedicine and the availability of in person appointments. The patient expressed understanding and agreed to proceed.  History of Present Illness: HPI:                                                                Heidi Mclaughlin is a 63 y.o. female   CC: sinus pressure, cough, ear fullness  Onset: 3 days ago States she is having gradually worsening sinus pressure that feels typical of a sinus infection for her Reports she has a mostly dry cough, which is not new and typical of her allergies She does have new onset nausea, which she states she gets with sinus infections Denies ear pain, otorrhea or hearing loss Denies fever, chills, myalgia or flu-like symptoms Denies dyspnea or chest pain Denies loss of taste/smell Denies abdominal pain/vomiting/diarrhea Denies known sick contacts or exposure to COVID-19 She reports she went to [facility] over 2 weeks ago Patient has a history of allergic rhinitis and takes Singulair, Zyrtec, Mucinex, and Flonase Reports her allergies are typically more severe in August/September Treatments tried: "eating spicy food to help drain sinuses"  Past Medical History:  Diagnosis Date  . Allergy   . Allergy-induced asthma   . Arthritis   . Hyperlipidemia   . Hypertension   . Irregular heart rhythm   . RLS (restless legs syndrome)    Past Surgical History:  Procedure Laterality Date  . ABDOMINAL HYSTERECTOMY  2003  . APPENDECTOMY  1982  . CHOLECYSTECTOMY  1982  . INCONTINENCE SURGERY  2003  . [place]  . TUBAL LIGATION  1992   Social History   Tobacco Use  . Smoking status: Former Smoker    Quit date: 11/13/1972    Years since  quitting: 46.7  . Smokeless tobacco: Never Used  Substance Use Topics  . Alcohol use: Yes    Alcohol/week: 1.0 standard drinks    Types: 1 Glasses of wine per week   family history includes Cancer in her mother; Heart attack in her father; Hypertension in her father; Prostate cancer in her maternal uncle.    ROS: negative except as noted in the HPI  Medications: Current Outpatient Medications  Medication Sig Dispense Refill  . cetirizine (ZYRTEC) 10 MG tablet Take 10 mg by mouth.    . fluticasone (FLONASE) 50 MCG/ACT nasal spray USE 1 SPRAY INTO EACH NOSTRIL TWICE A DAY 48 g 0  . folic acid (FOLVITE) 1 MG tablet TAKE 1 TABLET (1 MG TOTAL) BY MOUTH DAILY. 90 tablet 2  . hydrochlorothiazide (HYDRODIURIL) 25 MG tablet TAKE 1 TABLET (25 MG TOTAL) BY MOUTH DAILY. 90 tablet 3  . montelukast (SINGULAIR) 10 MG tablet TAKE 1 TABLET BY MOUTH EVERYDAY AT BEDTIME 90 tablet 1  . rOPINIRole (REQUIP) 1 MG tablet Take 1 tablet (1 mg total) by mouth at bedtime. 90 tablet 1  . simvastatin (ZOCOR) 40 MG tablet TAKE 1 TABLET (40 MG TOTAL) BY MOUTH DAILY. 90 tablet 3  . valACYclovir (VALTREX)  500 MG tablet TAKE 1 TABLET (500 MG TOTAL) BY MOUTH AS NEEDED. 90 tablet 0   No current facility-administered medications for this visit.    Allergies  Allergen Reactions  . Amoxicillin Anaphylaxis  . Penicillins Anaphylaxis  . Pentazocine-Naloxone Anxiety and Other (See Comments)    insomnia insomnia   . Poison Ivy Extract [Poison Ivy Extract] Rash       Objective:  Temp (!) 97.5 F (36.4 C) (Oral)   Wt 156 lb (70.8 kg)   BMI 28.53 kg/m  Gen:  alert, not ill-appearing, seated upright in her car, no distress, appropriate for age HEENT: head normocephalic without obvious abnormality Pulm: Normal work of breathing, normal phonation Neuro: alert and oriented x 3 Psych: cooperative, speech is articulate, normal rate and volume; thought processes clear and goal-directed, normal judgment, good  insight   No results found for this or any previous visit (from the past 72 hour(s)). No results found.    Assessment and Plan: 63 y.o. female with   .Heidi Mclaughlin was seen today for sinusitis, ear fullness and cough.  Diagnoses and all orders for this visit:  Sinus pressure  Acute bacterial sinusitis -     doxycycline (VIBRA-TABS) 100 MG tablet; Take 1 tablet (100 mg total) by mouth 2 (two) times daily for 7 days.  Patient is afebrile by home temp. Physical exam limited by video visit. She is not ill appearing and in no respiratory distress Patient with history of perennial rhinitis and recurrent sinusitis with 3 days of gradually worsening sinus pressure that is not improving with her usual rhinitis medicaitons History is most consistent with bacterial sinusitis Low clinical suspicion for COVID-19 based on symptoms, however I have instructed her to self-monitor for worsening cough, SOB, fever, flu-like symptoms, sudden loss of taste/smell and to isolate at home and contact our office if she experiences these symptoms Start Doxycycline 100 mg bid with food x 7 days for suspected bacterial sinusitis (PCN allergy)     Follow Up Instructions:    I discussed the assessment and treatment plan with the patient. The patient was provided an opportunity to ask questions and all were answered. The patient agreed with the plan and demonstrated an understanding of the instructions.   The patient was advised to call back or seek an in-person evaluation if the symptoms worsen or if the condition fails to improve as anticipated.  I provided 10 minutes of non-face-to-face time during this encounter.   Trixie Dredge, [place]

## 2019-07-23 DIAGNOSIS — Z20828 Contact with and (suspected) exposure to other viral communicable diseases: Secondary | ICD-10-CM | POA: Diagnosis not present

## 2019-07-23 DIAGNOSIS — L988 Other specified disorders of the skin and subcutaneous tissue: Secondary | ICD-10-CM | POA: Diagnosis not present

## 2019-07-23 DIAGNOSIS — Z01812 Encounter for preprocedural laboratory examination: Secondary | ICD-10-CM | POA: Diagnosis not present

## 2019-07-23 DIAGNOSIS — H02834 Dermatochalasis of left upper eyelid: Secondary | ICD-10-CM | POA: Diagnosis not present

## 2019-07-23 DIAGNOSIS — H02831 Dermatochalasis of right upper eyelid: Secondary | ICD-10-CM | POA: Diagnosis not present

## 2019-07-23 DIAGNOSIS — H57813 Brow ptosis, bilateral: Secondary | ICD-10-CM | POA: Diagnosis not present

## 2019-08-12 ENCOUNTER — Other Ambulatory Visit: Payer: Self-pay | Admitting: Sports Medicine

## 2019-09-12 ENCOUNTER — Encounter: Payer: Self-pay | Admitting: Sports Medicine

## 2019-09-18 ENCOUNTER — Other Ambulatory Visit: Payer: Self-pay | Admitting: Sports Medicine

## 2019-09-18 ENCOUNTER — Other Ambulatory Visit: Payer: Self-pay

## 2019-09-18 ENCOUNTER — Ambulatory Visit (INDEPENDENT_AMBULATORY_CARE_PROVIDER_SITE_OTHER): Payer: Medicare Other | Admitting: Sports Medicine

## 2019-09-18 DIAGNOSIS — Z23 Encounter for immunization: Secondary | ICD-10-CM

## 2019-09-18 NOTE — Progress Notes (Signed)
Patient presents to clinic for flu vaccine via drive by. Patient tolerated vaccine well in left deltoid with no immediate complications. Patient denies any negative side effects in the past, no fever in the last 48 hours and denies an allergy to latex or eggs.

## 2019-09-24 DIAGNOSIS — Z803 Family history of malignant neoplasm of breast: Secondary | ICD-10-CM | POA: Diagnosis not present

## 2019-09-24 DIAGNOSIS — Z1231 Encounter for screening mammogram for malignant neoplasm of breast: Secondary | ICD-10-CM | POA: Diagnosis not present

## 2019-09-24 LAB — HM MAMMOGRAPHY

## 2019-09-26 ENCOUNTER — Ambulatory Visit: Payer: Medicare Other | Admitting: Osteopathic Medicine

## 2019-09-27 DIAGNOSIS — Z20828 Contact with and (suspected) exposure to other viral communicable diseases: Secondary | ICD-10-CM | POA: Diagnosis not present

## 2019-09-30 ENCOUNTER — Ambulatory Visit (INDEPENDENT_AMBULATORY_CARE_PROVIDER_SITE_OTHER): Payer: Medicare Other | Admitting: Physician Assistant

## 2019-09-30 ENCOUNTER — Encounter: Payer: Self-pay | Admitting: Physician Assistant

## 2019-09-30 VITALS — Temp 98.0°F | Ht 62.0 in | Wt 159.0 lb

## 2019-09-30 DIAGNOSIS — J40 Bronchitis, not specified as acute or chronic: Secondary | ICD-10-CM | POA: Diagnosis not present

## 2019-09-30 DIAGNOSIS — J329 Chronic sinusitis, unspecified: Secondary | ICD-10-CM

## 2019-09-30 MED ORDER — ALBUTEROL SULFATE HFA 108 (90 BASE) MCG/ACT IN AERS: 2.0000 | INHALATION_SPRAY | Freq: Four times a day (QID) | RESPIRATORY_TRACT | 0 refills | Status: DC | PRN

## 2019-09-30 MED ORDER — AZITHROMYCIN 250 MG PO TABS: ORAL_TABLET | ORAL | 0 refills | Status: DC

## 2019-09-30 MED ORDER — PREDNISONE 50 MG PO TABS: 50.0000 mg | ORAL_TABLET | Freq: Every day | ORAL | 0 refills | Status: DC

## 2019-09-30 NOTE — Progress Notes (Signed)
Patient ID: Heidi Mclaughlin, female   DOB: [birth year], 63 y.o.   MRN: [id number] .Marland KitchenVirtual Visit via Video Note  I connected with Heidi Mclaughlin on 09/30/19 at 11:30 AM EST by a video enabled telemedicine application and verified that I am speaking with the correct person using two identifiers.  Location: Patient: home Provider: clinic   I discussed the limitations of evaluation and management by telemedicine and the availability of in person appointments. The patient expressed understanding and agreed to proceed.  History of Present Illness: Pt is a 63 yo female with HTN, PVC who calls into the clinic with cough for the last 1.5 weeks that is productive. She has also lost her voice, itchy/watery eyes, runny nose. She had a negative covid test. She denies any headaches, fever, body aches, weakness,  nausea, vomiting, diarrhea, loss of smell or taste. She does feel SOB and "like an elephant is sitting on her chest". She has some sinus pressure and congestion. She does have seasonal allergies and has gotten bronchitis a few times in the past similar to this.    .. Active Ambulatory Problems    Diagnosis Date Noted  . Seasonal allergies 04/24/2013  . Hyperlipidemia 04/24/2013  . Restless legs 04/24/2013  . Knee osteoarthritis 04/24/2013  . Annual physical exam 04/24/2013  . Spondylolisthesis of lumbar region 08/07/2013  . Benign essential hypertension 12/29/2013  . Trigger finger, acquired 11/17/2014  . Hemorrhoids, external 12/07/2015  . Right ankle pain 08/30/2017  . Acute pain of right shoulder 10/26/2017  . PVC's (premature ventricular contractions) 08/07/2017  . Persistent insomnia 08/26/2014  . History of blepharoplasty 04/22/2013  . Diverticulosis 08/26/2014  . Exposure to 2019 novel coronavirus 03/20/2019   Resolved Ambulatory Problems    Diagnosis Date Noted  . Acute sinusitis 07/29/2013  . Trochanteric bursitis of left hip 01/13/2014  . Acute maxillary sinusitis 03/16/2014   . Leg swelling 08/07/2014  . Urinary frequency 11/03/2014  . Acute maxillary sinusitis 05/04/2015  . Tendinitis of left extensor carpi ulnaris tendon 08/17/2015  . Upper respiratory infection, viral 10/04/2015  . Viral URI 01/06/2016  . Corn of foot 02/01/2016  . Tinea corporis 02/03/2016  . Acute non-recurrent maxillary sinusitis 05/01/2016  . Poison ivy dermatitis 07/10/2016  . Chest pain 08/17/2016  . Watering of right eye 09/24/2017  . Acute bronchitis 07/10/2018   Past Medical History:  Diagnosis Date  . Allergy   . Allergy-induced asthma   . Arthritis   . Hypertension   . Irregular heart rhythm   . RLS (restless legs syndrome)    Reviewed med, allergies, problem list.    Observations/Objective: No acute distress.  Normal appearance and mood.  Productive cough on exam.  No labored breathing.   .. Today's Vitals   09/30/19 1114  Temp: 98 F (36.7 C)  TempSrc: Oral  Weight: 159 lb (72.1 kg)  Height: 5\' 2"  (1.575 m)   Body mass index is 29.08 kg/m.     Assessment and Plan: Marland KitchenMarland KitchenDiagnoses and all orders for this visit:  Sinobronchitis -     albuterol (VENTOLIN HFA) 108 (90 Base) MCG/ACT inhaler; Inhale 2 puffs into the lungs every 6 (six) hours as needed for wheezing or shortness of breath. -     predniSONE (DELTASONE) 50 MG tablet; Take 1 tablet (50 mg total) by mouth daily. -     azithromycin (ZITHROMAX) 250 MG tablet; Take 2 tablets now and then take 1 tablet for 4 days.   Negative COVID test. Start zpak, prednisone,  albuterol. Rest and hydrate. Follow up as needed or if symptoms worsen. Consider adding flonase for nasal congestion.    Follow Up Instructions:    I discussed the assessment and treatment plan with the patient. The patient was provided an opportunity to ask questions and all were answered. The patient agreed with the plan and demonstrated an understanding of the instructions.   The patient was advised to call back or seek an in-person  evaluation if the symptoms worsen or if the condition fails to improve as anticipated.   Iran Planas, PA-C

## 2019-09-30 NOTE — Progress Notes (Deleted)
Going on for 1.5 week Productive cough, "settled in chest" Laryngitis Negative Covid test, worried about bronchitis Has seasonal allergies - runny nose, eyes  No headaches, no nausea/vomiting/GI symptoms

## 2019-10-02 ENCOUNTER — Encounter: Payer: Self-pay | Admitting: Sports Medicine

## 2019-10-06 ENCOUNTER — Encounter: Payer: Self-pay | Admitting: Sports Medicine

## 2019-10-15 ENCOUNTER — Ambulatory Visit (INDEPENDENT_AMBULATORY_CARE_PROVIDER_SITE_OTHER): Payer: Medicare Other

## 2019-10-15 ENCOUNTER — Other Ambulatory Visit: Payer: Self-pay

## 2019-10-15 ENCOUNTER — Ambulatory Visit (INDEPENDENT_AMBULATORY_CARE_PROVIDER_SITE_OTHER): Payer: Medicare Other | Admitting: Sports Medicine

## 2019-10-15 VITALS — BP 118/70 | HR 69 | Temp 97.8°F | Ht 62.0 in | Wt 158.0 lb

## 2019-10-15 DIAGNOSIS — Z13820 Encounter for screening for osteoporosis: Secondary | ICD-10-CM | POA: Diagnosis not present

## 2019-10-15 DIAGNOSIS — M818 Other osteoporosis without current pathological fracture: Secondary | ICD-10-CM | POA: Diagnosis not present

## 2019-10-15 DIAGNOSIS — J40 Bronchitis, not specified as acute or chronic: Secondary | ICD-10-CM | POA: Diagnosis not present

## 2019-10-15 DIAGNOSIS — R05 Cough: Secondary | ICD-10-CM | POA: Diagnosis not present

## 2019-10-15 DIAGNOSIS — Z23 Encounter for immunization: Secondary | ICD-10-CM

## 2019-10-15 DIAGNOSIS — Z0000 Encounter for general adult medical examination without abnormal findings: Secondary | ICD-10-CM | POA: Diagnosis not present

## 2019-10-15 NOTE — Assessment & Plan Note (Signed)
Due for pneumonia 23 vaccine and DEXA scan.

## 2019-10-15 NOTE — Assessment & Plan Note (Addendum)
Present now for 3 weeks, mild cough, much better with albuterol. She has already had albuterol and prednisone. Clinically stable, lungs are clear, adding a chest x-ray, continue her albuterol. I think we simply watch this for now. She does have a wedding coming up middle of January 2021, I am happy to give her a burst of prednisone should she have any symptoms then but at that point we would likely be testing her for asthma/COPD. Ultimately I think she is simply in the tail end of postinfectious cough syndrome.

## 2019-10-15 NOTE — Progress Notes (Signed)
Subjective:    CC: Coughing  HPI: For 3 weeks now this pleasant 63 year old female has had a mild cough, improving considerably, she did have a course of azithromycin, prednisone and improved considerably.  Cough is minimally productive but mostly nonproductive, no GI symptoms, no constitutional symptoms, no facial pain or pressure, no shortness of breath.  I reviewed the past medical history, family history, social history, surgical history, and allergies today and no changes were needed.  Please see the problem list section below in epic for further details.  Past Medical History: Past Medical History:  Diagnosis Date  . Allergy   . Allergy-induced asthma   . Arthritis   . Hyperlipidemia   . Hypertension   . Irregular heart rhythm   . RLS (restless legs syndrome)    Past Surgical History: Past Surgical History:  Procedure Laterality Date  . ABDOMINAL HYSTERECTOMY  2003  . APPENDECTOMY  1982  . CHOLECYSTECTOMY  1982  . INCONTINENCE SURGERY  2003  . [place]  . TUBAL LIGATION  1992   Social History: Social History   Socioeconomic History  . Marital status: Married    Spouse name: Not on file  . Number of children: Not on file  . Years of education: Not on file  . Highest education level: Not on file  Occupational History  . Not on file  Social Needs  . Financial resource strain: Not on file  . Food insecurity    Worry: Not on file    Inability: Not on file  . Transportation needs    Medical: Not on file    Non-medical: Not on file  Tobacco Use  . Smoking status: Former Smoker    Quit date: 11/13/1972    Years since quitting: 46.9  . Smokeless tobacco: Never Used  Substance and Sexual Activity  . Alcohol use: Yes    Alcohol/week: 1.0 standard drinks    Types: 1 Glasses of wine per week  . Drug use: No  . Sexual activity: Not Currently    Birth control/protection: Surgical  Lifestyle  . Physical activity    Days per week: Not  on file    Minutes per session: Not on file  . Stress: Not on file  Relationships  . Social Herbalist on phone: Not on file    Gets together: Not on file    Attends religious service: Not on file    Active member of club or organization: Not on file    Attends meetings of clubs or organizations: Not on file    Relationship status: Not on file  Other Topics Concern  . Not on file  Social History Narrative  . Not on file   Family History: Family History  Problem Relation Age of Onset  . Cancer Mother        lung  . Hypertension Father   . Heart attack Father   . Prostate cancer Maternal Uncle   . Colon cancer Neg Hx   . Esophageal cancer Neg Hx   . Rectal cancer Neg Hx   . Stomach cancer Neg Hx    Allergies: Allergies  Allergen Reactions  . Amoxicillin Anaphylaxis  . Penicillins Anaphylaxis  . Pentazocine-Naloxone Anxiety and Other (See Comments)    insomnia insomnia   . Poison Ivy Extract [Poison Ivy Extract] Rash   Medications: See med rec.  Review of Systems: No fevers, chills, night sweats, weight loss, chest pain, or shortness of breath.  Objective:    General: Well Developed, well nourished, and in no acute distress.  Neuro: Alert and oriented x3, extra-ocular muscles intact, sensation grossly intact.  HEENT: Normocephalic, atraumatic, pupils equal round reactive to light, neck supple, no masses, no lymphadenopathy, thyroid nonpalpable.  Oropharynx, nasopharynx, ear canals unremarkable. Skin: Warm and dry, no rashes. Cardiac: Regular rate and rhythm, no murmurs rubs or gallops, no lower extremity edema.  Respiratory: Clear to auscultation bilaterally. Not using accessory muscles, speaking in full sentences.  Impression and Recommendations:    Bronchitis Present now for 3 weeks, mild cough, much better with albuterol. She has already had albuterol and prednisone. Clinically stable, lungs are clear, adding a chest x-ray, continue her albuterol.  I think we simply watch this for now. She does have a wedding coming up middle of January 2021, I am happy to give her a burst of prednisone should she have any symptoms then but at that point we would likely be testing her for asthma/COPD. Ultimately I think she is simply in the tail end of postinfectious cough syndrome.  Annual physical exam Due for pneumonia 23 vaccine and DEXA scan.   ___________________________________________ Gwen Her. Dianah Field, M.D., ABFM., CAQSM. Primary Care and Sports Medicine [facility]  Adjunct Professor of [place] of [facility] of Medicine

## 2019-10-23 ENCOUNTER — Encounter: Payer: Self-pay | Admitting: Sports Medicine

## 2019-10-23 ENCOUNTER — Other Ambulatory Visit: Payer: Self-pay | Admitting: *Deleted

## 2019-10-23 ENCOUNTER — Other Ambulatory Visit: Payer: Self-pay | Admitting: Physician Assistant

## 2019-10-23 DIAGNOSIS — J329 Chronic sinusitis, unspecified: Secondary | ICD-10-CM

## 2019-10-23 DIAGNOSIS — J40 Bronchitis, not specified as acute or chronic: Secondary | ICD-10-CM

## 2019-10-23 MED ORDER — MONTELUKAST SODIUM 10 MG PO TABS: ORAL_TABLET | ORAL | 1 refills | Status: DC

## 2019-11-03 DIAGNOSIS — Z20828 Contact with and (suspected) exposure to other viral communicable diseases: Secondary | ICD-10-CM | POA: Diagnosis not present

## 2019-11-14 HISTORY — PX: EYELID LACERATION REPAIR: SHX1564

## 2019-11-18 DIAGNOSIS — Z20822 Contact with and (suspected) exposure to covid-19: Secondary | ICD-10-CM | POA: Diagnosis not present

## 2019-11-19 ENCOUNTER — Encounter: Payer: Self-pay | Admitting: Sports Medicine

## 2019-11-19 ENCOUNTER — Ambulatory Visit (INDEPENDENT_AMBULATORY_CARE_PROVIDER_SITE_OTHER): Payer: Medicare Other | Admitting: Sports Medicine

## 2019-11-19 ENCOUNTER — Other Ambulatory Visit: Payer: Self-pay

## 2019-11-19 DIAGNOSIS — R05 Cough: Secondary | ICD-10-CM | POA: Diagnosis not present

## 2019-11-19 DIAGNOSIS — R053 Chronic cough: Secondary | ICD-10-CM

## 2019-11-19 NOTE — Assessment & Plan Note (Addendum)
This pleasant 64 year old female has had a cough now since March, not changed with the weather, not worse at night, we have tried several courses of steroids and antibiotics, imaging has always been clear. Overall she is doing okay but the cough is persistent and mild. She did have a recent COVID-19 test that was negative. Exam is okay with the exception of slight coarse sounds in the right lower lobe. Overall her symptoms are not at goal. At this point I think we should proceed with pre and postbronchodilator spirometry. If the spirometry testing is unremarkable we will consider postnasal drip syndrome as the ultimate diagnosis.

## 2019-11-19 NOTE — Progress Notes (Signed)
    Procedures performed today:    None.  Independent interpretation of tests performed by another provider:   None.  Impression and Recommendations:    Chronic cough This pleasant 64 year old female has had a cough now since March, not changed with the weather, not worse at night, we have tried several courses of steroids and antibiotics, imaging has always been clear. Overall she is doing okay but the cough is persistent and mild. She did have a recent COVID-19 test that was negative. Exam is okay with the exception of slight coarse sounds in the right lower lobe. Overall her symptoms are not at goal. At this point I think we should proceed with pre and postbronchodilator spirometry. If the spirometry testing is unremarkable we will consider postnasal drip syndrome as the ultimate diagnosis.    ___________________________________________ Gwen Her. Dianah Field, M.D., ABFM., CAQSM. Primary Care and [place] Instructor of [place] of [facility] of Medicine

## 2019-11-24 ENCOUNTER — Encounter: Payer: Self-pay | Admitting: Sports Medicine

## 2019-11-24 ENCOUNTER — Ambulatory Visit (INDEPENDENT_AMBULATORY_CARE_PROVIDER_SITE_OTHER): Payer: Medicare Other | Admitting: Sports Medicine

## 2019-11-24 ENCOUNTER — Ambulatory Visit: Payer: Medicare Other | Admitting: Sports Medicine

## 2019-11-24 ENCOUNTER — Other Ambulatory Visit: Payer: Self-pay

## 2019-11-24 VITALS — BP 134/87 | HR 69 | Ht 62.0 in | Wt 159.0 lb

## 2019-11-24 DIAGNOSIS — R053 Chronic cough: Secondary | ICD-10-CM

## 2019-11-24 DIAGNOSIS — R05 Cough: Secondary | ICD-10-CM

## 2019-11-24 MED ORDER — MOMETASONE FUROATE 50 MCG/ACT NA SUSP: NASAL | 6 refills | Status: DC

## 2019-11-24 MED ORDER — ALBUTEROL SULFATE (2.5 MG/3ML) 0.083% IN NEBU
2.5000 mg | INHALATION_SOLUTION | Freq: Once | RESPIRATORY_TRACT | Status: AC
  Administered 2019-11-24: 2.5 mg via RESPIRATORY_TRACT

## 2019-11-24 NOTE — Progress Notes (Signed)
    Procedures performed today:    Pre and postbronchodilator spirometry was reviewed by myself, normal flow volume loop pretest, slightly decreased FEV1 post test.  No improvement with bronchodilator.  Independent interpretation of tests performed by another provider:   None.  Impression and Recommendations:    Chronic cough This pleasant 64 year old female returns, she has had a cough since March, no real precipitating or palliating factors, treated with steroids and antibiotics multiple times, imaging has been clear. Negative COVID-19. Not on any ACE inhibitor's. We did perform pre and postbronchodilator spirometry today, pretest showed normal flow volume loop, post test did show decreased FEV, but this is likely due more to poor effort. Because of all the above we are going to consider this postnasal drip syndrome/upper airway cough syndrome, switching from Flonase to Nasonex.    ___________________________________________ Heidi Mclaughlin. Dianah Field, M.D., ABFM., CAQSM. Primary Care and [place] Instructor of [place] of [facility] of Medicine

## 2019-11-24 NOTE — Assessment & Plan Note (Signed)
This pleasant 64 year old female returns, she has had a cough since March, no real precipitating or palliating factors, treated with steroids and antibiotics multiple times, imaging has been clear. Negative COVID-19. Not on any ACE inhibitor's. We did perform pre and postbronchodilator spirometry today, pretest showed normal flow volume loop, post test did show decreased FEV, but this is likely due more to poor effort. Because of all the above we are going to consider this postnasal drip syndrome/upper airway cough syndrome, switching from Flonase to Nasonex.

## 2019-11-24 NOTE — Progress Notes (Signed)
Patient presents to clinic for a spirometry test to evaluate for a chronic cough per PCP. She was in the clinic and per PCP it was ok to preform the nebulizer inhalation. Patient tolerated the inhaled medication well and did not experiencing any negative side effects from the inhaled medication. Patient tolerated the pre and post spirometry well.   It was approved by PCP to go ahead with a nebulizer during covid and the room was closed down and cleaned with bleach wipes.

## 2019-11-28 DIAGNOSIS — Z03818 Encounter for observation for suspected exposure to other biological agents ruled out: Secondary | ICD-10-CM | POA: Diagnosis not present

## 2019-12-05 DIAGNOSIS — Z23 Encounter for immunization: Secondary | ICD-10-CM | POA: Diagnosis not present

## 2019-12-08 ENCOUNTER — Other Ambulatory Visit: Payer: Self-pay

## 2019-12-08 ENCOUNTER — Ambulatory Visit (INDEPENDENT_AMBULATORY_CARE_PROVIDER_SITE_OTHER): Payer: Medicare Other | Admitting: Sports Medicine

## 2019-12-08 DIAGNOSIS — R05 Cough: Secondary | ICD-10-CM

## 2019-12-08 DIAGNOSIS — R058 Other specified cough: Secondary | ICD-10-CM

## 2019-12-08 NOTE — Assessment & Plan Note (Signed)
This pleasant 64 year old female returns, she has had a cough since March, no precipitating or palliating factors. She has had multiple courses of steroids and antibiotics, imaging has always been clear. No paroxysmal nocturnal dyspnea, no orthopnea, no leg swelling. Negative COVID-19, not on ACE inhibitor's, at the last visit we did a pre and post bronchodilator spirometry test that showed a normal flow/volume loop, post test did show decreased FEV1 but this was likely more due to poor effort. We had suspected more postnasal drip syndrome/upper airway cough syndrome and switch to Nasonex. She reports about 50% improvement and feels as though she can live with it, her cough is always worst in the colder months.

## 2019-12-08 NOTE — Progress Notes (Signed)
    Procedures performed today:    None.  Independent interpretation of tests performed by another provider:   None.  Impression and Recommendations:    Upper airway cough syndrome This pleasant 64 year old female returns, she has had a cough since March, no precipitating or palliating factors. She has had multiple courses of steroids and antibiotics, imaging has always been clear. No paroxysmal nocturnal dyspnea, no orthopnea, no leg swelling. Negative COVID-19, not on ACE inhibitor's, at the last visit we did a pre and post bronchodilator spirometry test that showed a normal flow/volume loop, post test did show decreased FEV1 but this was likely more due to poor effort. We had suspected more postnasal drip syndrome/upper airway cough syndrome and switch to Nasonex. She reports about 50% improvement and feels as though she can live with it, her cough is always worst in the colder months.    ___________________________________________ Gwen Her. Dianah Field, M.D., ABFM., CAQSM. Primary Care and [place] Instructor of [place] of [facility] of Medicine

## 2019-12-10 ENCOUNTER — Other Ambulatory Visit: Payer: Medicare Other

## 2019-12-11 DIAGNOSIS — R053 Chronic cough: Secondary | ICD-10-CM

## 2019-12-11 DIAGNOSIS — R05 Cough: Secondary | ICD-10-CM

## 2019-12-11 MED ORDER — MOMETASONE FUROATE 50 MCG/ACT NA SUSP: NASAL | 6 refills | Status: DC

## 2019-12-17 ENCOUNTER — Other Ambulatory Visit: Payer: Self-pay

## 2019-12-17 ENCOUNTER — Ambulatory Visit (INDEPENDENT_AMBULATORY_CARE_PROVIDER_SITE_OTHER): Payer: Medicare Other

## 2019-12-17 DIAGNOSIS — Z780 Asymptomatic menopausal state: Secondary | ICD-10-CM | POA: Diagnosis not present

## 2019-12-17 DIAGNOSIS — M818 Other osteoporosis without current pathological fracture: Secondary | ICD-10-CM | POA: Diagnosis not present

## 2019-12-17 DIAGNOSIS — Z13820 Encounter for screening for osteoporosis: Secondary | ICD-10-CM

## 2019-12-17 DIAGNOSIS — M8589 Other specified disorders of bone density and structure, multiple sites: Secondary | ICD-10-CM | POA: Diagnosis not present

## 2019-12-18 DIAGNOSIS — H04123 Dry eye syndrome of bilateral lacrimal glands: Secondary | ICD-10-CM | POA: Diagnosis not present

## 2019-12-18 DIAGNOSIS — H0100A Unspecified blepharitis right eye, upper and lower eyelids: Secondary | ICD-10-CM | POA: Diagnosis not present

## 2019-12-18 DIAGNOSIS — H0100B Unspecified blepharitis left eye, upper and lower eyelids: Secondary | ICD-10-CM | POA: Diagnosis not present

## 2019-12-25 ENCOUNTER — Ambulatory Visit: Payer: Medicare Other

## 2019-12-26 DIAGNOSIS — Z23 Encounter for immunization: Secondary | ICD-10-CM | POA: Diagnosis not present

## 2020-01-23 DIAGNOSIS — Z9289 Personal history of other medical treatment: Secondary | ICD-10-CM | POA: Diagnosis not present

## 2020-01-23 DIAGNOSIS — I781 Nevus, non-neoplastic: Secondary | ICD-10-CM | POA: Diagnosis not present

## 2020-01-27 ENCOUNTER — Other Ambulatory Visit: Payer: Self-pay | Admitting: Sports Medicine

## 2020-02-09 ENCOUNTER — Telehealth (INDEPENDENT_AMBULATORY_CARE_PROVIDER_SITE_OTHER): Payer: Medicare Other | Admitting: Sports Medicine

## 2020-02-09 ENCOUNTER — Encounter: Payer: Self-pay | Admitting: Sports Medicine

## 2020-02-09 DIAGNOSIS — J0141 Acute recurrent pansinusitis: Secondary | ICD-10-CM

## 2020-02-09 DIAGNOSIS — J324 Chronic pansinusitis: Secondary | ICD-10-CM | POA: Insufficient documentation

## 2020-02-09 MED ORDER — PREDNISONE 50 MG PO TABS: 50.0000 mg | ORAL_TABLET | Freq: Every day | ORAL | 0 refills | Status: DC

## 2020-02-09 MED ORDER — AZITHROMYCIN 250 MG PO TABS: ORAL_TABLET | ORAL | 0 refills | Status: DC

## 2020-02-09 NOTE — Assessment & Plan Note (Signed)
This pleasant 64 year old female with chronic and recurrent sinusitis returns, she for the past several days has had increasing pain and pressure behind her frontal and maxillary sinuses, nasal discharge, she is doing Zyrtec, Flonase, Singulair. Unfortunately with persistence of symptoms she desires aggressive treatment, this worked well at the last episode with azithromycin and prednisone so we will do this again today.

## 2020-02-09 NOTE — Progress Notes (Signed)
   Virtual Visit via WebEx/MyChart   I connected with  [name]  on 02/09/20 via WebEx/MyChart/Doximity Video and verified that I am speaking with the correct person using two identifiers.   I discussed the limitations, risks, security and privacy concerns of performing an evaluation and management service by WebEx/MyChart/Doximity Video, including the higher likelihood of inaccurate diagnosis and treatment, and the availability of in person appointments.  We also discussed the likely need of an additional face to face encounter for complete and high quality delivery of care.  I also discussed with the patient that there may be a patient responsible charge related to this service. The patient expressed understanding and wishes to proceed.  Provider location is either at home or medical facility. Patient location is at their home, different from provider location. People involved in care of the patient during this telehealth encounter were myself, my nurse/medical assistant, and my front office/scheduling team member.  Review of Systems: No fevers, chills, night sweats, weight loss, chest pain, or shortness of breath.   Objective Findings:    General: Speaking full sentences, no audible heavy breathing.  Sounds alert and appropriately interactive.  Appears well.  Face symmetric.  Extraocular movements intact.  Pupils equal and round.  No nasal flaring or accessory muscle use visualized.  Independent interpretation of tests performed by another provider:   None.  Brief History, Exam, Impression, and Recommendations:    Pansinusitis This pleasant 64 year old female with chronic and recurrent sinusitis returns, she for the past several days has had increasing pain and pressure behind her frontal and maxillary sinuses, nasal discharge, she is doing Zyrtec, Flonase, Singulair. Unfortunately with persistence of symptoms she desires aggressive treatment, this worked well at the last episode  with azithromycin and prednisone so we will do this again today.   I discussed the above assessment and treatment plan with the patient. The patient was provided an opportunity to ask questions and all were answered. The patient agreed with the plan and demonstrated an understanding of the instructions.   The patient was advised to call back or seek an in-person evaluation if the symptoms worsen or if the condition fails to improve as anticipated.   I provided 30 minutes of face to face and non-face-to-face time during this encounter date, time was needed to gather information, review chart, records, communicate/coordinate with staff remotely, as well as complete documentation.   ___________________________________________ Gwen Her. Dianah Field, M.D., ABFM., CAQSM. Primary Care and [place] Instructor of [place] of [facility] of Medicine

## 2020-03-31 ENCOUNTER — Other Ambulatory Visit: Payer: Self-pay | Admitting: Sports Medicine

## 2020-04-26 ENCOUNTER — Other Ambulatory Visit: Payer: Self-pay | Admitting: Sports Medicine

## 2020-05-19 ENCOUNTER — Ambulatory Visit (INDEPENDENT_AMBULATORY_CARE_PROVIDER_SITE_OTHER): Payer: Medicare Other | Admitting: Sports Medicine

## 2020-05-19 ENCOUNTER — Ambulatory Visit (INDEPENDENT_AMBULATORY_CARE_PROVIDER_SITE_OTHER): Payer: Medicare Other

## 2020-05-19 ENCOUNTER — Other Ambulatory Visit: Payer: Self-pay

## 2020-05-19 ENCOUNTER — Encounter: Payer: Self-pay | Admitting: Sports Medicine

## 2020-05-19 DIAGNOSIS — M1711 Unilateral primary osteoarthritis, right knee: Secondary | ICD-10-CM

## 2020-05-19 DIAGNOSIS — M1712 Unilateral primary osteoarthritis, left knee: Secondary | ICD-10-CM

## 2020-05-19 NOTE — Progress Notes (Signed)
    Procedures performed today:    Procedure: Real-time Ultrasound Guided injection of the left knee Device: Samsung HS60  Verbal informed consent obtained.  Time-out conducted.  Noted no overlying erythema, induration, or other signs of local infection.  Skin prepped in a sterile fashion.  Local anesthesia: Topical Ethyl chloride.  With sterile technique and under real time ultrasound guidance: 1 cc Kenalog 40, 2 cc lidocaine, 2 cc bupivacaine injected easily Completed without difficulty  Pain immediately resolved suggesting accurate placement of the medication.  Advised to call if fevers/chills, erythema, induration, drainage, or persistent bleeding.  Images permanently stored and available for review in the ultrasound unit.  Impression: Technically successful ultrasound guided injection.  Independent interpretation of notes and tests performed by another provider:   None.  Brief History, Exam, Impression, and Recommendations:    Primary osteoarthritis of left knee This is a pleasant 64 year old female, she has known knee osteoarthritis, last injected over half a decade ago. Unfortunately she is having recurrence of medial joint line pain with gelling, no mechanical symptoms, repeat injection today if she is going on vacation, return to see me in a month, viscosupplementation was not tremendously efficacious, we could certainly try PRP if she fails to get sufficient relief with steroid injections. She does decline NSAIDs and tramadol which have worked in the past.    ___________________________________________ Gwen Her. Dianah Field, M.D., ABFM., CAQSM. Primary Care and [place] Instructor of [place] of [facility] of Medicine

## 2020-05-19 NOTE — Assessment & Plan Note (Signed)
This is a pleasant 64 year old female, she has known knee osteoarthritis, last injected over half a decade ago. Unfortunately she is having recurrence of medial joint line pain with gelling, no mechanical symptoms, repeat injection today if she is going on vacation, return to see me in a month, viscosupplementation was not tremendously efficacious, we could certainly try PRP if she fails to get sufficient relief with steroid injections. She does decline NSAIDs and tramadol which have worked in the past.

## 2020-06-02 DIAGNOSIS — D1801 Hemangioma of skin and subcutaneous tissue: Secondary | ICD-10-CM | POA: Diagnosis not present

## 2020-06-02 DIAGNOSIS — D225 Melanocytic nevi of trunk: Secondary | ICD-10-CM | POA: Diagnosis not present

## 2020-06-02 DIAGNOSIS — L720 Epidermal cyst: Secondary | ICD-10-CM | POA: Diagnosis not present

## 2020-06-02 DIAGNOSIS — L578 Other skin changes due to chronic exposure to nonionizing radiation: Secondary | ICD-10-CM | POA: Diagnosis not present

## 2020-06-09 ENCOUNTER — Telehealth (INDEPENDENT_AMBULATORY_CARE_PROVIDER_SITE_OTHER): Payer: Medicare Other | Admitting: Family Medicine

## 2020-06-09 ENCOUNTER — Encounter: Payer: Self-pay | Admitting: Family Medicine

## 2020-06-09 DIAGNOSIS — J0141 Acute recurrent pansinusitis: Secondary | ICD-10-CM

## 2020-06-09 MED ORDER — AZITHROMYCIN 250 MG PO TABS: ORAL_TABLET | ORAL | 0 refills | Status: DC

## 2020-06-09 MED ORDER — PREDNISONE 20 MG PO TABS
20.0000 mg | ORAL_TABLET | Freq: Two times a day (BID) | ORAL | 0 refills | Status: AC
Start: 2020-06-09 — End: 2020-06-14

## 2020-06-09 NOTE — Progress Notes (Signed)
Symptoms: Sinus HA x 3 days Burning eyes Nausea Nasal Congestion but no runny nose Breath is awful in the morning.   Familiar with sinus infections since age 64.

## 2020-06-09 NOTE — Assessment & Plan Note (Signed)
Recurrent pansinusitis.  She has had good response to z-pack and steroids previously.  Will treat with this again.  She is instructed to follow up if symptoms fail to improve or worsen.

## 2020-06-09 NOTE — Progress Notes (Signed)
Heidi Mclaughlin - 64 y.o. female MRN [id number]  Date of birth: 1956/03/07   This visit type was conducted due to national recommendations for restrictions regarding the COVID-19 Pandemic (e.g. social distancing).  This format is felt to be most appropriate for this patient at this time.  All issues noted in this document were discussed and addressed.  No physical exam was performed (except for noted visual exam findings with Video Visits).  I discussed the limitations of evaluation and management by telemedicine and the availability of in person appointments. The patient expressed understanding and agreed to proceed.  I connected with@ on 06/09/20 at 10:30 AM EDT by a video enabled telemedicine application and verified that I am speaking with the correct person using two identifiers.  Present at visit: Luetta Nutting, DO [name]   Patient Location: Home [address] [place] [address]   Provider location:   [place]  No chief complaint on file.   HPI  Heidi Mclaughlin is a 64 y.o. female who presents via audio/video conferencing for a telehealth visit today.  She has complaint of sinus pain and pressure, congestion, purulent discharge, and post nasal drainage.  This is similar to previous sinus infections which have been recurrent since she was a teenager.  She has seen ENT previously with normal CT scans.  Azithromycin and prednisone have worked well for her in the past.    She denies fever, chills, body aches, cough, shortness of breath or GI symptoms.    ROS:  A comprehensive ROS was completed and negative except as noted per HPI  Past Medical History:  Diagnosis Date  . Allergy   . Allergy-induced asthma   . Arthritis   . Hyperlipidemia   . Hypertension   . Irregular heart rhythm   . RLS (restless legs syndrome)     Past Surgical History:  Procedure Laterality Date  . ABDOMINAL HYSTERECTOMY  2003  . APPENDECTOMY  1982  . CHOLECYSTECTOMY  1982  . INCONTINENCE  SURGERY  2003  . [place]  . TUBAL LIGATION  1992    Family History  Problem Relation Age of Onset  . Cancer Mother        lung  . Hypertension Father   . Heart attack Father   . Prostate cancer Maternal Uncle   . Colon cancer Neg Hx   . Esophageal cancer Neg Hx   . Rectal cancer Neg Hx   . Stomach cancer Neg Hx     Social History   Socioeconomic History  . Marital status: Married    Spouse name: Not on file  . Number of children: Not on file  . Years of education: Not on file  . Highest education level: Not on file  Occupational History  . Not on file  Tobacco Use  . Smoking status: Former Smoker    Quit date: 11/13/1972    Years since quitting: 47.6  . Smokeless tobacco: Never Used  Vaping Use  . Vaping Use: Never used  Substance and Sexual Activity  . Alcohol use: Yes    Alcohol/week: 1.0 standard drink    Types: 1 Glasses of wine per week  . Drug use: No  . Sexual activity: Not Currently    Birth control/protection: Surgical  Other Topics Concern  . Not on file  Social History Narrative  . Not on file   Social Determinants of Health   Financial Resource Strain:   . Difficulty of Paying Living Expenses:  Food Insecurity:   . Worried About Charity fundraiser in the Last Year:   . Arboriculturist in the Last Year:   Transportation Needs:   . Film/video editor (Medical):   Marland Kitchen Lack of Transportation (Non-Medical):   Physical Activity:   . Days of Exercise per Week:   . Minutes of Exercise per Session:   Stress:   . Feeling of Stress :   Social Connections:   . Frequency of Communication with Friends and Family:   . Frequency of Social Gatherings with Friends and Family:   . Attends Religious Services:   . Active Member of Clubs or Organizations:   . Attends Archivist Meetings:   Marland Kitchen Marital Status:   Intimate Partner Violence:   . Fear of Current or Ex-Partner:   . Emotionally Abused:   Marland Kitchen Physically  Abused:   . Sexually Abused:      Current Outpatient Medications:  .  albuterol (VENTOLIN HFA) 108 (90 Base) MCG/ACT inhaler, TAKE 2 PUFFS BY MOUTH EVERY 6 HOURS AS NEEDED FOR WHEEZE OR SHORTNESS OF BREATH, Disp: 18 g, Rfl: 0 .  cetirizine (ZYRTEC) 10 MG tablet, Take 10 mg by mouth., Disp: , Rfl:  .  folic acid (FOLVITE) 1 MG tablet, TAKE 1 TABLET BY MOUTH EVERY DAY, Disp: 90 tablet, Rfl: 2 .  hydrochlorothiazide (HYDRODIURIL) 25 MG tablet, TAKE 1 TABLET BY MOUTH EVERY DAY, Disp: 90 tablet, Rfl: 3 .  mometasone (NASONEX) 50 MCG/ACT nasal spray, One spray in each nostril twice a day, use left hand for right nostril, and right hand for left nostril.  Please dispense one bottle., Disp: 1 g, Rfl: 6 .  montelukast (SINGULAIR) 10 MG tablet, TAKE 1 TABLET BY MOUTH EVERYDAY AT BEDTIME, Disp: 90 tablet, Rfl: 1 .  rOPINIRole (REQUIP) 1 MG tablet, TAKE 1 TABLET (1 MG TOTAL) BY MOUTH AT BEDTIME., Disp: 90 tablet, Rfl: 1 .  simvastatin (ZOCOR) 40 MG tablet, TAKE 1 TABLET BY MOUTH EVERY DAY, Disp: 90 tablet, Rfl: 3 .  valACYclovir (VALTREX) 500 MG tablet, TAKE 1 TABLET (500 MG TOTAL) BY MOUTH AS NEEDED., Disp: 90 tablet, Rfl: 0 .  azithromycin (ZITHROMAX) 250 MG tablet, Take 2 tabs on first day then 1 tab on days 2-5., Disp: 6 tablet, Rfl: 0 .  predniSONE (DELTASONE) 20 MG tablet, Take 1 tablet (20 mg total) by mouth 2 (two) times daily with a meal for 5 days., Disp: 10 tablet, Rfl: 0  EXAM:  VITALS per patient if applicable: Temp 16.1 F (36.6 C)   Wt 158 lb 3.2 oz (71.8 kg)   BMI 28.94 kg/m   GENERAL: alert, oriented, appears well and in no acute distress  HEENT: atraumatic, conjunttiva clear, no obvious abnormalities on inspection of external nose and ears  NECK: normal movements of the head and neck  LUNGS: on inspection no signs of respiratory distress, breathing rate appears normal, no obvious gross SOB, gasping or wheezing  CV: no obvious cyanosis  MS: moves all visible extremities  without noticeable abnormality  PSYCH/NEURO: pleasant and cooperative, no obvious depression or anxiety, speech and thought processing grossly intact  ASSESSMENT AND PLAN:  Discussed the following assessment and plan:  Pansinusitis Recurrent pansinusitis.  She has had good response to z-pack and steroids previously.  Will treat with this again.  She is instructed to follow up if symptoms fail to improve or worsen.       I discussed the assessment and treatment plan with  the patient. The patient was provided an opportunity to ask questions and all were answered. The patient agreed with the plan and demonstrated an understanding of the instructions.   The patient was advised to call back or seek an in-person evaluation if the symptoms worsen or if the condition fails to improve as anticipated.    Luetta Nutting, DO

## 2020-06-21 DIAGNOSIS — Z20822 Contact with and (suspected) exposure to covid-19: Secondary | ICD-10-CM | POA: Diagnosis not present

## 2020-07-15 DIAGNOSIS — L820 Inflamed seborrheic keratosis: Secondary | ICD-10-CM | POA: Diagnosis not present

## 2020-07-15 DIAGNOSIS — D485 Neoplasm of uncertain behavior of skin: Secondary | ICD-10-CM | POA: Diagnosis not present

## 2020-07-23 ENCOUNTER — Ambulatory Visit (INDEPENDENT_AMBULATORY_CARE_PROVIDER_SITE_OTHER): Payer: Medicare Other | Admitting: Family Medicine

## 2020-07-23 VITALS — Temp 97.9°F

## 2020-07-23 DIAGNOSIS — Z23 Encounter for immunization: Secondary | ICD-10-CM | POA: Diagnosis not present

## 2020-07-23 NOTE — Progress Notes (Signed)
Agree with documentation as above.   Prajna Vanderpool, MD  

## 2020-07-29 ENCOUNTER — Other Ambulatory Visit: Payer: Self-pay | Admitting: Sports Medicine

## 2020-07-29 DIAGNOSIS — J329 Chronic sinusitis, unspecified: Secondary | ICD-10-CM

## 2020-08-13 DIAGNOSIS — Z23 Encounter for immunization: Secondary | ICD-10-CM | POA: Diagnosis not present

## 2020-09-17 LAB — HM MAMMOGRAPHY

## 2020-09-24 DIAGNOSIS — Z1231 Encounter for screening mammogram for malignant neoplasm of breast: Secondary | ICD-10-CM | POA: Diagnosis not present

## 2020-09-24 DIAGNOSIS — Z803 Family history of malignant neoplasm of breast: Secondary | ICD-10-CM | POA: Diagnosis not present

## 2020-10-01 ENCOUNTER — Ambulatory Visit: Payer: Medicare Other

## 2020-10-06 ENCOUNTER — Telehealth (INDEPENDENT_AMBULATORY_CARE_PROVIDER_SITE_OTHER): Payer: Medicare Other | Admitting: Family Medicine

## 2020-10-06 ENCOUNTER — Encounter: Payer: Self-pay | Admitting: Family Medicine

## 2020-10-06 DIAGNOSIS — J0141 Acute recurrent pansinusitis: Secondary | ICD-10-CM

## 2020-10-06 MED ORDER — DOXYCYCLINE HYCLATE 100 MG PO TABS: 100.0000 mg | ORAL_TABLET | Freq: Two times a day (BID) | ORAL | 0 refills | Status: DC

## 2020-10-06 NOTE — Progress Notes (Signed)
Symptoms started Tuesday  Laryngitis Runny nose Cough Deep, chest cough Loss of appetite Runny eyes Sinus Headache  Worse daily. 99.6 98.6 after tylenol 91% Hx: Allergy induced asthma

## 2020-10-06 NOTE — Assessment & Plan Note (Addendum)
I think this was initially a viral uri however with current symptoms and elevation of temp I think she has developed a bacterial sinus infection.   Will cover with doxycycline 100mg  BID x10 days.  Recommend continued supportive care with increased fluids and rest.   Instructed to call for follow up if symptoms are not improving.

## 2020-10-06 NOTE — Progress Notes (Signed)
Heidi Mclaughlin - 64 y.o. female MRN [id number]  Date of birth: Apr 24, 1956   This visit type was conducted due to national recommendations for restrictions regarding the COVID-19 Pandemic (e.g. social distancing).  This format is felt to be most appropriate for this patient at this time.  All issues noted in this document were discussed and addressed.  No physical exam was performed (except for noted visual exam findings with Video Visits).  I discussed the limitations of evaluation and management by telemedicine and the availability of in person appointments. The patient expressed understanding and agreed to proceed.  I connected with@ on 10/06/20 at 11:30 AM EST by a video enabled telemedicine application and verified that I am speaking with the correct person using two identifiers.  Present at visit: Luetta Nutting, DO [name]   Patient Location: Home [address] [place] [address]   Provider location:   [facility]  Chief Complaint  Patient presents with  . Nasal Congestion    HPI  Heidi Mclaughlin is a 64 y.o. female who presents via audio/video conferencing for a telehealth visit today.  She has complaint of runny nose, cough, decreased appetite, sinus pain and headache.  Her symptoms started 8 days ago.  No shortness of breath, body aches or GI symptoms.  She thought she was improving however symptoms started worsening over the past couple of days.  Temp is mildly elevated at 99.6 today.  Her temp has been normal until today.  She does tend to develop sinus infection frequently.  She has had COVID vaccine as well as booster.     ROS:  A comprehensive ROS was completed and negative except as noted per HPI  Past Medical History:  Diagnosis Date  . Allergy   . Allergy-induced asthma   . Arthritis   . Hyperlipidemia   . Hypertension   . Irregular heart rhythm   . RLS (restless legs syndrome)     Past Surgical History:  Procedure Laterality Date  . ABDOMINAL  HYSTERECTOMY  2003  . APPENDECTOMY  1982  . CHOLECYSTECTOMY  1982  . INCONTINENCE SURGERY  2003  . [place]  . TUBAL LIGATION  1992    Family History  Problem Relation Age of Onset  . Cancer Mother        lung  . Hypertension Father   . Heart attack Father   . Prostate cancer Maternal Uncle   . Colon cancer Neg Hx   . Esophageal cancer Neg Hx   . Rectal cancer Neg Hx   . Stomach cancer Neg Hx     Social History   Socioeconomic History  . Marital status: Married    Spouse name: Not on file  . Number of children: Not on file  . Years of education: Not on file  . Highest education level: Not on file  Occupational History  . Not on file  Tobacco Use  . Smoking status: Former Smoker    Quit date: 11/13/1972    Years since quitting: 47.9  . Smokeless tobacco: Never Used  Vaping Use  . Vaping Use: Never used  Substance and Sexual Activity  . Alcohol use: Yes    Alcohol/week: 1.0 standard drink    Types: 1 Glasses of wine per week  . Drug use: No  . Sexual activity: Not Currently    Birth control/protection: Surgical  Other Topics Concern  . Not on file  Social History Narrative  . Not on file  Social Determinants of Health   Financial Resource Strain:   . Difficulty of Paying Living Expenses: Not on file  Food Insecurity:   . Worried About Charity fundraiser in the Last Year: Not on file  . Ran Out of Food in the Last Year: Not on file  Transportation Needs:   . Lack of Transportation (Medical): Not on file  . Lack of Transportation (Non-Medical): Not on file  Physical Activity:   . Days of Exercise per Week: Not on file  . Minutes of Exercise per Session: Not on file  Stress:   . Feeling of Stress : Not on file  Social Connections:   . Frequency of Communication with Friends and Family: Not on file  . Frequency of Social Gatherings with Friends and Family: Not on file  . Attends Religious Services: Not on file  . Active  Member of Clubs or Organizations: Not on file  . Attends Archivist Meetings: Not on file  . Marital Status: Not on file  Intimate Partner Violence:   . Fear of Current or Ex-Partner: Not on file  . Emotionally Abused: Not on file  . Physically Abused: Not on file  . Sexually Abused: Not on file     Current Outpatient Medications:  .  cetirizine (ZYRTEC) 10 MG tablet, Take 10 mg by mouth., Disp: , Rfl:  .  folic acid (FOLVITE) 1 MG tablet, TAKE 1 TABLET BY MOUTH EVERY DAY, Disp: 90 tablet, Rfl: 2 .  hydrochlorothiazide (HYDRODIURIL) 25 MG tablet, TAKE 1 TABLET BY MOUTH EVERY DAY, Disp: 90 tablet, Rfl: 3 .  mometasone (NASONEX) 50 MCG/ACT nasal spray, One spray in each nostril twice a day, use left hand for right nostril, and right hand for left nostril.  Please dispense one bottle., Disp: 1 g, Rfl: 6 .  montelukast (SINGULAIR) 10 MG tablet, TAKE 1 TABLET BY MOUTH EVERYDAY AT BEDTIME, Disp: 90 tablet, Rfl: 1 .  rOPINIRole (REQUIP) 1 MG tablet, TAKE 1 TABLET (1 MG TOTAL) BY MOUTH AT BEDTIME., Disp: 90 tablet, Rfl: 1 .  simvastatin (ZOCOR) 40 MG tablet, TAKE 1 TABLET BY MOUTH EVERY DAY, Disp: 90 tablet, Rfl: 3 .  valACYclovir (VALTREX) 500 MG tablet, TAKE 1 TABLET (500 MG TOTAL) BY MOUTH AS NEEDED., Disp: 90 tablet, Rfl: 0 .  VENTOLIN HFA 108 (90 Base) MCG/ACT inhaler, TAKE 2 PUFFS BY MOUTH EVERY 6 HOURS AS NEEDED FOR WHEEZE OR SHORTNESS OF BREATH, Disp: 18 each, Rfl: 11 .  doxycycline (VIBRA-TABS) 100 MG tablet, Take 1 tablet (100 mg total) by mouth 2 (two) times daily., Disp: 20 tablet, Rfl: 0  EXAM:  VITALS per patient if applicable: Temp 22.0 F (37.6 C)   Wt 153 lb (69.4 kg)   SpO2 91%   BMI 27.98 kg/m   GENERAL: alert, oriented, appears well and in no acute distress  HEENT: atraumatic, conjunttiva clear, no obvious abnormalities on inspection of external nose and ears  NECK: normal movements of the head and neck  LUNGS: on inspection no signs of respiratory  distress, breathing rate appears normal, no obvious gross SOB, gasping or wheezing  CV: no obvious cyanosis  MS: moves all visible extremities without noticeable abnormality  PSYCH/NEURO: pleasant and cooperative, no obvious depression or anxiety, speech and thought processing grossly intact  ASSESSMENT AND PLAN:  Discussed the following assessment and plan:  Pansinusitis I think this was initially a viral uri however with current symptoms and elevation of temp I think she has developed  a bacterial sinus infection.   Will cover with doxycycline 100mg  BID x10 days.  Recommend continued supportive care with increased fluids and rest.   Instructed to call for follow up if symptoms are not improving.      I discussed the assessment and treatment plan with the patient. The patient was provided an opportunity to ask questions and all were answered. The patient agreed with the plan and demonstrated an understanding of the instructions.   The patient was advised to call back or seek an in-person evaluation if the symptoms worsen or if the condition fails to improve as anticipated.    Luetta Nutting, DO

## 2020-10-09 DIAGNOSIS — Z20822 Contact with and (suspected) exposure to covid-19: Secondary | ICD-10-CM | POA: Diagnosis not present

## 2020-10-12 ENCOUNTER — Ambulatory Visit (INDEPENDENT_AMBULATORY_CARE_PROVIDER_SITE_OTHER): Payer: Medicare Other | Admitting: Sports Medicine

## 2020-10-12 ENCOUNTER — Other Ambulatory Visit: Payer: Self-pay

## 2020-10-12 VITALS — BP 144/83 | HR 67

## 2020-10-12 DIAGNOSIS — I10 Essential (primary) hypertension: Secondary | ICD-10-CM | POA: Diagnosis not present

## 2020-10-12 DIAGNOSIS — J0141 Acute recurrent pansinusitis: Secondary | ICD-10-CM | POA: Diagnosis not present

## 2020-10-12 DIAGNOSIS — Z9889 Other specified postprocedural states: Secondary | ICD-10-CM

## 2020-10-12 DIAGNOSIS — Z01818 Encounter for other preprocedural examination: Secondary | ICD-10-CM

## 2020-10-12 NOTE — Assessment & Plan Note (Signed)
Blood pressure is a bit elevated but she is currently taking decongestants for a sinobronchitis.

## 2020-10-12 NOTE — Assessment & Plan Note (Signed)
Today we did a preoperative clearance for blepharoplasty, she is greater than 4 metabolic equivalents of cardiac capacity, per her request we are checking a CBC, BMP, ECG is normal sinus rhythm normal PR interval, no ST changes. She is taking a decongestant which would explain her blood pressure being in the one forties systolic. I think she should wait till her respiratory symptoms have resolved before proceeding with operative intervention. Because the operative date is November 17, 2020 I do not see any reason to postpone.

## 2020-10-12 NOTE — Assessment & Plan Note (Signed)
Now having a cough, lungs are clear. Continue doxycycline, continue over-the-counter cold and flu medications, decongestants for now is fine. No further intervention needed.

## 2020-10-12 NOTE — Progress Notes (Signed)
    Procedures performed today:    Twelve-lead ECG personally reviewed, normal sinus rhythm, normal rate, no ST changes, no PR changes.  Independent interpretation of notes and tests performed by another provider:   None.  Brief History, Exam, Impression, and Recommendations:    History of blepharoplasty Today we did a preoperative clearance for blepharoplasty, she is greater than 4 metabolic equivalents of cardiac capacity, per her request we are checking a CBC, BMP, ECG is normal sinus rhythm normal PR interval, no ST changes. She is taking a decongestant which would explain her blood pressure being in the one forties systolic. I think she should wait till her respiratory symptoms have resolved before proceeding with operative intervention. Because the operative date is November 17, 2020 I do not see any reason to postpone.   Benign essential hypertension Blood pressure is a bit elevated but she is currently taking decongestants for a sinobronchitis.  Pansinusitis Now having a cough, lungs are clear. Continue doxycycline, continue over-the-counter cold and flu medications, decongestants for now is fine. No further intervention needed.    ___________________________________________ Gwen Her. Dianah Field, M.D., ABFM., CAQSM. Primary Care and [place] Instructor of [place] [facility] [employer]

## 2020-10-14 ENCOUNTER — Other Ambulatory Visit: Payer: Self-pay | Admitting: Sports Medicine

## 2020-10-19 DIAGNOSIS — R922 Inconclusive mammogram: Secondary | ICD-10-CM | POA: Diagnosis not present

## 2020-10-19 DIAGNOSIS — R928 Other abnormal and inconclusive findings on diagnostic imaging of breast: Secondary | ICD-10-CM | POA: Diagnosis not present

## 2020-10-19 LAB — HM MAMMOGRAPHY

## 2020-10-20 DIAGNOSIS — I10 Essential (primary) hypertension: Secondary | ICD-10-CM | POA: Diagnosis not present

## 2020-10-21 LAB — COMPREHENSIVE METABOLIC PANEL
AG Ratio: 1.9 (calc) (ref 1.0–2.5)
ALT: 21 U/L (ref 6–29)
AST: 20 U/L (ref 10–35)
Albumin: 4.2 g/dL (ref 3.6–5.1)
Alkaline phosphatase (APISO): 48 U/L (ref 37–153)
BUN: 18 mg/dL (ref 7–25)
CO2: 28 mmol/L (ref 20–32)
Calcium: 9.7 mg/dL (ref 8.6–10.4)
Chloride: 99 mmol/L (ref 98–110)
Creat: 0.92 mg/dL (ref 0.50–0.99)
Globulin: 2.2 g/dL (calc) (ref 1.9–3.7)
Glucose, Bld: 93 mg/dL (ref 65–99)
Potassium: 4.3 mmol/L (ref 3.5–5.3)
Sodium: 137 mmol/L (ref 135–146)
Total Bilirubin: 0.4 mg/dL (ref 0.2–1.2)
Total Protein: 6.4 g/dL (ref 6.1–8.1)

## 2020-10-21 LAB — CBC
HCT: 43.5 % (ref 35.0–45.0)
Hemoglobin: 14.8 g/dL (ref 11.7–15.5)
MCH: 29.6 pg (ref 27.0–33.0)
MCHC: 34 g/dL (ref 32.0–36.0)
MCV: 87 fL (ref 80.0–100.0)
MPV: 10 fL (ref 7.5–12.5)
Platelets: 313 10*3/uL (ref 140–400)
RBC: 5 10*6/uL (ref 3.80–5.10)
RDW: 12.2 % (ref 11.0–15.0)
WBC: 4.3 10*3/uL (ref 3.8–10.8)

## 2020-10-21 LAB — LIPID PANEL
Cholesterol: 184 mg/dL (ref <200)
HDL: 76 mg/dL (ref >=50)
LDL Cholesterol (Calc): 89 mg/dL (calc)
Non-HDL Cholesterol (Calc): 108 mg/dL (calc) (ref <130)
Total CHOL/HDL Ratio: 2.4 (calc) (ref <5.0)
Triglycerides: 97 mg/dL (ref <150)

## 2020-10-21 LAB — TSH: TSH: 1.4 mIU/L (ref 0.40–4.50)

## 2020-12-27 ENCOUNTER — Encounter: Payer: Self-pay | Admitting: Family Medicine

## 2020-12-27 ENCOUNTER — Telehealth (INDEPENDENT_AMBULATORY_CARE_PROVIDER_SITE_OTHER): Payer: Medicare Other | Admitting: Family Medicine

## 2020-12-27 VITALS — BP 116/76 | Temp 98.1°F

## 2020-12-27 DIAGNOSIS — J0101 Acute recurrent maxillary sinusitis: Secondary | ICD-10-CM

## 2020-12-27 DIAGNOSIS — R053 Chronic cough: Secondary | ICD-10-CM | POA: Diagnosis not present

## 2020-12-27 MED ORDER — MOMETASONE FUROATE 50 MCG/ACT NA SUSP: NASAL | 6 refills | Status: DC

## 2020-12-27 MED ORDER — AZITHROMYCIN 250 MG PO TABS: ORAL_TABLET | ORAL | 0 refills | Status: DC

## 2020-12-27 MED ORDER — DOXYCYCLINE HYCLATE 100 MG PO TABS: 100.0000 mg | ORAL_TABLET | Freq: Two times a day (BID) | ORAL | 0 refills | Status: DC

## 2020-12-27 NOTE — Progress Notes (Signed)
No answer LVM to call the office

## 2020-12-27 NOTE — Progress Notes (Signed)
Virtual Video Visit via MyChart Note  I connected with  [name] on 12/27/20 at  1:00 PM EST by the video enabled telemedicine application for , MyChart, and verified that I am speaking with the correct person using two identifiers.   I introduced myself as a Designer, jewellery with the practice. We discussed the limitations of evaluation and management by telemedicine and the availability of in person appointments. The patient expressed understanding and agreed to proceed.  Participating parties in this visit include: The patient and the nurse practitioner listed.  The patient is: At home I am: In the office - Primary Care [place]  Subjective:    CC:  Chief Complaint  Patient presents with  . Sinusitis    HPI: Heidi Mclaughlin is a 65 y.o. year old female presenting today via [place] today for possible sinus infection.  She reports that she started feeling bad about 2 or 3 days ago started with 3 out of 10 sinus pressure that has progressed to 7 out of 10 sinus pressure with headache now.  She also reports rhinorrhea with colored mucus. Reports she feel miserable. She denies chest pain, shortness of breath, fevers, worsening cough, GI symptoms.  She denies any sick contacts and states that she is fully vaccinated against COVID plus a booster.  She has been trying her regular regimen of Zyrtec, Singulair, Mucinex, and nasal spray.  She is also trying to stay hydrated and eat spicy foods to help open her sinuses.  She reports that she tends to have really bad allergies every year around January, possibly mold related.  She states that she almost always developes a sinus infection around this time of year.  Currently she is out in [place] visiting her daughter, and she is afraid that this will continue to progress into a bad sinus infection like it has consistently in the past.   Past medical history, Surgical history, Family history not pertinant except as noted below, Social history,  Allergies, and medications have been entered into the medical record, reviewed, and corrections made.   Review of Systems:  All review of systems negative except what is listed in the HPI   Objective:    General:  Speaking clearly in complete sentences. Absent shortness of breath noted.   Alert and oriented x3.   Normal judgment.  Absent acute distress.   Impression and Recommendations:    1. Chronic cough  2. Acute recurrent maxillary sinusitis   Reminded patient that it is still too early in the course to warrant an antibiotic.  However I do recommend Covid testing somewhere in the community where she is - currently in [place] visiting daughter's family.  Given her history of sinus infections around this time of year, she is really afraid that it will progress while she is out of town.  Discussed pros and cons of starting antibiotic this early in the course with patient, and we agreed that I would send her a prescription for a Z-Pak (she reports this worked better/fewer side effects than doxy which she was on in November), however recommend she wait several days to see if she improves prior to beginning the antibiotic.  Encouraged her to continue her allergy regimen including Zyrtec, Mucinex, Singulair, nasal spray.  Also recommend using a humidifier and warm compresses for sinus pain.  Educated on quarantine recommendations if Covid test comes back positive - will also attach to AVS through my chart.  If Covid test is negative, and if symptoms do not  improve or worsen and fever develops, go ahead and start antibiotic. Patient also requested one bottle of her nose spray be called in as she is almost out of what she packed for the trip.   Follow-up if symptoms worsen or fail to improve. Education provided on warning signs to seek urgent evaluation.   I discussed the assessment and treatment plan with the patient. The patient was provided an opportunity to ask questions and all were answered.  The patient agreed with the plan and demonstrated an understanding of the instructions.   The patient was advised to call back or seek an in-person evaluation if the symptoms worsen or if the condition fails to improve as anticipated.  I provided 20 minutes of non-face-to-face interaction with this [place] visit including intake, same-day documentation, and chart review.   Terrilyn Saver, NP

## 2020-12-27 NOTE — Patient Instructions (Signed)
Recommend you get a COVID test today. Recommendations for quarantine discussed with patient.  OTC medications recommended: Mucinex, Flonase, Sudafed (if tolerated). Monitor for severe symptoms- report to ED for Shortness of breath, chest pain, fevers above 103 Follow-up if symptoms worsen or fail to improve.   Over the counter medications that may be helpful for symptoms:    . Guaifenesin 1200 mg extended release tabs twice daily, with plenty of water o For cough and congestion o Brand name: Mucinex   . Pseudoephedrine 30 mg, one or two tabs every 4 to 6 hours o For sinus congestion o Brand name: Sudafed o You must get this from the pharmacy counter.  . Oxymetazoline nasal spray each morning, one spray in each nostril, for NO MORE THAN 3 days  o For nasal and sinus congestion o Brand name: Afrin . Saline nasal spray or Saline Nasal Irrigation 3-5 times a day o For nasal and sinus congestion o Brand names: [place] or AYR . Fluticasone nasal spray, one spray in each nostril, each morning (after oxymetazoline and saline, if used) o For nasal and sinus congestion o Brand name: Flonase . Warm salt water gargles  o For sore throat o Every few hours as needed . Alternate ibuprofen 400-600 mg and acetaminophen 1000 mg every 4-6 hours o For fever, body aches, headache o Brand names: Motrin or Advil and Tylenol . Dextromethorphan 12-hour cough version 30 mg every 12 hours  o For cough o Brand name: Delsym Stop all other cold medications for now (Nyquil, Dayquil, Tylenol Cold, Theraflu, etc) and other non-prescription cough/cold preparations. Many of these have the same ingredients listed above and could cause an overdose of medication.   Herbal treatments that have been shown to be helpful in some patients include: Vitamin C 1000mg  per day Vitamin D 4000iU per day Zinc 100mg  per day Quercetin 25-500mg  twice a day Melatonin 5-10mg  at bedtime  General Instructions . Allow your body to  rest . Drink PLENTY of fluids . Isolate yourself from everyone, even family, until test results have returned  If your COVID-19 test is positive . Then you ARE INFECTED and you can pass the virus to others . You must quarantine from others for a minimum of  o 10 days since symptoms started AND o You are fever free for 24 hours WITHOUT any medication to reduce fever AND o Your symptoms are improving . Do not go to the store or other public areas . Do not go around household members who are not known to be infected with COVID-19 . If you MUST leave your area of quarantine (example: go to a bathroom you share with others in your home), you must o Wear a mask o Wash your hands thoroughly o Wipe down any surfaces you touch . Do not share food, drinks, towels, or other items with other persons . Dispose of your own tissues, food containers, etc  Once you have recovered, please continue good preventive care measures, including:  . wearing a mask when in public . wash your hands frequently . avoid touching your face/nose/eyes . cover coughs/sneezes with the inside of your elbow . stay out of crowds . keep a 6 foot distance from others  If you develop severe shortness of breath, uncontrolled fevers, coughing up blood, confusion, chest pain, or signs of dehydration (such as significantly decreased urine amounts or dizziness with standing) please go to the ER.

## 2020-12-27 NOTE — Addendum Note (Signed)
Addended by: Caleen Jobs B on: 12/27/2020 02:26 PM   Modules accepted: Orders

## 2020-12-30 ENCOUNTER — Other Ambulatory Visit: Payer: Self-pay

## 2020-12-30 ENCOUNTER — Ambulatory Visit (INDEPENDENT_AMBULATORY_CARE_PROVIDER_SITE_OTHER): Payer: Medicare Other

## 2020-12-30 ENCOUNTER — Ambulatory Visit (INDEPENDENT_AMBULATORY_CARE_PROVIDER_SITE_OTHER): Payer: Medicare Other | Admitting: Family Medicine

## 2020-12-30 ENCOUNTER — Encounter: Payer: Self-pay | Admitting: Family Medicine

## 2020-12-30 VITALS — BP 136/83 | HR 68 | Temp 97.9°F | Wt 157.1 lb

## 2020-12-30 DIAGNOSIS — J168 Pneumonia due to other specified infectious organisms: Secondary | ICD-10-CM | POA: Diagnosis not present

## 2020-12-30 DIAGNOSIS — R042 Hemoptysis: Secondary | ICD-10-CM | POA: Diagnosis not present

## 2020-12-30 DIAGNOSIS — R059 Cough, unspecified: Secondary | ICD-10-CM

## 2020-12-30 DIAGNOSIS — J9811 Atelectasis: Secondary | ICD-10-CM | POA: Diagnosis not present

## 2020-12-30 NOTE — Patient Instructions (Signed)
Finish z-pack Chest x-ray today to check for pneumonia

## 2020-12-30 NOTE — Progress Notes (Addendum)
Acute Office Visit  Subjective:    Patient ID: Heidi Mclaughlin, female    DOB: 12-23-1955, 65 y.o.   MRN: [id number]  No chief complaint on file.   HPI Patient is in today for coughing up blood.   Patient was seen for televisit on Monday with potential sinusitis vs. URI. She was encouraged to get tested for COVID, but did not. She was given a delayed-start antibiotic which she reports starting on Wednesday when symptoms continued to worsen. Last night she noticed some pink tinged sputum with her cough and did report seeing one blueberry-size, maroon blood clot with a coughing episode around 10pm last night. No more clots noticed, but continues to report some mild pink-tinged sputum whenever she coughs anything up. She is concerned because she has never had this happen before. She reports the sinusitis symptoms are already starting to improve after starting the z-pack and she feel like her congestion is slightly better, hot showers are helping. She denies fever, night sweats, but does report some superficial chest pressure/tightness with coughing. States her chronic congestion seems to be worse at night when lying down. She denies any GI symptoms.     Past Medical History:  Diagnosis Date  . Allergy   . Allergy-induced asthma   . Arthritis   . Hyperlipidemia   . Hypertension   . Irregular heart rhythm   . RLS (restless legs syndrome)     Past Surgical History:  Procedure Laterality Date  . ABDOMINAL HYSTERECTOMY  2003  . APPENDECTOMY  1982  . CHOLECYSTECTOMY  1982  . INCONTINENCE SURGERY  2003  . [place]  . TUBAL LIGATION  1992    Family History  Problem Relation Age of Onset  . Cancer Mother        lung  . Hypertension Father   . Heart attack Father   . Prostate cancer Maternal Uncle   . Colon cancer Neg Hx   . Esophageal cancer Neg Hx   . Rectal cancer Neg Hx   . Stomach cancer Neg Hx     Social History   Socioeconomic History  .  Marital status: Married    Spouse name: Not on file  . Number of children: Not on file  . Years of education: Not on file  . Highest education level: Not on file  Occupational History  . Not on file  Tobacco Use  . Smoking status: Former Smoker    Quit date: 11/13/1972    Years since quitting: 48.1  . Smokeless tobacco: Never Used  Vaping Use  . Vaping Use: Never used  Substance and Sexual Activity  . Alcohol use: Yes    Alcohol/week: 1.0 standard drink    Types: 1 Glasses of wine per week  . Drug use: No  . Sexual activity: Not Currently    Birth control/protection: Surgical  Other Topics Concern  . Not on file  Social History Narrative  . Not on file   Social Determinants of Health   Financial Resource Strain: Not on file  Food Insecurity: Not on file  Transportation Needs: Not on file  Physical Activity: Not on file  Stress: Not on file  Social Connections: Not on file  Intimate Partner Violence: Not on file    Outpatient Medications Prior to Visit  Medication Sig Dispense Refill  . azithromycin (ZITHROMAX Z-PAK) 250 MG tablet Take 2 tablets (500 mg) on  Day 1,  followed by 1 tablet (250 mg) once daily  on Days 2 through 5. 6 tablet 0  . cetirizine (ZYRTEC) 10 MG tablet Take 10 mg by mouth.    . folic acid (FOLVITE) 1 MG tablet TAKE 1 TABLET BY MOUTH EVERY DAY 90 tablet 2  . hydrochlorothiazide (HYDRODIURIL) 25 MG tablet TAKE 1 TABLET BY MOUTH EVERY DAY 90 tablet 3  . mometasone (NASONEX) 50 MCG/ACT nasal spray One spray in each nostril twice a day, use left hand for right nostril, and right hand for left nostril.  Please dispense one bottle. 1 g 6  . montelukast (SINGULAIR) 10 MG tablet TAKE 1 TABLET BY MOUTH EVERYDAY AT BEDTIME 90 tablet 1  . rOPINIRole (REQUIP) 1 MG tablet TAKE 1 TABLET (1 MG TOTAL) BY MOUTH AT BEDTIME. 90 tablet 1  . simvastatin (ZOCOR) 40 MG tablet TAKE 1 TABLET BY MOUTH EVERY DAY 90 tablet 3  . valACYclovir (VALTREX) 500 MG tablet TAKE 1 TABLET  (500 MG TOTAL) BY MOUTH AS NEEDED. 90 tablet 0  . VENTOLIN HFA 108 (90 Base) MCG/ACT inhaler TAKE 2 PUFFS BY MOUTH EVERY 6 HOURS AS NEEDED FOR WHEEZE OR SHORTNESS OF BREATH 18 each 11   No facility-administered medications prior to visit.    Allergies  Allergen Reactions  . Amoxicillin Anaphylaxis  . Penicillins Anaphylaxis  . Pentazocine-Naloxone Hcl Anxiety and Other (See Comments)    insomnia insomnia   . Poison Ivy Extract [Poison Ivy Extract] Rash    Review of Systems All review of systems negative except what is listed in the HPI     Objective:    Physical Exam Vitals reviewed.  Constitutional:      Appearance: Normal appearance.  HENT:     Head: Normocephalic and atraumatic.  Cardiovascular:     Rate and Rhythm: Normal rate and regular rhythm.  Pulmonary:     Effort: Pulmonary effort is normal. No respiratory distress.     Breath sounds: No stridor. No wheezing or rhonchi.     Comments: Bases diminished, L base mildly course, No sputum produced this visit, no coughing observed this visit Chest:     Chest wall: No tenderness.  Abdominal:     General: Bowel sounds are normal.     Palpations: Abdomen is soft.  Skin:    General: Skin is warm and dry.  Neurological:     General: No focal deficit present.     Mental Status: She is alert and oriented to person, place, and time. Mental status is at baseline.     There were no vitals taken for this visit. Wt Readings from Last 3 Encounters:  10/06/20 153 lb (69.4 kg)  06/09/20 158 lb 3.2 oz (71.8 kg)  02/09/20 159 lb (72.1 kg)    Health Maintenance Due  Topic Date Due  . PNA vac Low Risk Adult (2 of 2 - PCV13) 10/14/2020    There are no preventive care reminders to display for this patient.   Lab Results  Component Value Date   TSH 1.40 10/20/2020   Lab Results  Component Value Date   WBC 4.3 10/20/2020   HGB 14.8 10/20/2020   HCT 43.5 10/20/2020   MCV 87.0 10/20/2020   PLT 313 10/20/2020   Lab  Results  Component Value Date   NA 137 10/20/2020   K 4.3 10/20/2020   CO2 28 10/20/2020   GLUCOSE 93 10/20/2020   BUN 18 10/20/2020   CREATININE 0.92 10/20/2020   BILITOT 0.4 10/20/2020   ALKPHOS 46 08/17/2016   AST 20  10/20/2020   ALT 21 10/20/2020   PROT 6.4 10/20/2020   ALBUMIN 4.6 08/17/2016   CALCIUM 9.7 10/20/2020   Lab Results  Component Value Date   CHOL 184 10/20/2020   Lab Results  Component Value Date   HDL 76 10/20/2020   Lab Results  Component Value Date   LDLCALC 89 10/20/2020   Lab Results  Component Value Date   TRIG 97 10/20/2020   Lab Results  Component Value Date   CHOLHDL 2.4 10/20/2020   Lab Results  Component Value Date   HGBA1C 5.2 04/10/2019       Assessment & Plan:   1. Cough - DG Chest 2 View; Future  2. Bloody sputum - DG Chest 2 View; Future  Patient's presentation consistent with forceful coughing the past several days. No acute distress or red flag findings this visit. Will get chest x-ray to check for developing pneumonia. Encouraged patient to continue her antibiotic and OTC remedies for symptom management as discussed at last visit. We will let her know results and any changes to plan of care.   Follow-up if symptoms worsen or fail to improve. Discussed case with Dr. Henrietta Dine, NP

## 2020-12-31 NOTE — Progress Notes (Signed)
There were some signs of pneumonia on your chest x-ray. This explains your pink/blood-tinged sputum. Finish taking the z-pack antibiotic. Let us know if your symptoms worsen or do not improve over the next few days. - Note released to MyChart.

## 2021-01-03 ENCOUNTER — Ambulatory Visit (INDEPENDENT_AMBULATORY_CARE_PROVIDER_SITE_OTHER): Payer: Medicare Other | Admitting: Family Medicine

## 2021-01-03 ENCOUNTER — Encounter: Payer: Self-pay | Admitting: Family Medicine

## 2021-01-03 ENCOUNTER — Telehealth: Payer: Self-pay | Admitting: Sports Medicine

## 2021-01-03 ENCOUNTER — Other Ambulatory Visit: Payer: Self-pay

## 2021-01-03 VITALS — BP 124/80 | HR 75 | Wt 158.4 lb

## 2021-01-03 DIAGNOSIS — J0100 Acute maxillary sinusitis, unspecified: Secondary | ICD-10-CM | POA: Diagnosis not present

## 2021-01-03 DIAGNOSIS — J189 Pneumonia, unspecified organism: Secondary | ICD-10-CM | POA: Diagnosis not present

## 2021-01-03 DIAGNOSIS — M940 Chondrocostal junction syndrome [Tietze]: Secondary | ICD-10-CM

## 2021-01-03 MED ORDER — FLOVENT HFA 110 MCG/ACT IN AERO: 1.0000 | INHALATION_SPRAY | Freq: Two times a day (BID) | RESPIRATORY_TRACT | 12 refills | Status: DC

## 2021-01-03 MED ORDER — PREDNISONE 50 MG PO TABS: ORAL_TABLET | ORAL | 0 refills | Status: DC

## 2021-01-03 NOTE — Telephone Encounter (Signed)
Patient came in after her appointment and said she could save quite a bit of money if medicatiion below was sent to pharmacy below. She knows [facility] to [facility] but needs it sent to this other pharmacy. AM   fluticasone [facility] HFA) 110 MCG/ACT inhaler   Kristopher Oppenheim Address: [address], [place], [place], [place] [address] Phone: [phone]

## 2021-01-03 NOTE — Telephone Encounter (Signed)
Called [facility] Flovent is canceled out prescription is sent to [facility]

## 2021-01-03 NOTE — Telephone Encounter (Signed)
Patient notified of information below. AM

## 2021-01-03 NOTE — Patient Instructions (Signed)
It sounds like the antibiotic has helped the pneumonia. Let's try some oral prednisone and a steroid inhaler for daily use to see if this will help your ongoing shortness of breath and wheezing.  Let us know if this is not helping! Feel better soon!

## 2021-01-03 NOTE — Progress Notes (Signed)
Acute Office Visit  Subjective:    Patient ID: Heidi Mclaughlin, female    DOB: Dec 20, 1955, 65 y.o.   MRN: [id number]  No chief complaint on file.   HPI Patient is in today for pneumonia follow-up. She was seen in the office on 12/31/19 with chest x-ray concerning for community acquired pneumonia. She has completed z-pack.   States she has tested negative for COVID about 6 times over the past several months, but she thinks she may have had it around Thanksgiving because she was sick for about 4 weeks.   Today she reports she is still not feeling completely better after pneumonia diagnosis. She has been using her albuterol 2-3x/day for the past 4 days. She reports she is wheezing some, worse at night. Reports tightness with deep breaths and palpation. States she has not coughed up any more blood since she was here on Friday.   Lingering cough, not productive lately. Shortness of breath, superficial chest tightness/tenderness with coughing, deep breathing, and palpation. Still reporting mild maxillary sinus pressure. She denies fevers, sharp pain with deep breaths, dizziness, lightheadedness, fatigue, fevers, malaise. Feels like the antibiotic definitely helped, but breathing is still not back to baseline. She has been able to gradually start increasing her activity and has been doing yoga recently which has helped with deep breathing exercises.   Past Medical History:  Diagnosis Date  . Allergy   . Allergy-induced asthma   . Arthritis   . Hyperlipidemia   . Hypertension   . Irregular heart rhythm   . RLS (restless legs syndrome)     Past Surgical History:  Procedure Laterality Date  . ABDOMINAL HYSTERECTOMY  2003  . APPENDECTOMY  1982  . CHOLECYSTECTOMY  1982  . INCONTINENCE SURGERY  2003  . [place]  . TUBAL LIGATION  1992    Family History  Problem Relation Age of Onset  . Cancer Mother        lung  . Hypertension Father   . Heart attack  Father   . Prostate cancer Maternal Uncle   . Colon cancer Neg Hx   . Esophageal cancer Neg Hx   . Rectal cancer Neg Hx   . Stomach cancer Neg Hx     Social History   Socioeconomic History  . Marital status: Married    Spouse name: Not on file  . Number of children: Not on file  . Years of education: Not on file  . Highest education level: Not on file  Occupational History  . Not on file  Tobacco Use  . Smoking status: Former Smoker    Quit date: 11/13/1972    Years since quitting: 48.1  . Smokeless tobacco: Never Used  Vaping Use  . Vaping Use: Never used  Substance and Sexual Activity  . Alcohol use: Yes    Alcohol/week: 1.0 standard drink    Types: 1 Glasses of wine per week  . Drug use: No  . Sexual activity: Not Currently    Birth control/protection: Surgical  Other Topics Concern  . Not on file  Social History Narrative  . Not on file   Social Determinants of Health   Financial Resource Strain: Not on file  Food Insecurity: Not on file  Transportation Needs: Not on file  Physical Activity: Not on file  Stress: Not on file  Social Connections: Not on file  Intimate Partner Violence: Not on file    Outpatient Medications Prior to Visit  Medication Sig  Dispense Refill  . azithromycin (ZITHROMAX Z-PAK) 250 MG tablet Take 2 tablets (500 mg) on  Day 1,  followed by 1 tablet (250 mg) once daily on Days 2 through 5. 6 tablet 0  . cetirizine (ZYRTEC) 10 MG tablet Take 10 mg by mouth.    . doxycycline (VIBRA-TABS) 100 MG tablet Take 100 mg by mouth 2 (two) times daily.    . folic acid (FOLVITE) 1 MG tablet TAKE 1 TABLET BY MOUTH EVERY DAY 90 tablet 2  . hydrochlorothiazide (HYDRODIURIL) 25 MG tablet TAKE 1 TABLET BY MOUTH EVERY DAY 90 tablet 3  . mometasone (NASONEX) 50 MCG/ACT nasal spray One spray in each nostril twice a day, use left hand for right nostril, and right hand for left nostril.  Please dispense one bottle. 1 g 6  . montelukast (SINGULAIR) 10 MG tablet  TAKE 1 TABLET BY MOUTH EVERYDAY AT BEDTIME 90 tablet 1  . oxyCODONE-acetaminophen (PERCOCET/ROXICET) 5-325 MG tablet TAKE 1 TABLET BY MOUTH EVERY 6 HOURS AS NEEDED FOR MODERATE PAIN (4-6) OR SEVERE PAIN (7-10).    Marland Kitchen rOPINIRole (REQUIP) 1 MG tablet TAKE 1 TABLET (1 MG TOTAL) BY MOUTH AT BEDTIME. 90 tablet 1  . simvastatin (ZOCOR) 40 MG tablet TAKE 1 TABLET BY MOUTH EVERY DAY 90 tablet 3  . valACYclovir (VALTREX) 500 MG tablet TAKE 1 TABLET (500 MG TOTAL) BY MOUTH AS NEEDED. 90 tablet 0  . VENTOLIN HFA 108 (90 Base) MCG/ACT inhaler TAKE 2 PUFFS BY MOUTH EVERY 6 HOURS AS NEEDED FOR WHEEZE OR SHORTNESS OF BREATH 18 each 11   No facility-administered medications prior to visit.    Allergies  Allergen Reactions  . Amoxicillin Anaphylaxis  . Penicillins Anaphylaxis  . Pentazocine-Naloxone Hcl Anxiety and Other (See Comments)    insomnia insomnia   . Poison Ivy Extract [Poison Ivy Extract] Rash    Review of Systems All review of systems negative except what is listed in the HPI     Objective:    Physical Exam Vitals reviewed.  Constitutional:      Appearance: Normal appearance.  HENT:     Nose: Congestion present.     Right Sinus: Maxillary sinus tenderness present.     Left Sinus: Maxillary sinus tenderness present.  Cardiovascular:     Rate and Rhythm: Normal rate and regular rhythm.     Pulses: Normal pulses.     Heart sounds: Normal heart sounds.  Pulmonary:     Effort: Pulmonary effort is normal.     Breath sounds: Normal breath sounds.     Comments: Bases slightly diminished but clear Skin:    General: Skin is warm and dry.  Neurological:     General: No focal deficit present.     Mental Status: She is alert and oriented to person, place, and time. Mental status is at baseline.  Psychiatric:        Mood and Affect: Mood normal.        Behavior: Behavior normal.        Thought Content: Thought content normal.        Judgment: Judgment normal.     There were no  vitals taken for this visit. Wt Readings from Last 3 Encounters:  12/30/20 157 lb 1.3 oz (71.3 kg)  10/06/20 153 lb (69.4 kg)  06/09/20 158 lb 3.2 oz (71.8 kg)    Health Maintenance Due  Topic Date Due  . PNA vac Low Risk Adult (2 of 2 - PCV13) 10/14/2020  There are no preventive care reminders to display for this patient.   Lab Results  Component Value Date   TSH 1.40 10/20/2020   Lab Results  Component Value Date   WBC 4.3 10/20/2020   HGB 14.8 10/20/2020   HCT 43.5 10/20/2020   MCV 87.0 10/20/2020   PLT 313 10/20/2020   Lab Results  Component Value Date   NA 137 10/20/2020   K 4.3 10/20/2020   CO2 28 10/20/2020   GLUCOSE 93 10/20/2020   BUN 18 10/20/2020   CREATININE 0.92 10/20/2020   BILITOT 0.4 10/20/2020   ALKPHOS 46 08/17/2016   AST 20 10/20/2020   ALT 21 10/20/2020   PROT 6.4 10/20/2020   ALBUMIN 4.6 08/17/2016   CALCIUM 9.7 10/20/2020   Lab Results  Component Value Date   CHOL 184 10/20/2020   Lab Results  Component Value Date   HDL 76 10/20/2020   Lab Results  Component Value Date   LDLCALC 89 10/20/2020   Lab Results  Component Value Date   TRIG 97 10/20/2020   Lab Results  Component Value Date   CHOLHDL 2.4 10/20/2020   Lab Results  Component Value Date   HGBA1C 5.2 04/10/2019       Assessment & Plan:   1. Pneumonia of right lung due to infectious organism, unspecified part of lung - predniSONE (DELTASONE) 50 MG tablet; One tab PO daily for 5 days.  Dispense: 5 tablet; Refill: 0 - fluticasone (FLOVENT HFA) 110 MCG/ACT inhaler; Inhale 1 puff into the lungs in the morning and at bedtime.  Dispense: 1 each; Refill: 12  2. Costochondritis - predniSONE (DELTASONE) 50 MG tablet; One tab PO daily for 5 days.  Dispense: 5 tablet; Refill: 0  3. Acute maxillary sinusitis, recurrence not specified - predniSONE (DELTASONE) 50 MG tablet; One tab PO daily for 5 days.  Dispense: 5 tablet; Refill: 0  Patient recovering from community  acquired pneumonia and sinusitis with chronic allergies. Her lungs sound much better this visit, though still slightly diminished, but no wheezing or adventitious sounds. Her coughing is gradually improving, no fevers or recent sputum production. Chest tender to palpation and consistent with costochondritis following weeks of coughing related to allergies and now pneumonia. We will try a prednisone burst and Flovent inhaler for the ongoing wheezing, sinusitis, and costochondritis. Patient encouraged to continue pulmonary hygiene practices and keep with her current allergy regimen. She was educated that it can take several weeks for her to feel completely back to normal, especially since she is in the middle of her worst allergy season. States she has a pulse oximeter and has been checking occasionally with her pulse ox always upper 90's. Educated on signs and symptoms requiring urgent evaluation. Follow-up as needed or if symptoms worsen or fail to improve.     Terrilyn Saver, NP

## 2021-01-09 ENCOUNTER — Other Ambulatory Visit: Payer: Self-pay | Admitting: Sports Medicine

## 2021-01-20 ENCOUNTER — Encounter: Payer: Self-pay | Admitting: Sports Medicine

## 2021-01-24 ENCOUNTER — Encounter: Payer: Self-pay | Admitting: Sports Medicine

## 2021-01-24 ENCOUNTER — Ambulatory Visit (INDEPENDENT_AMBULATORY_CARE_PROVIDER_SITE_OTHER): Payer: Medicare Other

## 2021-01-24 ENCOUNTER — Ambulatory Visit (INDEPENDENT_AMBULATORY_CARE_PROVIDER_SITE_OTHER): Payer: Medicare Other | Admitting: Sports Medicine

## 2021-01-24 ENCOUNTER — Other Ambulatory Visit: Payer: Self-pay

## 2021-01-24 DIAGNOSIS — H43391 Other vitreous opacities, right eye: Secondary | ICD-10-CM | POA: Diagnosis not present

## 2021-01-24 DIAGNOSIS — M25551 Pain in right hip: Secondary | ICD-10-CM

## 2021-01-24 DIAGNOSIS — H0100A Unspecified blepharitis right eye, upper and lower eyelids: Secondary | ICD-10-CM | POA: Diagnosis not present

## 2021-01-24 DIAGNOSIS — H52203 Unspecified astigmatism, bilateral: Secondary | ICD-10-CM | POA: Diagnosis not present

## 2021-01-24 DIAGNOSIS — M1611 Unilateral primary osteoarthritis, right hip: Secondary | ICD-10-CM | POA: Diagnosis not present

## 2021-01-24 DIAGNOSIS — H04123 Dry eye syndrome of bilateral lacrimal glands: Secondary | ICD-10-CM | POA: Diagnosis not present

## 2021-01-24 DIAGNOSIS — H524 Presbyopia: Secondary | ICD-10-CM | POA: Diagnosis not present

## 2021-01-24 DIAGNOSIS — H43812 Vitreous degeneration, left eye: Secondary | ICD-10-CM | POA: Diagnosis not present

## 2021-01-24 DIAGNOSIS — H2513 Age-related nuclear cataract, bilateral: Secondary | ICD-10-CM | POA: Diagnosis not present

## 2021-01-24 DIAGNOSIS — H0100B Unspecified blepharitis left eye, upper and lower eyelids: Secondary | ICD-10-CM | POA: Diagnosis not present

## 2021-01-24 NOTE — Assessment & Plan Note (Signed)
This is a pleasant 65 year old female, she has had a several week history of pain in her right hip, groin, worse with weightbearing. Exam is for the most part benign with exception of a positive FABER sign. We will get some x-rays and start some hip mobilization exercises before considering injection or MRI.  Of note her community-acquired pneumonia has resolved.

## 2021-01-24 NOTE — Progress Notes (Signed)
    Procedures performed today:    None.  Independent interpretation of notes and tests performed by another provider:   None.  Brief History, Exam, Impression, and Recommendations:    Right hip pain This is a pleasant 65 year old female, she has had a several week history of pain in her right hip, groin, worse with weightbearing. Exam is for the most part benign with exception of a positive FABER sign. We will get some x-rays and start some hip mobilization exercises before considering injection or MRI.  Of note her community-acquired pneumonia has resolved.    ___________________________________________ Gwen Her. Dianah Field, M.D., ABFM., CAQSM. Primary Care and [place] Instructor of [place] [facility] [employer] [facility]

## 2021-02-21 ENCOUNTER — Other Ambulatory Visit: Payer: Self-pay

## 2021-02-21 ENCOUNTER — Ambulatory Visit (INDEPENDENT_AMBULATORY_CARE_PROVIDER_SITE_OTHER): Payer: Medicare Other | Admitting: Sports Medicine

## 2021-02-21 VITALS — BP 133/73 | HR 69 | Resp 16 | Ht 62.0 in | Wt 153.0 lb

## 2021-02-21 DIAGNOSIS — Z23 Encounter for immunization: Secondary | ICD-10-CM

## 2021-02-21 DIAGNOSIS — Z0000 Encounter for general adult medical examination without abnormal findings: Secondary | ICD-10-CM | POA: Diagnosis not present

## 2021-02-21 NOTE — Progress Notes (Signed)
MEDICARE ANNUAL WELLNESS VISIT  02/21/2021  Subjective:  Heidi Mclaughlin is a 65 y.o. female patient of Thekkekandam, Gwen Her, MD who had a Medicare Annual Wellness Visit today. Alawna is Working full time and lives with their spouse. she has 2 children. she reports that she is socially active and does interact with friends/family regularly. she is moderately physically active and enjoys exercising, reading and traveling.  Patient Care Team: Silverio Decamp, MD as PCP - General (Sports Medicine)  Advanced Directives 02/21/2021 12/03/2017 02/04/2016 10/12/2015 04/28/2014 03/24/2014  Does Patient Have a Medical Advance Directive? Yes Yes Yes Yes Patient has advance directive, copy not in chart Patient has advance directive, copy not in chart;Patient would not like information  Type of Advance Directive [place];Living will [place];Living will - - Living will;Healthcare Power of Attorney -  Does patient want to make changes to medical advance directive? No - Patient declined - - - - -  Copy of August in Chart? No - copy requested Yes - Yes [facility] Utilization Over the Past 12 Months: # of hospitalizations or ER visits: 0 # of surgeries: 1  Review of Systems    Patient reports that her overall health is worse when compared to last year.  Review of Systems: History obtained from chart review and the patient  All other systems negative.  Pain Assessment Pain : No/denies pain     Current Medications & Allergies (verified) Allergies as of 02/21/2021      Reactions   Amoxicillin Anaphylaxis   Penicillins Anaphylaxis   Pentazocine-naloxone Hcl Anxiety, Other (See Comments)   insomnia insomnia   Poison Ivy Extract [poison Ivy Extract] Rash      Medication List       Accurate as of February 21, 2021  2:01 PM. If you have any questions, ask your nurse or doctor.        cetirizine 10 MG tablet Commonly known as:  ZYRTEC Take 10 mg by mouth.   Flovent HFA 110 MCG/ACT inhaler Generic drug: fluticasone Inhale 1 puff into the lungs in the morning and at bedtime.   folic acid 1 MG tablet Commonly known as: FOLVITE TAKE 1 TABLET BY MOUTH EVERY DAY   hydrochlorothiazide 25 MG tablet Commonly known as: HYDRODIURIL TAKE 1 TABLET BY MOUTH EVERY DAY   mometasone 50 MCG/ACT nasal spray Commonly known as: NASONEX One spray in each nostril twice a day, use left hand for right nostril, and right hand for left nostril.  Please dispense one bottle.   montelukast 10 MG tablet Commonly known as: SINGULAIR TAKE 1 TABLET BY MOUTH EVERYDAY AT BEDTIME   rOPINIRole 1 MG tablet Commonly known as: REQUIP TAKE 1 TABLET (1 MG TOTAL) BY MOUTH AT BEDTIME.   simvastatin 40 MG tablet Commonly known as: ZOCOR TAKE 1 TABLET BY MOUTH EVERY DAY   Systane 0.4-0.3 % Soln Generic drug: Polyethyl Glycol-Propyl Glycol   valACYclovir 500 MG tablet Commonly known as: VALTREX TAKE 1 TABLET (500 MG TOTAL) BY MOUTH AS NEEDED.   Ventolin HFA 108 (90 Base) MCG/ACT inhaler Generic drug: albuterol TAKE 2 PUFFS BY MOUTH EVERY 6 HOURS AS NEEDED FOR WHEEZE OR SHORTNESS OF BREATH       History (reviewed): Past Medical History:  Diagnosis Date  . Allergy   . Allergy-induced asthma   . Arthritis   . Hyperlipidemia   . Hypertension   . Irregular heart rhythm   .  RLS (restless legs syndrome)    Past Surgical History:  Procedure Laterality Date  . ABDOMINAL HYSTERECTOMY  2003  . APPENDECTOMY  1982  . CHOLECYSTECTOMY  1982  . INCONTINENCE SURGERY  2003  . [place]  . TUBAL LIGATION  1992   Family History  Problem Relation Age of Onset  . Cancer Mother        lung  . Hypertension Father   . Heart attack Father   . Pancreatic cancer Paternal Uncle   . Colon cancer Neg Hx   . Esophageal cancer Neg Hx   . Rectal cancer Neg Hx   . Stomach cancer Neg Hx    Social History    Socioeconomic History  . Marital status: Married    Spouse name: Richardson Landry  . Number of children: 2  . Years of education: 66  . Highest education level: Bachelor's degree (e.g., BA, AB, BS)  Occupational History    Comment: works full time  Tobacco Use  . Smoking status: Former Smoker    Quit date: 11/13/1972    Years since quitting: 48.3  . Smokeless tobacco: Never Used  Vaping Use  . Vaping Use: Never used  Substance and Sexual Activity  . Alcohol use: Yes    Alcohol/week: 1.0 standard drink    Types: 1 Glasses of wine per week    Comment: once a week  . Drug use: No  . Sexual activity: Not Currently    Birth control/protection: Surgical  Other Topics Concern  . Not on file  Social History Narrative   Works full-time. Lives with her husband. Likes to exercise, read and travel.    Social Determinants of Health   Financial Resource Strain: Low Risk   . Difficulty of Paying Living Expenses: Not hard at all  Food Insecurity: No Food Insecurity  . Worried About Charity fundraiser in the Last Year: Never true  . Ran Out of Food in the Last Year: Never true  Transportation Needs: No Transportation Needs  . Lack of Transportation (Medical): No  . Lack of Transportation (Non-Medical): No  Physical Activity: Sufficiently Active  . Days of Exercise per Week: 4 days  . Minutes of Exercise per Session: 60 min  Stress: No Stress Concern Present  . Feeling of Stress : Not at all  Social Connections: Socially Integrated  . Frequency of Communication with Friends and Family: More than three times a week  . Frequency of Social Gatherings with Friends and Family: Twice a week  . Attends Religious Services: More than 4 times per year  . Active Member of Clubs or Organizations: Yes  . Attends Archivist Meetings: More than 4 times per year  . Marital Status: Married    Activities of Daily Living In your present state of health, do you have any difficulty performing the  following activities: 02/21/2021  Hearing? Y  Comment noticed that her hearing in both ears is decreasing. She is planning to make the hearing aids.  Vision? N  Difficulty concentrating or making decisions? N  Walking or climbing stairs? N  Dressing or bathing? N  Doing errands, shopping? N  Preparing Food and eating ? N  Using the Toilet? N  In the past six months, have you accidently leaked urine? Y  Comment chronic issue/ sometimes she has had incontinence.  Do you have problems with loss of bowel control? N  Managing your Medications? N  Managing your Finances? N  Housekeeping  or managing your Housekeeping? N  Some recent data might be hidden    Patient Education/Literacy How often do you need to have someone help you when you read instructions, pamphlets, or other written materials from your doctor or pharmacy?: 1 - Never What is the last grade level you completed in school?: Bachelor's degree  Exercise Current Exercise Habits: Home exercise routine, Type of exercise: strength training/weights;walking;yoga, Time (Minutes): 60, Frequency (Times/Week): 4, Weekly Exercise (Minutes/Week): 240, Intensity: Moderate, Exercise limited by: orthopedic condition(s)  Diet Patient reports consuming 2-2.5 meals a day and 0 snack(s) a day Patient reports that her primary diet is: Regular Patient reports that she does have regular access to food.   Depression Screen PHQ 2/9 Scores 02/21/2021 01/24/2021 08/30/2017  PHQ - 2 Score 0 0 0     Fall Risk Fall Risk  02/21/2021 10/12/2020  Falls in the past year? 0 0  Number falls in past yr: 0 0  Injury with Fall? 0 0  Risk for fall due to : No Fall Risks -  Follow up Falls evaluation completed -     Objective:   BP 133/73 (BP Location: Right Arm, Patient Position: Sitting, Cuff Size: Normal)   Pulse 69   Resp 16   Ht 5\' 2"  (1.575 m)   Wt 153 lb (69.4 kg)   SpO2 97%   BMI 27.98 kg/m   Last Weight  Most recent update: 02/21/2021  1:06  PM   Weight  69.4 kg (153 lb)            Body mass index is 27.98 kg/m.  Hearing/Vision  . Rainey did not have difficulty with hearing/understanding during the face-to-face interview . Rikia did not have difficulty with her vision during the face-to-face interview . Reports that she has had a formal eye exam by an eye care professional within the past year . Reports that she has not had a formal hearing evaluation within the past year  Cognitive Function: 6CIT Screen 02/21/2021  What Year? 0 points  What month? 0 points  What time? 0 points  Count back from 20 0 points  Months in reverse 0 points  Repeat phrase 2 points  Total Score 2    Normal Cognitive Function Screening: Yes (Normal:0-7, Significant for Dysfunction: >8)  Immunization & Health Maintenance Record Immunization History  Administered Date(s) Administered  . Fluad Quad(high Dose 65+) 09/18/2019, 07/23/2020  . Influenza, Seasonal, Injecte, Preservative Fre 09/05/2012, 08/17/2015, 08/31/2016, 08/30/2017  . Influenza,inj,Quad PF,6+ Mos 08/17/2015, 08/31/2016, 08/30/2017, 09/12/2018  . Influenza-Unspecified 09/12/2018  . PFIZER(Purple Top)SARS-COV-2 Vaccination 12/05/2019, 12/26/2019, 08/13/2020  . PNEUMOCOCCAL CONJUGATE-20 02/21/2021  . Pneumococcal Polysaccharide-23 10/15/2019    Health Maintenance  Topic Date Due  . INFLUENZA VACCINE  06/13/2021  . PNA vac Low Risk Adult (2 of 2 - PCV13) 02/21/2022  . MAMMOGRAM  10/19/2022  . COLONOSCOPY (Pts 45-9yrs Insurance coverage will need to be confirmed)  05/08/2024  . TETANUS/TDAP  08/07/2024  . DEXA SCAN  Completed  . COVID-19 Vaccine  Completed  . Hepatitis C Screening  Completed  . HPV VACCINES  Aged Out       Assessment  This is a routine wellness examination for [employer].  Health Maintenance: Due or Overdue There are no preventive care reminders to display for this patient.  [name] does not need a referral for [employer] Assistance: Care  Management:   no Social Work:    no Prescription Assistance:  no Nutrition/Diabetes Education:  no  Plan:  Personalized Goals Goals Addressed              This Visit's Progress   .  Patient Stated (pt-stated)        02/21/2021 AWV Goal: Exercise for General Health   Patient will verbalize understanding of the benefits of increased physical activity:  Exercising regularly is important. It will improve your overall fitness, flexibility, and endurance.  Regular exercise also will improve your overall health. It can help you control your weight, reduce stress, and improve your bone density.  Over the next year, patient will increase physical activity as tolerated with a goal of at least 150 minutes of moderate physical activity per week.   You can tell that you are exercising at a moderate intensity if your heart starts beating faster and you start breathing faster but can still hold a conversation.  Moderate-intensity exercise ideas include:  Walking 1 mile (1.6 km) in about 15 minutes  Biking  Hiking  Golfing  Dancing  Water aerobics  Patient will verbalize understanding of everyday activities that increase physical activity by providing examples like the following: ? Yard work, such as: ? Pushing a Conservation officer, nature ? Raking and bagging leaves ? Washing your car ? Pushing a stroller ? Shoveling snow ? Gardening ? Washing windows or floors  Patient will be able to explain general safety guidelines for exercising:   Before you start a new exercise program, talk with your health care provider.  Do not exercise so much that you hurt yourself, feel dizzy, or get very short of breath.  Wear comfortable clothes and wear shoes with good support.  Drink plenty of water while you exercise to prevent dehydration or heat stroke.  Work out until your breathing and your heartbeat get faster.       Personalized Health Maintenance & Screening Recommendations  Pneumococcal  vaccine  Shingrix  Lung Cancer Screening Recommended: no (Low Dose CT Chest recommended if Age 53-80 years, 30 pack-year currently smoking OR have quit w/in past 15 years) Hepatitis C Screening recommended: no HIV Screening recommended: no  Advanced Directives: Written information was not given per the patient's request.  Referrals & Orders Orders Placed This Encounter  Procedures  . Pneumococcal conjugate vaccine 20-valent (Prevnar 20)    Follow-up Plan . Follow-up with Silverio Decamp, MD as planned . Schedule your shingrix vaccine at your pharmacy. . Medicare wellness in one year.   I have personally reviewed and noted the following in the patient's chart:   . Medical and social history . Use of alcohol, tobacco or illicit drugs  . Current medications and supplements . Functional ability and status . Nutritional status . Physical activity . Advanced directives . List of other physicians . Hospitalizations, surgeries, and ER visits in previous 12 months . Vitals . Screenings to include cognitive, depression, and falls . Referrals and appointments  In addition, I have reviewed and discussed with patient certain preventive protocols, quality metrics, and best practice recommendations. A written personalized care plan for preventive services as well as general preventive health recommendations were provided to patient.     Tinnie Gens, RN  02/21/2021

## 2021-02-21 NOTE — Patient Instructions (Addendum)
[name] Maintenance Summary and Written Plan of Care  Ms. Heidi Mclaughlin ,  Thank you for allowing me to perform your Medicare Annual Wellness Visit and for your ongoing commitment to your health.   Health Maintenance & Immunization History Health Maintenance  Topic Date Due  . INFLUENZA VACCINE  06/13/2021  . PNA vac Low Risk Adult (2 of 2 - PCV13) 02/21/2022  . MAMMOGRAM  10/19/2022  . COLONOSCOPY (Pts 45-66yrs Insurance coverage will need to be confirmed)  05/08/2024  . TETANUS/TDAP  08/07/2024  . DEXA SCAN  Completed  . COVID-19 Vaccine  Completed  . Hepatitis C Screening  Completed  . HPV VACCINES  Aged Out   Immunization History  Administered Date(s) Administered  . Fluad Quad(high Dose 65+) 09/18/2019, 07/23/2020  . Influenza, Seasonal, Injecte, Preservative Fre 09/05/2012, 08/17/2015, 08/31/2016, 08/30/2017  . Influenza,inj,Quad PF,6+ Mos 08/17/2015, 08/31/2016, 08/30/2017, 09/12/2018  . Influenza-Unspecified 09/12/2018  . PFIZER(Purple Top)SARS-COV-2 Vaccination 12/05/2019, 12/26/2019, 08/13/2020  . PNEUMOCOCCAL CONJUGATE-20 02/21/2021  . Pneumococcal Polysaccharide-23 10/15/2019    These are the patient goals that we discussed: Goals Addressed              This Visit's Progress   .  Patient Stated (pt-stated)        02/21/2021 AWV Goal: Exercise for General Health   Patient will verbalize understanding of the benefits of increased physical activity:  Exercising regularly is important. It will improve your overall fitness, flexibility, and endurance.  Regular exercise also will improve your overall health. It can help you control your weight, reduce stress, and improve your bone density.  Over the next year, patient will increase physical activity as tolerated with a goal of at least 150 minutes of moderate physical activity per week.   You can tell that you are exercising at a moderate intensity if your heart starts beating  faster and you start breathing faster but can still hold a conversation.  Moderate-intensity exercise ideas include:  Walking 1 mile (1.6 km) in about 15 minutes  Biking  Hiking  Golfing  Dancing  Water aerobics  Patient will verbalize understanding of everyday activities that increase physical activity by providing examples like the following: ? Yard work, such as: ? Pushing a Conservation officer, nature ? Raking and bagging leaves ? Washing your car ? Pushing a stroller ? Shoveling snow ? Gardening ? Washing windows or floors  Patient will be able to explain general safety guidelines for exercising:   Before you start a new exercise program, talk with your health care provider.  Do not exercise so much that you hurt yourself, feel dizzy, or get very short of breath.  Wear comfortable clothes and wear shoes with good support.  Drink plenty of water while you exercise to prevent dehydration or heat stroke.  Work out until your breathing and your heartbeat get faster.         This is a list of Health Maintenance Items that are overdue or due now: There are no preventive care reminders to display for this patient.   Orders/Referrals Placed Today: Orders Placed This Encounter  Procedures  . Pneumococcal conjugate vaccine 20-valent (Prevnar 20)   (Contact our referral department at [phone] if you have not spoken with someone about your referral appointment within the next 5 days)    Follow-up Plan . Follow-up with Silverio Decamp, MD as planned . Schedule your shingrix vaccine at your pharmacy. . Medicare wellness in one year.     Health  Maintenance, Female Adopting a healthy lifestyle and getting preventive care are important in promoting health and wellness. Ask your health care provider about:  The right schedule for you to have regular tests and exams.  Things you can do on your own to prevent diseases and keep yourself healthy. What should I know about  diet, weight, and exercise? Eat a healthy diet  Eat a diet that includes plenty of vegetables, fruits, low-fat dairy products, and lean protein.  Do not eat a lot of foods that are high in solid fats, added sugars, or sodium.   Maintain a healthy weight Body mass index (BMI) is used to identify weight problems. It estimates body fat based on height and weight. Your health care provider can help determine your BMI and help you achieve or maintain a healthy weight. Get regular exercise Get regular exercise. This is one of the most important things you can do for your health. Most adults should:  Exercise for at least 150 minutes each week. The exercise should increase your heart rate and make you sweat (moderate-intensity exercise).  Do strengthening exercises at least twice a week. This is in addition to the moderate-intensity exercise.  Spend less time sitting. Even light physical activity can be beneficial. Watch cholesterol and blood lipids Have your blood tested for lipids and cholesterol at 67 years of age, then have this test every 5 years. Have your cholesterol levels checked more often if:  Your lipid or cholesterol levels are high.  You are older than 67 years of age.  You are at high risk for heart disease. What should I know about cancer screening? Depending on your health history and family history, you may need to have cancer screening at various ages. This may include screening for:  Breast cancer.  Cervical cancer.  Colorectal cancer.  Skin cancer.  Lung cancer. What should I know about heart disease, diabetes, and high blood pressure? Blood pressure and heart disease  High blood pressure causes heart disease and increases the risk of stroke. This is more likely to develop in people who have high blood pressure readings, are of African descent, or are overweight.  Have your blood pressure checked: ? Every 3-5 years if you are 44-65 years of age. ? Every year  if you are 73 years old or older. Diabetes Have regular diabetes screenings. This checks your fasting blood sugar level. Have the screening done:  Once every three years after age 60 if you are at a normal weight and have a low risk for diabetes.  More often and at a younger age if you are overweight or have a high risk for diabetes. What should I know about preventing infection? Hepatitis B If you have a higher risk for hepatitis B, you should be screened for this virus. Talk with your health care provider to find out if you are at risk for hepatitis B infection. Hepatitis C Testing is recommended for:  Everyone born from [birth year] through 1967.  Anyone with known risk factors for hepatitis C. Sexually transmitted infections (STIs)  Get screened for STIs, including gonorrhea and chlamydia, if: ? You are sexually active and are younger than 67 years of age. ? You are older than 67 years of age and your health care provider tells you that you are at risk for this type of infection. ? Your sexual activity has changed since you were last screened, and you are at increased risk for chlamydia or gonorrhea. Ask your health care provider  if you are at risk.  Ask your health care provider about whether you are at high risk for HIV. Your health care provider may recommend a prescription medicine to help prevent HIV infection. If you choose to take medicine to prevent HIV, you should first get tested for HIV. You should then be tested every 3 months for as long as you are taking the medicine. Pregnancy  If you are about to stop having your period (premenopausal) and you may become pregnant, seek counseling before you get pregnant.  Take 400 to 800 micrograms (mcg) of folic acid every day if you become pregnant.  Ask for birth control (contraception) if you want to prevent pregnancy. Osteoporosis and menopause Osteoporosis is a disease in which the bones lose minerals and strength with aging. This  can result in bone fractures. If you are 43 years old or older, or if you are at risk for osteoporosis and fractures, ask your health care provider if you should:  Be screened for bone loss.  Take a calcium or vitamin D supplement to lower your risk of fractures.  Be given hormone replacement therapy (HRT) to treat symptoms of menopause. Follow these instructions at home: Lifestyle  Do not use any products that contain nicotine or tobacco, such as cigarettes, e-cigarettes, and chewing tobacco. If you need help quitting, ask your health care provider.  Do not use street drugs.  Do not share needles.  Ask your health care provider for help if you need support or information about quitting drugs. Alcohol use  Do not drink alcohol if: ? Your health care provider tells you not to drink. ? You are pregnant, may be pregnant, or are planning to become pregnant.  If you drink alcohol: ? Limit how much you use to 0-1 drink a day. ? Limit intake if you are breastfeeding.  Be aware of how much alcohol is in your drink. In the [place] one drink equals one 12 oz bottle of beer (355 mL), one 5 oz glass of wine (148 mL), or one 1 oz glass of hard liquor (44 mL). General instructions  Schedule regular health, dental, and eye exams.  Stay current with your vaccines.  Tell your health care provider if: ? You often feel depressed. ? You have ever been abused or do not feel safe at home. Summary  Adopting a healthy lifestyle and getting preventive care are important in promoting health and wellness.  Follow your health care provider's instructions about healthy diet, exercising, and getting tested or screened for diseases.  Follow your health care provider's instructions on monitoring your cholesterol and blood pressure. This information is not intended to replace advice given to you by your health care provider. Make sure you discuss any questions you have with your health care  provider. Document Revised: 10/23/2018 Document Reviewed: 10/23/2018 Elsevier Patient Education  2021 [employer].

## 2021-03-07 ENCOUNTER — Other Ambulatory Visit: Payer: Self-pay | Admitting: Sports Medicine

## 2021-03-07 ENCOUNTER — Ambulatory Visit (INDEPENDENT_AMBULATORY_CARE_PROVIDER_SITE_OTHER): Payer: Medicare Other | Admitting: Sports Medicine

## 2021-03-07 ENCOUNTER — Ambulatory Visit (INDEPENDENT_AMBULATORY_CARE_PROVIDER_SITE_OTHER): Payer: Medicare Other

## 2021-03-07 ENCOUNTER — Other Ambulatory Visit: Payer: Self-pay

## 2021-03-07 DIAGNOSIS — M4319 Spondylolisthesis, multiple sites in spine: Secondary | ICD-10-CM | POA: Diagnosis not present

## 2021-03-07 DIAGNOSIS — M4316 Spondylolisthesis, lumbar region: Secondary | ICD-10-CM

## 2021-03-07 DIAGNOSIS — M47816 Spondylosis without myelopathy or radiculopathy, lumbar region: Secondary | ICD-10-CM | POA: Diagnosis not present

## 2021-03-07 DIAGNOSIS — J189 Pneumonia, unspecified organism: Secondary | ICD-10-CM

## 2021-03-07 DIAGNOSIS — C3411 Malignant neoplasm of upper lobe, right bronchus or lung: Secondary | ICD-10-CM | POA: Insufficient documentation

## 2021-03-07 DIAGNOSIS — R918 Other nonspecific abnormal finding of lung field: Secondary | ICD-10-CM | POA: Diagnosis not present

## 2021-03-07 MED ORDER — PREDNISONE 10 MG (48) PO TBPK: ORAL_TABLET | Freq: Every day | ORAL | 0 refills | Status: DC

## 2021-03-07 MED ORDER — MOXIFLOXACIN HCL 400 MG PO TABS: 400.0000 mg | ORAL_TABLET | Freq: Every day | ORAL | 0 refills | Status: DC

## 2021-03-07 NOTE — Assessment & Plan Note (Addendum)
This is a 65 year old female, she has a history of a right upper lobe pneumonia, treated appropriately with azithromycin. Unfortunately she is having increasing cough, as well as a course sensation when breathing. On exam she has crackles in her left upper lobe. Adding a chest x-ray, we will use moxifloxacin this time, as well as a 12-day prednisone taper. Continue albuterol as needed and Flovent daily, we will switch to Symbicort if needed.  Unfortunately it looks like there is a patchy opacity in the right upper lobe that is persistent and even worsened, because of this we will need a chest CT with IV contrast to ensure there is no mass causing obstructive pneumonia.  Update: Avelox not covered, switching to Levaquin.  Update: CT scan reviewed.  Spoke with patient and gave her the news that there is likely a lung cancer.  I will go ahead and get her referred to oncology and potentially pulmonology to see if they can do a transbronchial biopsy.

## 2021-03-07 NOTE — Assessment & Plan Note (Addendum)
Heidi Mclaughlin has a history of left-sided L5 and S1 radiculopathy that resolved with formal PT, she did have lumbar spondylolisthesis per Having a recurrence of pain in the right hip this time with radiation down the right leg in an L5 distribution. Pain is in the right buttock. Updating x-rays, prednisone as above for her breathing, and formal physical therapy including traction, return to see me in 6 weeks for this.

## 2021-03-07 NOTE — Progress Notes (Addendum)
    Procedures performed today:    None.  Independent interpretation of notes and tests performed by another provider:   None.  Brief History, Exam, Impression, and Recommendations:    Mass of upper lobe of right lung This is a 65 year old female, she has a history of a right upper lobe pneumonia, treated appropriately with azithromycin. Unfortunately she is having increasing cough, as well as a course sensation when breathing. On exam she has crackles in her left upper lobe. Adding a chest x-ray, we will use moxifloxacin this time, as well as a 12-day prednisone taper. Continue albuterol as needed and Flovent daily, we will switch to Symbicort if needed.  Unfortunately it looks like there is a patchy opacity in the right upper lobe that is persistent and even worsened, because of this we will need a chest CT with IV contrast to ensure there is no mass causing obstructive pneumonia.  Update: Avelox not covered, switching to Levaquin.  Update: CT scan reviewed.  Spoke with patient and gave her the news that there is likely a lung cancer.  I will go ahead and get her referred to oncology and potentially pulmonology to see if they can do a transbronchial biopsy.  Spondylolisthesis of lumbar region Breindel has a history of left-sided L5 and S1 radiculopathy that resolved with formal PT, she did have lumbar spondylolisthesis per Having a recurrence of pain in the right hip this time with radiation down the right leg in an L5 distribution. Pain is in the right buttock. Updating x-rays, prednisone as above for her breathing, and formal physical therapy including traction, return to see me in 6 weeks for this.    ___________________________________________ Gwen Her. Dianah Field, M.D., ABFM., CAQSM. Primary Care and [place] Instructor of [place] [facility]

## 2021-03-07 NOTE — Addendum Note (Signed)
Addended by: Silverio Decamp on: 03/07/2021 01:39 PM   Modules accepted: Orders

## 2021-03-10 ENCOUNTER — Other Ambulatory Visit: Payer: Self-pay

## 2021-03-10 ENCOUNTER — Encounter: Payer: Self-pay | Admitting: Rehabilitative and Restorative Service Providers"

## 2021-03-10 ENCOUNTER — Ambulatory Visit (INDEPENDENT_AMBULATORY_CARE_PROVIDER_SITE_OTHER): Payer: Medicare Other | Admitting: Rehabilitative and Restorative Service Providers"

## 2021-03-10 DIAGNOSIS — R29898 Other symptoms and signs involving the musculoskeletal system: Secondary | ICD-10-CM | POA: Diagnosis not present

## 2021-03-10 DIAGNOSIS — M5441 Lumbago with sciatica, right side: Secondary | ICD-10-CM

## 2021-03-10 NOTE — Therapy (Signed)
[place], [place], [address] Phone: [phone]   Fax:  [phone]  Physical Therapy Evaluation  Patient Details  Name: Heidi Mclaughlin MRN: [id number] Date of Birth: Apr 20, 1956 Referring Provider (PT): Dr Dianah Field   Encounter Date: 03/10/2021   PT End of Session - 03/10/21 1016    Visit Number 1    Number of Visits 12    Date for PT Re-Evaluation 04/21/21    PT Start Time 0933    PT Stop Time 1018    PT Time Calculation (min) 45 min    Activity Tolerance Patient tolerated treatment well           Past Medical History:  Diagnosis Date  . Allergy   . Allergy-induced asthma   . Arthritis   . Hyperlipidemia   . Hypertension   . Irregular heart rhythm   . RLS (restless legs syndrome)     Past Surgical History:  Procedure Laterality Date  . ABDOMINAL HYSTERECTOMY  2003  . APPENDECTOMY  1982  . CHOLECYSTECTOMY  1982  . INCONTINENCE SURGERY  2003  . [place]  . TUBAL LIGATION  1992    There were no vitals filed for this visit.    Subjective Assessment - 03/10/21 0932    Subjective Patient reports that she started having LBP and pain into the Rt LE after walking. She has some discomfort with walking up steps and with lying on the Lt side. Symptoms have been present for the past month. 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    Pertinent History arthritis bilat knees; Lt knee scope; HTN; asthma; allergies; shoulder pain; wrist pain    Patient Stated Goals get back to exercise and get rid of pain in the back    Currently in Pain? Yes    Pain Score 1     Pain Location Back    Pain Orientation Right    Pain Descriptors / Indicators Aching;Dull    Pain Type Acute pain    Pain Radiating Towards into Rt LE    Pain Onset More than a month ago    Pain Frequency Intermittent    Aggravating Factors  walking; steps; lying on Lt side    Pain Relieving  Factors rest; meds; heat              [facility] PT Assessment - 03/10/21 0001      Assessment   Medical Diagnosis LBP with Rt radiculopathy    Referring Provider (PT) Dr Dianah Field    Onset Date/Surgical Date 02/04/21    Hand Dominance Right    Next MD Visit to schedule    Prior Therapy here for shoulder; knee; LB      Precautions   Precautions None      Restrictions   Weight Bearing Restrictions No      Balance Screen   Has the patient fallen in the past 6 months No    Has the patient had a decrease in activity level because of a fear of falling?  No    Is the patient reluctant to leave their home because of a fear of falling?  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    Leisure working out with trainer 2-3 times/wk; yoga 1-2 times/wk; houshold chores; walking in neighborhood      Observation/Other Assessments   Focus on Therapeutic Outcomes (FOTO)  47      Sensation   Additional Comments WFL's sensation; pain into the lateral thigh to calf      Posture/Postural Control   Posture Comments head forward; flexed forward at hips; sits with wt shifted to Lt in sitting with Rt LE crossed      AROM   Right/Left Hip --   tight IR/ER Rt hip end ranges   Lumbar Flexion 100%    Lumbar Extension 80%    Lumbar - Right Side Bend 80% slight discomfort    Lumbar - Left Side Bend 90%    Lumbar - Right Rotation 75%    Lumbar - Left Rotation 75%      Strength   Overall Strength Comments WFL's      Flexibility   Hamstrings slight tightness Rt compared to Lt    Quadriceps WFL's    ITB tight Rt    Piriformis tight Rt      Palpation   Spinal mobility WFL's slightly tight with PA mobs lumbar spine    Palpation comment muscular  tightness Rt QL; lower lumbar paraspinals; piriformis; gluts      Special Tests   Other special tests (-) SLR, slump test                      Objective measurements completed on examination: See above findings.       [facility] Adult PT Treatment/Exercise - 03/10/21 0001      Self-Care   Other Self-Care Comments  avoid crossing legs (observed to cross Rt LE over Lt with wt shifted to Lt Rt hip hiked      Knee/Hip Exercises: Stretches   ITB Stretch Right;2 reps;30 seconds   supine with strap   Piriformis Stretch Right;2 reps;30 seconds   supine travell     Moist Heat Therapy   Number Minutes Moist Heat 10 Minutes    Moist Heat Location Lumbar Spine;Hip      Manual Therapy   Manual therapy comments skilled palpation to assess response to DN and manual work    Joint Mobilization PA mobs Rt greater trochanter    Soft tissue mobilization deep tissue work through Rt posterior hip - piriformis and gluts    Myofascial Release posterior hip    Passive ROM IR/ER Rt hip pt prone with hip extended, knee flexed            Trigger Point Dry Needling - 03/10/21 0001    Consent Given? Yes    Education Handout Provided Yes    Other Dry Needling Rt    Gluteus Medius Response Palpable increased muscle length    Gluteus Maximus Response Palpable increased muscle length    Piriformis Response Palpable increased muscle length                PT Education - 03/10/21 1004    Education Details HEP POC DN    Person(s) Educated Patient    Methods Explanation;Demonstration;Tactile cues;Verbal cues;Handout    Comprehension Verbalized understanding;Returned demonstration;Verbal cues required;Tactile cues required               PT Long Term Goals - 03/10/21 1248      PT LONG TERM GOAL #1   Title Improve posture  and alignment with patient to demonstrate good upright posture without crossing legs in sitting    Time 6    Period Weeks    Status New    Target Date 04/21/21       PT LONG TERM GOAL #2   Title Patient reports return to all functional activities including workouts with trainer and regular walking program    Time 6    Period Weeks    Status New    Target Date 04/21/21      PT LONG TERM GOAL #3   Title Decrease pain 80-100% for walking, sitting, stairs    Time 6    Period Weeks    Status New    Target Date 04/21/21      PT LONG TERM GOAL #4   Title Independent in HEP    Time 6    Period Weeks    Status New    Target Date 04/21/21      PT LONG TERM GOAL #5   Title Improve functioinal limitation score to 66    Time 6    Period Weeks    Status New    Target Date 04/21/21                  Plan - 03/10/21 1016    Clinical Impression Statement Patient presents with ~ 1 month history of Rt lumbar pain and intermittent radicular pain into the lateral Rt thigh and calf. Symptoms have improved some with medication and rest. Patient has continued intermittent pain; limited Rt hip mobility/ROM at end ranges; muscular tightness to palpation through the posterior lateral Rt hip; decreased activity level. She will benefit from PT to address problems identified.    Stability/Clinical Decision Making Stable/Uncomplicated    Rehab Potential Good    PT Frequency 2x / week    PT Duration 6 weeks    PT Treatment/Interventions ADLs/Self Care Home Management;Aquatic Therapy;Cryotherapy;Electrical Stimulation;Iontophoresis 4mg /ml Dexamethasone;Moist Heat;Ultrasound;Functional mobility training;Therapeutic activities;Therapeutic exercise;Balance training;Neuromuscular re-education;Patient/family education;Manual techniques;Dry needling;Taping    PT Next Visit Plan assess response to initial exercise and DN/manual work; review HEP; progress with core strengthening and stabilization; add myofacial ball release work; continue with DN/manual work and modalities as indicated.    PT Home Exercise Plan [id number]    Consulted and Agree with Plan of Care Patient            Patient will benefit from skilled therapeutic intervention in order to improve the following deficits and impairments:  Decreased range of motion,Decreased activity tolerance,Pain,Hypomobility,Improper body mechanics,Decreased mobility,Postural dysfunction  Visit Diagnosis: Acute right-sided low back pain with right-sided sciatica  Other symptoms and signs involving the musculoskeletal system     Problem List Patient Active Problem List   Diagnosis Date Noted  . Community acquired pneumonia 03/07/2021  . Right hip pain 01/24/2021  . Pansinusitis 02/09/2020  . Upper airway cough syndrome 07/10/2018  . PVC's (premature ventricular contractions) 08/07/2017  . Hemorrhoids, external 12/07/2015  . Trigger finger, acquired 11/17/2014  . Persistent insomnia 08/26/2014  . Diverticulosis 08/26/2014  . Benign essential hypertension 12/29/2013  . Spondylolisthesis of lumbar region 08/07/2013  . Seasonal allergies 04/24/2013  . Hyperlipidemia 04/24/2013  . Restless legs 04/24/2013  . Primary osteoarthritis of left knee 04/24/2013  . Annual physical exam 04/24/2013  . History of blepharoplasty 04/22/2013    Yerick Eggebrecht Nilda Simmer PT, MPH  03/10/2021, 12:53 PM  [facility] [place], [place], [address] Phone: [phone]  Fax:  [phone]  Name: Heidi Mclaughlin MRN: [id number] Date of Birth: 07-Jan-1956

## 2021-03-10 NOTE — Patient Instructions (Addendum)
Trigger Point Dry Needling  . What is Trigger Point Dry Needling (DN)? o DN is a physical therapy technique used to treat muscle pain and dysfunction. Specifically, DN helps deactivate muscle trigger points (muscle knots).  o A thin filiform needle is used to penetrate the skin and stimulate the underlying trigger point. The goal is for a local twitch response (LTR) to occur and for the trigger point to relax. No medication of any kind is injected during the procedure.   . What Does Trigger Point Dry Needling Feel Like?  o The procedure feels different for each individual patient. Some patients report that they do not actually feel the needle enter the skin and overall the process is not painful. Very mild bleeding may occur. However, many patients feel a deep cramping in the muscle in which the needle was inserted. This is the local twitch response.   Marland Kitchen How Will I feel after the treatment? o Soreness is normal, and the onset of soreness may not occur for a few hours. Typically this soreness does not last longer than two days.  o Bruising is uncommon, however; ice can be used to decrease any possible bruising.  o In rare cases feeling tired or nauseous after the treatment is normal. In addition, your symptoms may get worse before they get better, this period will typically not last longer than 24 hours.   . What Can I do After My Treatment? o Increase your hydration by drinking more water for the next 24 hours. o You may place ice or heat on the areas treated that have become sore, however, do not use heat on inflamed or bruised areas. Heat often brings more relief post needling. o You can continue your regular activities, but vigorous activity is not recommended initially after the treatment for 24 hours. o DN is best combined with other physical therapy such as strengthening, stretching, and other therapies.   Access Code: [email or web address]: [email or web address]: 04/28/2022Prepared  by: Epic Tribbett HoltExercises  Supine Piriformis Stretch with Leg Straight - 2 x daily - 7 x weekly - 1 sets - 3 reps - 30 sec hold  Supine ITB Stretch with Strap - 2 x daily - 7 x weekly - 1 sets - 3 reps - 30 sec hold

## 2021-03-14 ENCOUNTER — Other Ambulatory Visit: Payer: Self-pay

## 2021-03-14 ENCOUNTER — Ambulatory Visit (INDEPENDENT_AMBULATORY_CARE_PROVIDER_SITE_OTHER): Payer: Medicare Other

## 2021-03-14 DIAGNOSIS — J189 Pneumonia, unspecified organism: Secondary | ICD-10-CM

## 2021-03-14 DIAGNOSIS — J9809 Other diseases of bronchus, not elsewhere classified: Secondary | ICD-10-CM | POA: Diagnosis not present

## 2021-03-14 DIAGNOSIS — I2510 Atherosclerotic heart disease of native coronary artery without angina pectoris: Secondary | ICD-10-CM | POA: Diagnosis not present

## 2021-03-14 DIAGNOSIS — J181 Lobar pneumonia, unspecified organism: Secondary | ICD-10-CM | POA: Diagnosis not present

## 2021-03-14 DIAGNOSIS — R918 Other nonspecific abnormal finding of lung field: Secondary | ICD-10-CM | POA: Diagnosis not present

## 2021-03-14 LAB — I-STAT CREATININE (MANUAL ENTRY): Creatinine, Ser: 0.9 (ref 0.50–1.10)

## 2021-03-14 MED ORDER — IOHEXOL 300 MG/ML  SOLN
100.0000 mL | Freq: Once | INTRAMUSCULAR | Status: AC | PRN
  Administered 2021-03-14: 75 mL via INTRAVENOUS

## 2021-03-15 ENCOUNTER — Ambulatory Visit (INDEPENDENT_AMBULATORY_CARE_PROVIDER_SITE_OTHER): Payer: Medicare Other | Admitting: Rehabilitative and Restorative Service Providers"

## 2021-03-15 ENCOUNTER — Encounter: Payer: Self-pay | Admitting: *Deleted

## 2021-03-15 ENCOUNTER — Encounter: Payer: Self-pay | Admitting: Rehabilitative and Restorative Service Providers"

## 2021-03-15 DIAGNOSIS — M5441 Lumbago with sciatica, right side: Secondary | ICD-10-CM | POA: Diagnosis not present

## 2021-03-15 DIAGNOSIS — R29898 Other symptoms and signs involving the musculoskeletal system: Secondary | ICD-10-CM | POA: Diagnosis not present

## 2021-03-15 NOTE — Patient Instructions (Signed)
Access Code: [email or web address]: [email or web address]: 05/03/2022Prepared by: Ariba Lehnen HoltExercises  Supine Piriformis Stretch with Leg Straight - 2 x daily - 7 x weekly - 1 sets - 3 reps - 30 sec hold  Supine ITB Stretch with Strap - 2 x daily - 7 x weekly - 1 sets - 3 reps - 30 sec hold  Beginner Bridge - 2 x daily - 7 x weekly - 1 sets - 10 reps - 5-10 sec hold  Supine Bridge with Resistance Band - 2 x daily - 7 x weekly - 1-2 sets - 10 reps - 5-10 sec hold  Hooklying Isometric Clamshell - 2 x daily - 7 x weekly - 1 sets - 10 reps - 3 sec hold  Sit to Stand - 2 x daily - 7 x weekly - 1 sets - 10 reps - 3-5 sec hold

## 2021-03-15 NOTE — Therapy (Addendum)
[place], [place], [address] Phone: [phone]   Fax:  [phone]  Physical Therapy Treatment  Patient Details  Name: Heidi Mclaughlin MRN: [id number] Date of Birth: January 30, 1956 Referring Provider (Mclaughlin): Dr Dianah Field   Encounter Date: 03/15/2021   Mclaughlin End of Session - 03/15/21 0846    Visit Number 2    Number of Visits 12    Date for Mclaughlin Re-Evaluation 04/21/21    Mclaughlin Start Time 0845    Mclaughlin Stop Time 0930   MH x 10 min end of treatment   Mclaughlin Time Calculation (min) 45 min    Activity Tolerance Patient tolerated treatment well           Past Medical History:  Diagnosis Date  . Allergy   . Allergy-induced asthma   . Arthritis   . Hyperlipidemia   . Hypertension   . Irregular heart rhythm   . RLS (restless legs syndrome)     Past Surgical History:  Procedure Laterality Date  . ABDOMINAL HYSTERECTOMY  2003  . APPENDECTOMY  1982  . CHOLECYSTECTOMY  1982  . INCONTINENCE SURGERY  2003  . [place]  . TUBAL LIGATION  1992    There were no vitals filed for this visit.   Subjective Assessment - 03/15/21 0846    Subjective Patient reports a significant improvement following initial visit. She felt so much better. She can go up and down steps without pain.    Currently in Pain? No/denies    Pain Score 0-No pain              [facility] Mclaughlin Assessment - 03/15/21 0001      Assessment   Medical Diagnosis LBP with Rt radiculopathy    Referring Provider (Mclaughlin) Dr Dianah Field    Onset Date/Surgical Date 02/04/21    Hand Dominance Right    Next MD Visit to schedule    Prior Therapy here for shoulder; knee; LB      Palpation   Palpation comment decreased muscular tightness Rt QL; lower lumbar paraspinals; piriformis; gluts                         [facility] Adult Mclaughlin Treatment/Exercise - 03/15/21 0001      Knee/Hip Exercises: Stretches   ITB Stretch Right;2  reps;30 seconds   supine with strap   Piriformis Stretch Right;2 reps;30 seconds   supine travell     Knee/Hip Exercises: Seated   Sit to Sand 10 reps;without UE support   slow eccentric stand to sit     Knee/Hip Exercises: Supine   Bridges Strengthening;Both;10 reps   5 sec hold   Bridges with Clamshell Strengthening;Both;5 reps   10 sec hold   Other Supine Knee/Hip Exercises hip abduction with blue TB 3 sec hold x 10 reps            Trigger Point Dry Needling - 03/15/21 0001    Consent Given? Yes    Education Handout Provided Previously provided    Other Dry Needling Rt    Gluteus Medius Response Palpable increased muscle length    Gluteus Maximus Response Palpable increased muscle length    Piriformis Response Palpable increased muscle length                Mclaughlin Education - 03/15/21 0921    Education Details HEP    Person(s) Educated Patient    Methods Explanation;Demonstration;Tactile  cues;Verbal cues;Handout    Comprehension Verbalized understanding;Returned demonstration;Verbal cues required;Tactile cues required               Mclaughlin Long Term Goals - 03/10/21 1248      Mclaughlin LONG TERM GOAL #1   Title Improve posture and alignment with patient to demonstrate good upright posture without crossing legs in sitting    Time 6    Period Weeks    Status New    Target Date 04/21/21      Mclaughlin LONG TERM GOAL #2   Title Patient reports return to all functional activities including workouts with trainer and regular walking program    Time 6    Period Weeks    Status New    Target Date 04/21/21      Mclaughlin LONG TERM GOAL #3   Title Decrease pain 80-100% for walking, sitting, stairs    Time 6    Period Weeks    Status New    Target Date 04/21/21      Mclaughlin LONG TERM GOAL #4   Title Independent in HEP    Time 6    Period Weeks    Status New    Target Date 04/21/21      Mclaughlin LONG TERM GOAL #5   Title Improve functioinal limitation score to 66    Time 6    Period Weeks     Status New    Target Date 04/21/21                 Plan - 03/15/21 0849    Clinical Impression Statement Excellent response to initial treatment with patient reporting relief of pain. She has returned to walking up and down steps without pain or limitations. Continued with DN and manual work for posterior Rt hip. Added exercises for strengthening. Patient will return to trainer this week and see how she responds.    Rehab Potential Good    Mclaughlin Frequency 2x / week    Mclaughlin Duration 6 weeks    Mclaughlin Treatment/Interventions ADLs/Self Care Home Management;Aquatic Therapy;Cryotherapy;Electrical Stimulation;Iontophoresis 61m/ml Dexamethasone;Moist Heat;Ultrasound;Functional mobility training;Therapeutic activities;Therapeutic exercise;Balance training;Neuromuscular re-education;Patient/family education;Manual techniques;Dry needling;Taping    Mclaughlin Next Visit Plan continue DN/manual work; review HEP; progress with core strengthening and stabilization; add myofacial ball release work; continue with DN/manual work and modalities as indicated.    Mclaughlin Home Exercise Plan [phone]   Consulted and Agree with Plan of Care Patient           Patient will benefit from skilled therapeutic intervention in order to improve the following deficits and impairments:     Visit Diagnosis: Acute right-sided low back pain with right-sided sciatica  Other symptoms and signs involving the musculoskeletal system     Problem List Patient Active Problem List   Diagnosis Date Noted  . Community acquired pneumonia 03/07/2021  . Right hip pain 01/24/2021  . Pansinusitis 02/09/2020  . Upper airway cough syndrome 07/10/2018  . PVC's (premature ventricular contractions) 08/07/2017  . Hemorrhoids, external 12/07/2015  . Trigger finger, acquired 11/17/2014  . Persistent insomnia 08/26/2014  . Diverticulosis 08/26/2014  . Benign essential hypertension 12/29/2013  . Spondylolisthesis of lumbar region 08/07/2013  .  Seasonal allergies 04/24/2013  . Hyperlipidemia 04/24/2013  . Restless legs 04/24/2013  . Primary osteoarthritis of left knee 04/24/2013  . Annual physical exam 04/24/2013  . History of blepharoplasty 04/22/2013    Heidi Mclaughlin Heidi Mclaughlin, Heidi Mclaughlin  03/15/2021, 9:28 AM   [facility] [place], [place], [address] Phone: [phone]   Fax:  [phone]  Name: Heidi Mclaughlin MRN: [id number] Date of Birth: Aug 02, 1956  PHYSICAL THERAPY DISCHARGE SUMMARY  Visits from Start of Care: 2  Current functional level related to goals / functional outcomes: See progress note for discharge status    Remaining deficits: No LBP at last visit, independent in HEP     Education / Equipment: HEP   Plan: Patient agrees to discharge.  Patient goals were met. Patient is being discharged due to meeting the stated rehab goals.  ?????     Heidi Mclaughlin Heidi Mclaughlin, Heidi Mclaughlin 04/14/21 2:20 PM

## 2021-03-15 NOTE — Progress Notes (Signed)
Reached out to [name] to introduce myself as the office RN Navigator and explain our new patient process. Reviewed the reason for their referral and scheduled their new patient appointment along with labs. Provided address and directions to the office including call back phone number.  Oncology Nurse Navigator Documentation  Oncology Nurse Navigator Flowsheets 03/15/2021  Abnormal Finding Date 03/15/2021  Diagnosis Status Additional Work Up  Navigator Follow Up Date: 03/18/2021  Navigator Follow Up Reason: New Patient Appointment  Navigator Location [facility]  Referral Date to RadOnc/MedOnc 03/15/2021  Navigator Encounter Type Introductory Phone Call  Patient Visit Type MedOnc  Treatment Phase Abnormal Scans  Barriers/Navigation Needs Coordination of Care;Education  Education Other  Interventions Coordination of Care;Education  Acuity Level 2-Minimal Needs (1-2 Barriers Identified)  Coordination of Care Appts  Education Method Verbal  Support Groups/Services Friends and Family  Time Spent with Patient 54

## 2021-03-15 NOTE — Addendum Note (Signed)
Addended by: Silverio Decamp on: 03/15/2021 12:40 PM   Modules accepted: Orders

## 2021-03-17 ENCOUNTER — Telehealth: Payer: Self-pay | Admitting: Sports Medicine

## 2021-03-17 NOTE — Telephone Encounter (Signed)
I called pt and she is aware of appt with BI next week   05/12 at 11.  She is aware to arrive around 10:45

## 2021-03-18 ENCOUNTER — Other Ambulatory Visit: Payer: Self-pay | Admitting: Pulmonary Disease

## 2021-03-18 ENCOUNTER — Encounter: Payer: Self-pay | Admitting: *Deleted

## 2021-03-18 ENCOUNTER — Other Ambulatory Visit: Payer: Self-pay

## 2021-03-18 ENCOUNTER — Encounter: Payer: Self-pay | Admitting: Hematology & Oncology

## 2021-03-18 ENCOUNTER — Inpatient Hospital Stay (HOSPITAL_BASED_OUTPATIENT_CLINIC_OR_DEPARTMENT_OTHER): Payer: Medicare Other | Admitting: Hematology & Oncology

## 2021-03-18 ENCOUNTER — Encounter: Payer: Medicare Other | Admitting: Rehabilitative and Restorative Service Providers"

## 2021-03-18 ENCOUNTER — Inpatient Hospital Stay: Payer: Medicare Other | Attending: Hematology & Oncology

## 2021-03-18 VITALS — BP 130/82 | HR 90 | Temp 98.5°F | Resp 18 | Ht 62.0 in | Wt 150.0 lb

## 2021-03-18 DIAGNOSIS — Z79899 Other long term (current) drug therapy: Secondary | ICD-10-CM | POA: Insufficient documentation

## 2021-03-18 DIAGNOSIS — C3491 Malignant neoplasm of unspecified part of right bronchus or lung: Secondary | ICD-10-CM | POA: Diagnosis not present

## 2021-03-18 DIAGNOSIS — R918 Other nonspecific abnormal finding of lung field: Secondary | ICD-10-CM | POA: Diagnosis not present

## 2021-03-18 DIAGNOSIS — C7931 Secondary malignant neoplasm of brain: Secondary | ICD-10-CM | POA: Insufficient documentation

## 2021-03-18 DIAGNOSIS — Z7982 Long term (current) use of aspirin: Secondary | ICD-10-CM | POA: Insufficient documentation

## 2021-03-18 DIAGNOSIS — Z7952 Long term (current) use of systemic steroids: Secondary | ICD-10-CM | POA: Insufficient documentation

## 2021-03-18 LAB — CMP (CANCER CENTER ONLY)
ALT: 17 U/L (ref 0–44)
AST: 14 U/L — ABNORMAL LOW (ref 15–41)
Albumin: 4.1 g/dL (ref 3.5–5.0)
Alkaline Phosphatase: 49 U/L (ref 38–126)
Anion gap: 10 (ref 5–15)
BUN: 17 mg/dL (ref 8–23)
CO2: 27 mmol/L (ref 22–32)
Calcium: 9.4 mg/dL (ref 8.9–10.3)
Chloride: 98 mmol/L (ref 98–111)
Creatinine: 0.89 mg/dL (ref 0.44–1.00)
GFR, Estimated: >60 mL/min (ref >60)
Glucose, Bld: 141 mg/dL — ABNORMAL HIGH (ref 70–99)
Potassium: 3.2 mmol/L — ABNORMAL LOW (ref 3.5–5.1)
Sodium: 135 mmol/L (ref 135–145)
Total Bilirubin: 0.7 mg/dL (ref 0.3–1.2)
Total Protein: 6.1 g/dL — ABNORMAL LOW (ref 6.5–8.1)

## 2021-03-18 LAB — CBC WITH DIFFERENTIAL (CANCER CENTER ONLY)
Abs Immature Granulocytes: 0.07 10*3/uL (ref 0.00–0.07)
Basophils Absolute: 0 10*3/uL (ref 0.0–0.1)
Basophils Relative: 0 %
Eosinophils Absolute: 0.1 10*3/uL (ref 0.0–0.5)
Eosinophils Relative: 1 %
HCT: 45.4 % (ref 36.0–46.0)
Hemoglobin: 16.1 g/dL — ABNORMAL HIGH (ref 12.0–15.0)
Immature Granulocytes: 1 %
Lymphocytes Relative: 27 %
Lymphs Abs: 2.8 10*3/uL (ref 0.7–4.0)
MCH: 30 pg (ref 26.0–34.0)
MCHC: 35.5 g/dL (ref 30.0–36.0)
MCV: 84.5 fL (ref 80.0–100.0)
Monocytes Absolute: 0.7 10*3/uL (ref 0.1–1.0)
Monocytes Relative: 7 %
Neutro Abs: 6.5 10*3/uL (ref 1.7–7.7)
Neutrophils Relative %: 64 %
Platelet Count: 286 10*3/uL (ref 150–400)
RBC: 5.37 MIL/uL — ABNORMAL HIGH (ref 3.87–5.11)
RDW: 12.4 % (ref 11.5–15.5)
WBC Count: 10.1 10*3/uL (ref 4.0–10.5)
nRBC: 0 % (ref 0.0–0.2)

## 2021-03-18 LAB — LACTATE DEHYDROGENASE: LDH: 147 U/L (ref 98–192)

## 2021-03-18 LAB — SAVE SMEAR(SSMR), FOR PROVIDER SLIDE REVIEW

## 2021-03-18 NOTE — Progress Notes (Signed)
Referral MD  Reason for Referral: Stage IV bronchogenic carcinoma-intrathoracic metastasis  Chief Complaint  Patient presents with  . New Patient (Initial Visit)  : I was told I have lung cancer.  HPI: Heidi Mclaughlin is an incredibly charming and finding 65 year old white female.  She certainly appears and acts a lot younger.  She exercises 4 or 5 times a week.  She has her own financial consulting firm.  She comes in with her husband.  She has been very healthy.  She has had past surgeries without any difficulties.  She had a hysterectomy, she has had a cholecystectomy and appendectomy.  She began to have a cough.  This was not related to any fever.  She was coughing up some mucus.  She was on antibiotics.  This did not seem to help.  She then began to have some hemoptysis.  She saw her family doctor or actually had a virtual visit.  He went ahead and ordered a CT scan on her.  This was a CT scan of the chest.  This was done on 03/14/2021.  Surprisingly, the CT scan showed enlarged right hilar, subcarinal lymph pretracheal lymph nodes.  There was a mass in the right upper lobe measuring 5.8 x 5.1 x 3.6 cm.  It occluded the right upper lobe bronchus.  She had pulmonary nodules bilaterally.  It was apparent that she has a bronchogenic carcinoma.  She has NEVER smoked.  There is a strong history in the family of lung cancer.  She was kindly referred to the [facility] for evaluation.  She has not really lost weight.  Her appetite is doing pretty well.  There is no change in bowel or bladder habits.  She is up-to-date with her screening studies.  She has had no headache.  There is no leg swelling.  She has had no rashes.  I would have to say that overall, her performance status is ECOG 0.    Past Medical History:  Diagnosis Date  . Allergy   . Allergy-induced asthma   . Arthritis   . Hyperlipidemia   . Hypertension   . Irregular heart rhythm   . RLS (restless legs  syndrome)   :  Past Surgical History:  Procedure Laterality Date  . ABDOMINAL HYSTERECTOMY  2003  . APPENDECTOMY  1982  . CHOLECYSTECTOMY  1982  . INCONTINENCE SURGERY  2003  . [place]  . TUBAL LIGATION  1992  :   Current Outpatient Medications:  .  cetirizine (ZYRTEC) 10 MG tablet, Take 10 mg by mouth., Disp: , Rfl:  .  fluticasone (FLOVENT HFA) 110 MCG/ACT inhaler, Inhale 1 puff into the lungs in the morning and at bedtime., Disp: 1 each, Rfl: 12 .  folic acid (FOLVITE) 1 MG tablet, TAKE 1 TABLET BY MOUTH EVERY DAY, Disp: 90 tablet, Rfl: 2 .  hydrochlorothiazide (HYDRODIURIL) 25 MG tablet, TAKE 1 TABLET BY MOUTH EVERY DAY, Disp: 90 tablet, Rfl: 3 .  levofloxacin (LEVAQUIN) 750 MG tablet, Take 1 tablet (750 mg total) by mouth daily., Disp: 10 tablet, Rfl: 0 .  mometasone (NASONEX) 50 MCG/ACT nasal spray, One spray in each nostril twice a day, use left hand for right nostril, and right hand for left nostril.  Please dispense one bottle. (Patient taking differently: One spray in each nostril twice a day, use left hand for right nostril, and right hand for left nostril.  Please dispense one bottle.), Disp: 1 g, Rfl: 6 .  montelukast (SINGULAIR) 10 MG tablet, TAKE 1 TABLET BY MOUTH EVERYDAY AT BEDTIME, Disp: 90 tablet, Rfl: 1 .  Polyethyl Glycol-Propyl Glycol (SYSTANE) 0.4-0.3 % SOLN, , Disp: , Rfl:  .  predniSONE (STERAPRED UNI-PAK 48 TAB) 10 MG (48) TBPK tablet, Take by mouth daily. 12-day taper pack, use as directed for taper, Disp: 1 tablet, Rfl: 0 .  rOPINIRole (REQUIP) 1 MG tablet, TAKE 1 TABLET (1 MG TOTAL) BY MOUTH AT BEDTIME., Disp: 90 tablet, Rfl: 1 .  simvastatin (ZOCOR) 40 MG tablet, TAKE 1 TABLET BY MOUTH EVERY DAY, Disp: 90 tablet, Rfl: 3 .  valACYclovir (VALTREX) 500 MG tablet, TAKE 1 TABLET (500 MG TOTAL) BY MOUTH AS NEEDED., Disp: 90 tablet, Rfl: 0 .  VENTOLIN HFA 108 (90 Base) MCG/ACT inhaler, TAKE 2 PUFFS BY MOUTH EVERY 6 HOURS AS NEEDED FOR  WHEEZE OR SHORTNESS OF BREATH, Disp: 18 each, Rfl: 11:  :  Allergies  Allergen Reactions  . Amoxicillin Anaphylaxis  . Penicillins Anaphylaxis  . Pentazocine-Naloxone Hcl Anxiety and Other (See Comments)    insomnia insomnia   . Poison Ivy Extract [Poison Ivy Extract] Rash  :  Family History  Problem Relation Age of Onset  . Cancer Mother        lung  . Hypertension Father   . Heart attack Father   . Pancreatic cancer Paternal Uncle   . Colon cancer Neg Hx   . Esophageal cancer Neg Hx   . Rectal cancer Neg Hx   . Stomach cancer Neg Hx   :  Social History   Socioeconomic History  . Marital status: Married    Spouse name: Heidi Mclaughlin  . Number of children: 2  . Years of education: 48  . Highest education level: Bachelor's degree (e.g., BA, AB, BS)  Occupational History    Comment: works full time  Tobacco Use  . Smoking status: Former Smoker    Quit date: 11/13/1972    Years since quitting: 48.3  . Smokeless tobacco: Never Used  Vaping Use  . Vaping Use: Never used  Substance and Sexual Activity  . Alcohol use: Yes    Alcohol/week: 1.0 standard drink    Types: 1 Glasses of wine per week    Comment: once a week  . Drug use: No  . Sexual activity: Not Currently    Birth control/protection: Surgical  Other Topics Concern  . Not on file  Social History Narrative   Works full-time. Lives with her husband. Likes to exercise, read and travel.    Social Determinants of Health   Financial Resource Strain: Low Risk   . Difficulty of Paying Living Expenses: Not hard at all  Food Insecurity: No Food Insecurity  . Worried About Charity fundraiser in the Last Year: Never true  . Ran Out of Food in the Last Year: Never true  Transportation Needs: No Transportation Needs  . Lack of Transportation (Medical): No  . Lack of Transportation (Non-Medical): No  Physical Activity: Sufficiently Active  . Days of Exercise per Week: 4 days  . Minutes of Exercise per Session: 60 min   Stress: No Stress Concern Present  . Feeling of Stress : Not at all  Social Connections: Socially Integrated  . Frequency of Communication with Friends and Family: More than three times a week  . Frequency of Social Gatherings with Friends and Family: Twice a week  . Attends Religious Services: More than 4 times per year  . Active Member of Clubs or Organizations:  Yes  . Attends Club or Organization Meetings: More than 4 times per year  . Marital Status: Married  Human resources officer Violence: Not At Risk  . Fear of Current or Ex-Partner: No  . Emotionally Abused: No  . Physically Abused: No  . Sexually Abused: No  :  Review of Systems  Constitutional: Negative.   HENT: Negative.   Eyes: Negative.   Respiratory: Positive for cough and hemoptysis.   Cardiovascular: Negative.   Gastrointestinal: Negative.   Genitourinary: Negative.   Musculoskeletal: Negative.   Skin: Negative.   Neurological: Negative.   Endo/Heme/Allergies: Negative.   Psychiatric/Behavioral: Negative.      Exam:  This is a well-developed well-nourished white female in no obvious distress.  Vital signs show a temperature of 98.5.  Pulse 90.  Blood pressure 130/82.  Weight is 150 pounds.  Head and neck exam shows no ocular or oral lesions.  She has no adenopathy in the cervical or supraclavicular lymph nodes.  Lungs are clear bilaterally.  She has good air movement bilaterally.  I do not hear any wheezes or friction rub bilaterally.  Cardiac exam regular rate and rhythm with no murmurs, rubs or bruits.  Abdomen is soft.  She has good bowel sounds.  There is no fluid wave.  There is no palpable liver or spleen tip.  Back exam shows no tenderness over the spine, ribs or hips.  Extremities shows no clubbing, cyanosis or edema.  Neurological exam shows no focal neurological deficits.  Skin exam shows no rashes, ecchymoses or petechia.    '@IPVITALS' @   Recent Labs    03/18/21 1104  WBC 10.1  HGB 16.1*  HCT 45.4   PLT 286   Recent Labs    03/18/21 1104  NA 135  K 3.2*  CL 98  CO2 27  GLUCOSE 141*  BUN 17  CREATININE 0.89  CALCIUM 9.4    Blood smear review: None  Pathology: Pending    Assessment and Plan: Heidi Mclaughlin is a very charming 65 year old white female.  She apparently and in all likelihood has a bronchogenic carcinoma.  Given the fact that she has never smoked, and that there is a family history of lung cancer, I have to believe that this is going to be an adenocarcinoma.  More importantly, his affect I have to believe that this is going to have a genetic mutation (i.e. EGFR, ALK, RET, MET) that we might be able to target with one of our new drugs.  We clearly have to get a biopsy.  She actually is going to see Dr. Carney Corners of pulmonary medicine next week.  Hopefully he will be able to do a bronchoscopy and get tissue so we can do our molecular analysis.  If, for some reason, he cannot get enough material for molecular analysis, we will have to do a liquid biopsy.  We will have to get a PET scan on her.  From the CT scan, she only has thoracic disease.  I need to see if she has any extrathoracic disease.  We also need to get a MRI of the brain.  I spent a good hour with Heidi Mclaughlin and her husband.  They are both very very nice.  She has a wonderful attitude.  She is in great shape.  I explained what I thought was going on.  I talked her about the fact that she likely has a genetic mutation that we might be able to target with a pill.  If,  she does not have any molecular arrangements, then we will have to use chemo immunotherapy for treatment.  I reviewed the CT scan with them.  I explained the CT scan and where her tumor have metastasized.  Again she is in great shape.  We really can be aggressive here.  We will try to move through quickly and try to get everything done in a couple weeks and then get her back so we can finalize our therapy.

## 2021-03-18 NOTE — Progress Notes (Signed)
Please schedule the following:   Diagnosis: lung mass Which side if for nodule / mass? unsure Procedure: EBUS  Has patient been spoken to by Provider and given informed consent? yes Anesthesia: general Do you need Fluro? no Time needed for procedure: 75 mins Date: 03/22/2021 Alternate Date: none  Time: AM any time/ PM no Location: ENDO, can do OR at 3 pm if ENDO not open Does patient have OSA? no DM? no Or Latex allergy? no Medication Restriction: no Anticoagulate/Antiplatelet: none Pre-op Labs Ordered no labs needed Imaging request: no (If, SuperDimension CT Chest, please have STAT courier sent to [facility].)  Please coordinate Pre-op COVID Testing

## 2021-03-18 NOTE — Progress Notes (Signed)
Initial RN Navigator Patient Visit  Name: Heidi Mclaughlin Date of Referral : 03/15/2021 Diagnosis: Probable Lung Cancer  Met with patient prior to their visit with MD. Hanley Seamen patient "Your Patient Navigator" handout which explains my role, areas in which I am able to help, and all the contact information for myself and the office. Also gave patient MD and Navigator business card. Reviewed with patient the general overview of expected course after initial diagnosis and time frame for all steps to be completed.  New patient packet given to patient which includes: orientation to office and staff; campus directory; education on My Chart and Advance Directives; and patient centered education on Lung Cancer.   Patient is already scheduled to see pulmonology on 03/24/21.  Patient will need MRI and PET for staging. Scheduled. Patient notified of appointments including date, time and location. Educated on prep for both scans. Radiology info sheet mailed to patient home for reinforcement of education.   Patient understands all follow up procedures and expectations. They have my number to reach out for any further clarification or additional needs.  Oncology Nurse Navigator Documentation  Oncology Nurse Navigator Flowsheets 03/18/2021  Abnormal Finding Date -  Diagnosis Status -  Navigator Follow Up Date: 03/24/2021  Navigator Follow Up Reason: Appointment Review  Navigator Location [facility]  Referral Date to RadOnc/MedOnc -  Navigator Encounter Type Initial MedOnc;Telephone  Telephone Appt Confirmation/Clarification;Education;Outgoing Call  Patient Visit Type MedOnc  Treatment Phase Abnormal Scans  Barriers/Navigation Needs Coordination of Care;Education  Education Other  Interventions Coordination of Care;Education;Psycho-Social Support  Acuity Level 2-Minimal Needs (1-2 Barriers Identified)  Coordination of Care Appts;Radiology  Education Method Verbal;Written  Support Groups/Services  Friends and Family  Time Spent with Patient 33

## 2021-03-21 ENCOUNTER — Telehealth: Payer: Self-pay

## 2021-03-21 ENCOUNTER — Encounter (HOSPITAL_COMMUNITY): Payer: Self-pay | Admitting: Pulmonary Disease

## 2021-03-21 ENCOUNTER — Encounter: Payer: Self-pay | Admitting: *Deleted

## 2021-03-21 ENCOUNTER — Telehealth: Payer: Self-pay | Admitting: Pulmonary Disease

## 2021-03-21 ENCOUNTER — Other Ambulatory Visit: Payer: Self-pay

## 2021-03-21 ENCOUNTER — Other Ambulatory Visit (HOSPITAL_COMMUNITY)
Admission: RE | Admit: 2021-03-21 | Discharge: 2021-03-21 | Disposition: A | Discharge status: Home / Self Care | Payer: Medicare Other | Source: Ambulatory Visit | Attending: Pulmonary Disease | Admitting: Pulmonary Disease

## 2021-03-21 DIAGNOSIS — Z01812 Encounter for preprocedural laboratory examination: Secondary | ICD-10-CM | POA: Insufficient documentation

## 2021-03-21 DIAGNOSIS — Z20822 Contact with and (suspected) exposure to covid-19: Secondary | ICD-10-CM | POA: Diagnosis not present

## 2021-03-21 LAB — SARS CORONAVIRUS 2 (TAT 6-24 HRS): SARS Coronavirus 2: NEGATIVE

## 2021-03-21 LAB — CEA (IN HOUSE-CHCC): CEA (CHCC-In House): 56.22 ng/mL — ABNORMAL HIGH (ref 0.00–5.00)

## 2021-03-21 NOTE — Telephone Encounter (Signed)
No 03/18/21 los   Heidi Mclaughlin

## 2021-03-21 NOTE — Progress Notes (Signed)
Patient called stating that she received a call late Friday evening asking her to complete an appointment this Tuesday. She isn't sure who called and her MyChart hasn't been updated.   Sent message to Dr Valeta Harms office. While waiting for response, Dr Juline Patch office reached out to patient to finalize scheduling for her bronch tomorrow.   Confirmed with patient that she has all needed information and is aware of appointment dates and times.   Oncology Nurse Navigator Documentation  Oncology Nurse Navigator Flowsheets 03/21/2021  Abnormal Finding Date -  Diagnosis Status -  Navigator Follow Up Date: 03/22/2021  Navigator Follow Up Reason: Surgery  Navigator Location [facility]  Referral Date to RadOnc/MedOnc -  Navigator Encounter Type Telephone  Telephone Appt Confirmation/Clarification;Incoming Call  Patient Visit Type MedOnc  Treatment Phase Abnormal Scans  Barriers/Navigation Needs Coordination of Care;Education  Education Other  Interventions Coordination of Care  Acuity Level 2-Minimal Needs (1-2 Barriers Identified)  Coordination of Care Other  Education Method Verbal  Support Groups/Services Friends and Family  Time Spent with Patient 30

## 2021-03-21 NOTE — Progress Notes (Signed)
PCP - Silverio Decamp, MD Cardiologist - deneis   Chest x-ray - 03/07/21 EKG - 10/02/20 Stress Test -  ECHO -  Cardiac Cath -   COVID TEST- DOS - pt got swabbed 5/9 but went to the gym later in the day - needs to be retested   Anesthesia review: n/a  -------------  SDW INSTRUCTIONS:  Your procedure is scheduled on 5/10. Please report to [facility] Main Entrance "A" at 11:55 A.M., and check in at the Admitting office. Call this number if you have problems the morning of surgery: [phone]   Remember: Do not eat or drink after midnight the night before your surgery Medications to take morning of surgery with a sip of water include: cetirizine (ZYRTEC)  fluticasone (FLONASE)  montelukast (SINGULAIR)    As of today, STOP taking any Aspirin (unless otherwise instructed by your surgeon), Aleve, Naproxen, Ibuprofen, Motrin, Advil, Goody's, BC's, all herbal medications, fish oil, and all vitamins.    The Morning of Surgery Do not wear jewelry, make-up or nail polish. Do not wear lotions, powders, or perfumes, or deodorant Do not shave 48 hours prior to surgery.   Do not bring valuables to the hospital. [facility] is not responsible for any belongings or valuables. If you are a smoker, DO NOT Smoke 24 hours prior to surgery If you wear a CPAP at night please bring your mask the morning of surgery  Remember that you must have someone to transport you home after your surgery, and remain with you for 24 hours if you are discharged the same day. Please bring cases for contacts, glasses, hearing aids, dentures or bridgework because it cannot be worn into surgery.   Patients discharged the day of surgery will not be allowed to drive home.   Please shower the NIGHT BEFORE SURGERY and the MORNING OF SURGERY with DIAL Soap. Wear comfortable clothes the morning of surgery. Oral Hygiene is also important to reduce your risk of infection.  Remember - BRUSH YOUR TEETH THE MORNING OF  SURGERY WITH YOUR REGULAR TOOTHPASTE  Patient denies shortness of breath, fever, cough and chest pain.

## 2021-03-22 ENCOUNTER — Other Ambulatory Visit: Payer: Self-pay

## 2021-03-22 ENCOUNTER — Ambulatory Visit (HOSPITAL_COMMUNITY): Payer: Medicare Other

## 2021-03-22 ENCOUNTER — Ambulatory Visit (HOSPITAL_COMMUNITY)
Admission: RE | Admit: 2021-03-22 | Discharge: 2021-03-22 | Disposition: A | Discharge status: Home / Self Care | Payer: Medicare Other | Attending: Pulmonary Disease | Admitting: Pulmonary Disease

## 2021-03-22 ENCOUNTER — Encounter: Payer: Medicare Other | Admitting: Rehabilitative and Restorative Service Providers"

## 2021-03-22 ENCOUNTER — Ambulatory Visit (HOSPITAL_COMMUNITY): Payer: Medicare Other | Admitting: Certified Registered"

## 2021-03-22 ENCOUNTER — Encounter: Payer: Self-pay | Admitting: *Deleted

## 2021-03-22 ENCOUNTER — Encounter (HOSPITAL_COMMUNITY): Payer: Self-pay | Admitting: Pulmonary Disease

## 2021-03-22 ENCOUNTER — Encounter (HOSPITAL_COMMUNITY)
Admission: RE | Disposition: A | Discharge status: Home / Self Care | Payer: Self-pay | Source: Home / Self Care | Attending: Pulmonary Disease

## 2021-03-22 DIAGNOSIS — Z880 Allergy status to penicillin: Secondary | ICD-10-CM | POA: Diagnosis not present

## 2021-03-22 DIAGNOSIS — I509 Heart failure, unspecified: Secondary | ICD-10-CM | POA: Diagnosis not present

## 2021-03-22 DIAGNOSIS — I10 Essential (primary) hypertension: Secondary | ICD-10-CM | POA: Insufficient documentation

## 2021-03-22 DIAGNOSIS — Z87891 Personal history of nicotine dependence: Secondary | ICD-10-CM | POA: Diagnosis not present

## 2021-03-22 DIAGNOSIS — E785 Hyperlipidemia, unspecified: Secondary | ICD-10-CM | POA: Insufficient documentation

## 2021-03-22 DIAGNOSIS — C771 Secondary and unspecified malignant neoplasm of intrathoracic lymph nodes: Secondary | ICD-10-CM | POA: Diagnosis not present

## 2021-03-22 DIAGNOSIS — Z789 Other specified health status: Secondary | ICD-10-CM

## 2021-03-22 DIAGNOSIS — Z801 Family history of malignant neoplasm of trachea, bronchus and lung: Secondary | ICD-10-CM

## 2021-03-22 DIAGNOSIS — Z90710 Acquired absence of both cervix and uterus: Secondary | ICD-10-CM | POA: Diagnosis not present

## 2021-03-22 DIAGNOSIS — C3490 Malignant neoplasm of unspecified part of unspecified bronchus or lung: Secondary | ICD-10-CM | POA: Diagnosis not present

## 2021-03-22 DIAGNOSIS — C3411 Malignant neoplasm of upper lobe, right bronchus or lung: Secondary | ICD-10-CM | POA: Diagnosis not present

## 2021-03-22 DIAGNOSIS — C801 Malignant (primary) neoplasm, unspecified: Secondary | ICD-10-CM | POA: Diagnosis not present

## 2021-03-22 DIAGNOSIS — Z9049 Acquired absence of other specified parts of digestive tract: Secondary | ICD-10-CM | POA: Diagnosis not present

## 2021-03-22 DIAGNOSIS — D165 Benign neoplasm of lower jaw bone: Secondary | ICD-10-CM

## 2021-03-22 DIAGNOSIS — R846 Abnormal cytological findings in specimens from respiratory organs and thorax: Secondary | ICD-10-CM | POA: Insufficient documentation

## 2021-03-22 DIAGNOSIS — R599 Enlarged lymph nodes, unspecified: Secondary | ICD-10-CM

## 2021-03-22 DIAGNOSIS — I2510 Atherosclerotic heart disease of native coronary artery without angina pectoris: Secondary | ICD-10-CM | POA: Diagnosis not present

## 2021-03-22 DIAGNOSIS — R918 Other nonspecific abnormal finding of lung field: Secondary | ICD-10-CM

## 2021-03-22 DIAGNOSIS — Z888 Allergy status to other drugs, medicaments and biological substances status: Secondary | ICD-10-CM | POA: Diagnosis not present

## 2021-03-22 DIAGNOSIS — Z9889 Other specified postprocedural states: Secondary | ICD-10-CM | POA: Diagnosis not present

## 2021-03-22 DIAGNOSIS — R911 Solitary pulmonary nodule: Secondary | ICD-10-CM | POA: Diagnosis not present

## 2021-03-22 HISTORY — PX: BRONCHIAL BIOPSY: SHX5109

## 2021-03-22 HISTORY — PX: VIDEO BRONCHOSCOPY WITH ENDOBRONCHIAL ULTRASOUND: SHX6177

## 2021-03-22 HISTORY — PX: BRONCHIAL WASHINGS: SHX5105

## 2021-03-22 HISTORY — PX: BRONCHIAL BRUSHINGS: SHX5108

## 2021-03-22 HISTORY — PX: BRONCHIAL NEEDLE ASPIRATION BIOPSY: SHX5106

## 2021-03-22 SURGERY — BRONCHOSCOPY, WITH EBUS
Anesthesia: General

## 2021-03-22 MED ORDER — PROPOFOL 10 MG/ML IV BOLUS
INTRAVENOUS | Status: DC | PRN
  Administered 2021-03-22: 150 mg via INTRAVENOUS
  Administered 2021-03-22: 30 mg via INTRAVENOUS
  Administered 2021-03-22: 20 mg via INTRAVENOUS

## 2021-03-22 MED ORDER — LIDOCAINE 2% (20 MG/ML) 5 ML SYRINGE
INTRAMUSCULAR | Status: DC | PRN
  Administered 2021-03-22: 100 mg via INTRAVENOUS

## 2021-03-22 MED ORDER — SUGAMMADEX SODIUM 200 MG/2ML IV SOLN
INTRAVENOUS | Status: DC | PRN
  Administered 2021-03-22: 200 mg via INTRAVENOUS

## 2021-03-22 MED ORDER — ONDANSETRON HCL 4 MG/2ML IJ SOLN
INTRAMUSCULAR | Status: DC | PRN
  Administered 2021-03-22: 4 mg via INTRAVENOUS

## 2021-03-22 MED ORDER — ROCURONIUM BROMIDE 10 MG/ML (PF) SYRINGE
PREFILLED_SYRINGE | INTRAVENOUS | Status: DC | PRN
  Administered 2021-03-22: 50 mg via INTRAVENOUS

## 2021-03-22 MED ORDER — CHLORHEXIDINE GLUCONATE 0.12 % MT SOLN: 15.0000 mL | Freq: Once | OROMUCOSAL | Status: AC

## 2021-03-22 MED ORDER — CHLORHEXIDINE GLUCONATE 0.12 % MT SOLN
OROMUCOSAL | Status: AC
  Administered 2021-03-22: 15 mL via OROMUCOSAL
  Filled 2021-03-22: qty 15

## 2021-03-22 MED ORDER — LACTATED RINGERS IV SOLN: INTRAVENOUS | Status: DC

## 2021-03-22 SURGICAL SUPPLY — 30 items
BRUSH CYTOL CELLEBRITY 1.5X140 (MISCELLANEOUS) IMPLANT
CANISTER SUCT 3000ML PPV (MISCELLANEOUS) ×4 IMPLANT
CONT SPEC 4OZ CLIKSEAL STRL BL (MISCELLANEOUS) ×4 IMPLANT
COVER BACK TABLE 60X90IN (DRAPES) ×4 IMPLANT
COVER DOME SNAP 22 D (MISCELLANEOUS) ×4 IMPLANT
FORCEPS BIOP RJ4 1.8 (CUTTING FORCEPS) IMPLANT
GAUZE SPONGE 4X4 12PLY STRL (GAUZE/BANDAGES/DRESSINGS) ×4 IMPLANT
GLOVE BIO SURGEON STRL SZ7.5 (GLOVE) ×4 IMPLANT
GOWN STRL REUS W/ TWL LRG LVL3 (GOWN DISPOSABLE) ×2 IMPLANT
GOWN STRL REUS W/TWL LRG LVL3 (GOWN DISPOSABLE) ×2
KIT CLEAN ENDO COMPLIANCE (KITS) ×8 IMPLANT
KIT TURNOVER KIT B (KITS) ×4 IMPLANT
MARKER SKIN DUAL TIP RULER LAB (MISCELLANEOUS) ×4 IMPLANT
NEEDLE EBUS SONO TIP PENTAX (NEEDLE) ×4 IMPLANT
NS IRRIG 1000ML POUR BTL (IV SOLUTION) ×4 IMPLANT
OIL SILICONE PENTAX (PARTS (SERVICE/REPAIRS)) ×4 IMPLANT
PAD ARMBOARD 7.5X6 YLW CONV (MISCELLANEOUS) ×8 IMPLANT
SOL ANTI FOG 6CC (MISCELLANEOUS) ×2 IMPLANT
SOLUTION ANTI FOG 6CC (MISCELLANEOUS) ×2
SYR 20CC LL (SYRINGE) ×8 IMPLANT
SYR 20ML ECCENTRIC (SYRINGE) ×8 IMPLANT
SYR 50ML SLIP (SYRINGE) IMPLANT
SYR 5ML LUER SLIP (SYRINGE) ×4 IMPLANT
TOWEL OR 17X24 6PK STRL BLUE (TOWEL DISPOSABLE) ×4 IMPLANT
TRAP SPECIMEN MUCOUS 40CC (MISCELLANEOUS) IMPLANT
TUBE CONNECTING 20'X1/4 (TUBING) ×2
TUBE CONNECTING 20X1/4 (TUBING) ×6 IMPLANT
UNDERPAD 30X30 (UNDERPADS AND DIAPERS) ×4 IMPLANT
VALVE DISPOSABLE (MISCELLANEOUS) ×4 IMPLANT
WATER STERILE IRR 1000ML POUR (IV SOLUTION) ×4 IMPLANT

## 2021-03-22 NOTE — H&P (View-Only) (Signed)
Synopsis: Referred in May 2022 for abnormal CT chest by No ref. provider found  Subjective:   PATIENT ID: Heidi Mclaughlin GENDER: female DOB: [birth year], MRN: [id number]  Chief complaint: Abnormal CT chest, concern for lung cancer  This is a 65 year old female past medical history of allergy, hypertension, hyperlipidemia, mother with lung cancer.  Patient was recently seen by Dr. Marin Olp from medical oncology on 03/18/2021.  Office visit reviewed today.  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  She had cough that would not resolve ultimately had hemoptysis.  Patient was then referred for a CT scan.  CT scan on 03/14/2021 revealed a enlarged right hilar subcarinal and pretracheal nodes with a right upper lobe 5.8 cm lung mass.  Patient is a lifelong never smoker.  She does have a family history of lung cancer.  Patient's mother had lung cancer.  Patient was referred for tissue sampling via bronchoscopy.   Past Medical History:  Diagnosis Date  . Allergy   . Allergy-induced asthma   . Arthritis   . Hyperlipidemia   . Hypertension   . Irregular heart rhythm   . RLS (restless legs syndrome)      Family History  Problem Relation Age of Onset  . Cancer Mother        lung  . Hypertension Father   . Heart attack Father   . Pancreatic cancer Paternal Uncle   . Colon cancer Neg Hx   . Esophageal cancer Neg Hx   . Rectal cancer Neg Hx   . Stomach cancer Neg Hx      Past Surgical History:  Procedure Laterality Date  . ABDOMINAL HYSTERECTOMY  2003  . APPENDECTOMY  1982  . CHOLECYSTECTOMY  1982  . INCONTINENCE SURGERY  2003  . [place]  . TUBAL LIGATION  1992    Social History   Socioeconomic History  . Marital status: Married    Spouse name: Richardson Landry  . Number of children: 2  . Years of education: 86  . Highest education level: Bachelor's degree (e.g., BA, AB, BS)  Occupational History    Comment: works full time  Tobacco Use  .  Smoking status: Former Smoker    Quit date: 11/13/1972    Years since quitting: 48.3  . Smokeless tobacco: Never Used  Vaping Use  . Vaping Use: Never used  Substance and Sexual Activity  . Alcohol use: Yes    Alcohol/week: 1.0 standard drink    Types: 1 Glasses of wine per week    Comment: once a week  . Drug use: No  . Sexual activity: Not Currently    Birth control/protection: Surgical  Other Topics Concern  . Not on file  Social History Narrative   Works full-time. Lives with her husband. Likes to exercise, read and travel.    Social Determinants of Health   Financial Resource Strain: Low Risk   . Difficulty of Paying Living Expenses: Not hard at all  Food Insecurity: No Food Insecurity  . Worried About Charity fundraiser in the Last Year: Never true  . Ran Out of Food in the Last Year: Never true  Transportation Needs: No Transportation Needs  . Lack of Transportation (Medical): No  . Lack of Transportation (Non-Medical): No  Physical Activity: Sufficiently Active  . Days of Exercise per Week: 4 days  . Minutes of Exercise per Session: 60 min  Stress: No Stress Concern Present  . Feeling of Stress :  Not at all  Social Connections: Socially Integrated  . Frequency of Communication with Friends and Family: More than three times a week  . Frequency of Social Gatherings with Friends and Family: Twice a week  . Attends Religious Services: More than 4 times per year  . Active Member of Clubs or Organizations: Yes  . Attends Archivist Meetings: More than 4 times per year  . Marital Status: Married  Human resources officer Violence: Not At Risk  . Fear of Current or Ex-Partner: No  . Emotionally Abused: No  . Physically Abused: No  . Sexually Abused: No     Allergies  Allergen Reactions  . Amoxicillin Anaphylaxis  . Penicillins Anaphylaxis  . Pentazocine-Naloxone Hcl Anxiety and Other (See Comments)    insomnia    . Poison Ivy Extract [Poison Ivy Extract] Rash      @ENCMEDSTART @  Review of Systems  Constitutional: Negative for chills, fever, malaise/fatigue and weight loss.  HENT: Negative for hearing loss, sore throat and tinnitus.   Eyes: Negative for blurred vision and double vision.  Respiratory: Positive for cough, hemoptysis and sputum production. Negative for shortness of breath, wheezing and stridor.   Cardiovascular: Negative for chest pain, palpitations, orthopnea, leg swelling and PND.  Gastrointestinal: Negative for abdominal pain, constipation, diarrhea, heartburn, nausea and vomiting.  Genitourinary: Negative for dysuria, hematuria and urgency.  Musculoskeletal: Negative for joint pain and myalgias.  Skin: Negative for itching and rash.  Neurological: Negative for dizziness, tingling, weakness and headaches.  Endo/Heme/Allergies: Negative for environmental allergies. Does not bruise/bleed easily.  Psychiatric/Behavioral: Negative for depression. The patient is not nervous/anxious and does not have insomnia.   All other systems reviewed and are negative.    Objective:  Physical Exam Vitals reviewed.  Constitutional:      General: She is not in acute distress.    Appearance: She is well-developed.  HENT:     Head: Normocephalic and atraumatic.  Eyes:     General: No scleral icterus.    Conjunctiva/sclera: Conjunctivae normal.     Pupils: Pupils are equal, round, and reactive to light.  Neck:     Vascular: No JVD.     Trachea: No tracheal deviation.  Cardiovascular:     Rate and Rhythm: Normal rate and regular rhythm.     Heart sounds: Normal heart sounds. No murmur heard.   Pulmonary:     Effort: Pulmonary effort is normal. No tachypnea, accessory muscle usage or respiratory distress.     Breath sounds: No stridor. No wheezing, rhonchi or rales.  Abdominal:     General: Bowel sounds are normal. There is no distension.     Palpations: Abdomen is soft.     Tenderness: There is no abdominal tenderness.   Musculoskeletal:        General: No tenderness.     Cervical back: Neck supple.  Lymphadenopathy:     Cervical: No cervical adenopathy.  Skin:    General: Skin is warm and dry.     Capillary Refill: Capillary refill takes less than 2 seconds.     Findings: No rash.  Neurological:     Mental Status: She is alert and oriented to person, place, and time.  Psychiatric:        Behavior: Behavior normal.      Vitals:   03/21/21 1736 03/22/21 1205  BP:  116/61  Pulse:  66  Resp:  17  Temp:  98.7 F (37.1 C)  TempSrc:  Oral  SpO2:  94%  Weight: 67.6 kg 67.6 kg  Height: 5\' 2"  (1.575 m) 5\' 2"  (1.575 m)   94% on RA BMI Readings from Last 3 Encounters:  03/22/21 27.25 kg/m  03/18/21 27.44 kg/m  02/21/21 27.98 kg/m   Wt Readings from Last 3 Encounters:  03/22/21 67.6 kg  03/18/21 68 kg  02/21/21 69.4 kg     CBC    Component Value Date/Time   WBC 10.1 03/18/2021 1104   WBC 4.3 10/20/2020 0822   RBC 5.37 (H) 03/18/2021 1104   HGB 16.1 (H) 03/18/2021 1104   HCT 45.4 03/18/2021 1104   PLT 286 03/18/2021 1104   MCV 84.5 03/18/2021 1104   MCH 30.0 03/18/2021 1104   MCHC 35.5 03/18/2021 1104   RDW 12.4 03/18/2021 1104   LYMPHSABS 2.8 03/18/2021 1104   MONOABS 0.7 03/18/2021 1104   EOSABS 0.1 03/18/2021 1104   BASOSABS 0.0 03/18/2021 1104     Chest Imaging: 03/14/2021 CT chest: Mediastinal adenopathy with an associated right upper lobe 5.8 cm lung mass concerning for advanced age bronchogenic carcinoma.   Pulmonary Functions Testing Results: No flowsheet data found.  FeNO:   Pathology:   Echocardiogram:   Heart Catheterization:     Assessment & Plan:    This is a 65 year old female, family history of lung cancer, and lifelong non-smoker, large 6 cm right upper lobe mass with associated mediastinal and hilar adenopathy concerning for an advanced age bronchogenic carcinoma.  Patient presents today for discussion and plans of a video bronchoscopy with  endobronchial ultrasound and biopsy.  Discussion: We discussed today the risk benefits and alternatives of proceeding with tissue sampling and biopsy. There is no barriers to proceed at this time.  Patient is agreeable for video bronchoscopy with endobronchial ultrasound and transbronchial needle aspiration. We will have patient to follow-up with medical oncology after procedure. We will also send them pathology final report. We will plan to send tissue for appropriate markers.   Garner Nash, DO [facility] 03/22/2021 3:23 PM

## 2021-03-22 NOTE — Anesthesia Preprocedure Evaluation (Addendum)
Anesthesia Evaluation  Patient identified by MRN, date of birth, ID band Patient awake    Reviewed: Allergy & Precautions, NPO status , Patient's Chart, lab work & pertinent test results  Airway Mallampati: I  TM Distance: >3 FB Neck ROM: Full    Dental no notable dental hx. (+) Teeth Intact, Dental Advisory Given   Pulmonary asthma , former smoker,  CT 03/2021 1. There is a dense, masslike consolidation of the suprahilar right upper lobe, measuring 5.8 x 5.1 x 3.6 cm. This occludes the apical segmental bronchus of the right upper lobe. There is postobstructive consolidation and nodularity throughout the right upper lobe. 2. There are numerous bilateral pulmonary nodules and opacities of varying sizes throughout the lungs. 3. Enlarged right hilar, subcarinal, and pretracheal lymph nodes. 4. Findings are most consistent with primary lung malignancy, nodal metastatic disease, and bilateral pulmonary metastatic disease. 5. Coronary artery disease.   Pulmonary exam normal breath sounds clear to auscultation       Cardiovascular hypertension, Pt. on medications + CAD  Normal cardiovascular exam Rhythm:Regular Rate:Normal     Neuro/Psych negative neurological ROS  negative psych ROS   GI/Hepatic negative GI ROS, Neg liver ROS,   Endo/Other  negative endocrine ROS  Renal/GU negative Renal ROS  negative genitourinary   Musculoskeletal negative musculoskeletal ROS (+)   Abdominal   Peds  Hematology negative hematology ROS (+)   Anesthesia Other Findings   Reproductive/Obstetrics                            Anesthesia Physical Anesthesia Plan  ASA: III  Anesthesia Plan: General   Post-op Pain Management:    Induction: Intravenous  PONV Risk Score and Plan: 3 and Midazolam, Dexamethasone and Ondansetron  Airway Management Planned: Oral ETT  Additional Equipment:   Intra-op Plan:    Post-operative Plan: Extubation in OR  Informed Consent: I have reviewed the patients History and Physical, chart, labs and discussed the procedure including the risks, benefits and alternatives for the proposed anesthesia with the patient or authorized representative who has indicated his/her understanding and acceptance.     Dental advisory given  Plan Discussed with: CRNA  Anesthesia Plan Comments:         Anesthesia Quick Evaluation

## 2021-03-22 NOTE — Op Note (Signed)
Video bronchoscopy with transbronchial biopsy and cytology brushing Video Bronchoscopy with Endobronchial Ultrasound Procedure Note  Date of Operation: 03/22/2021  Pre-op Diagnosis: Lung mass, adenopathy   Post-op Diagnosis: Lung mass, adenopathy   Surgeon: Garner Nash, DO   Assistants: None   Anesthesia: General endotracheal anesthesia  Operation: Flexible video fiberoptic bronchoscopy with endobronchial ultrasound and biopsies.  Estimated Blood Loss: Minimal  Complications: None   Indications and History: Heidi Mclaughlin is a 65 y.o. female with Lung mass, adenopathy.  The risks, benefits, complications, treatment options and expected outcomes were discussed with the patient.  The possibilities of pneumothorax, pneumonia, reaction to medication, pulmonary aspiration, perforation of a viscus, bleeding, failure to diagnose a condition and creating a complication requiring transfusion or operation were discussed with the patient who freely signed the consent.    Description of Procedure: The patient was examined in the preoperative area and history and data from the preprocedure consultation were reviewed. It was deemed appropriate to proceed.  The patient was taken to [facility] endoscopy room 2, identified as Heidi Mclaughlin and the procedure verified as Flexible Video Fiberoptic Bronchoscopy.  A Time Out was held and the above information confirmed. After being taken to the operating room general anesthesia was initiated and the patient  was orally intubated. The video fiberoptic bronchoscope was introduced via the endotracheal tube and a general inspection was performed which showed normal right and left lung anatomy, raised irregular mucosa along the superior wall of the right middle lobe opening, right upper lobe apical segment fully occluded with extrinsic compression of tumor.  Using the standard flexible bronchoscope cytology brush was inserted into the apical segment of the right upper  lobe for sampling.  We then used [facility] [employer] 2.8 cm forceps for biopsies of the right upper lobe.  We were unable to pass the forceps very far into the apical segment likely crowding into tumor.  We took several transbronchial biopsies from the right upper lobe to be sent for cytology.  Using the standard therapeutic bronchoscope we completed a BAL to the right upper lobe to be sent for cytology.  We then exchange scopes for the endobronchial ultrasound.  The standard scope was then withdrawn and the endobronchial ultrasound was used to identify and characterize the peritracheal, hilar and bronchial lymph nodes. Inspection showed enlarged subcarinal adenopathy. Using real-time ultrasound guidance Wang needle biopsies were take from Station 7 nodes and were sent for cytology.  We then switched out the endobronchial ultrasound scope for standard therapeutic bronchoscope.  Standard therapeutic bronchoscope was used for aspiration of the bilateral mainstem's for removal of any remaining blood clots and debris.  All distal subsegments were patent at the termination of the procedure and there was no evidence of active bleeding.  The patient tolerated the procedure well without apparent complications. There was no significant blood loss. The bronchoscope was withdrawn. Anesthesia was reversed and the patient was taken to the PACU for recovery.   Samples: 1. Wang needle biopsies from station 7 node 2.  Right upper lobe brushings 3.  Right upper lobe transbronchial biopsies 4.  Right upper lobe BAL  Plans:  The patient will be discharged from the PACU to home when recovered from anesthesia. We will review the cytology, pathology results with the patient when they become available. Outpatient followup will be with Garner Nash, Grainger Suraya Vidrine, DO [facility] 03/22/2021 4:51 PM

## 2021-03-22 NOTE — Transfer of Care (Signed)
Immediate Anesthesia Transfer of Care Note  Patient: Heidi Mclaughlin  Procedure(s) Performed: VIDEO BRONCHOSCOPY WITH ENDOBRONCHIAL ULTRASOUND (N/A )  Patient Location: PACU  Anesthesia Type:General  Level of Consciousness: awake and alert   Airway & Oxygen Therapy: Patient Spontanous Breathing  Post-op Assessment: Report given to RN and Post -op Vital signs reviewed and stable  Post vital signs: Reviewed and stable  Last Vitals:  Vitals Value Taken Time  BP    Temp    Pulse    Resp    SpO2      Last Pain:  Vitals:   03/22/21 1228  TempSrc:   PainSc: 0-No pain         Complications: No complications documented.

## 2021-03-22 NOTE — Anesthesia Postprocedure Evaluation (Signed)
Anesthesia Post Note  Patient: Heidi Mclaughlin  Procedure(s) Performed: VIDEO BRONCHOSCOPY WITH ENDOBRONCHIAL ULTRASOUND (N/A )     Patient location during evaluation: PACU Anesthesia Type: General Level of consciousness: awake and alert, patient cooperative and oriented Pain management: pain level controlled Vital Signs Assessment: post-procedure vital signs reviewed and stable Respiratory status: spontaneous breathing, nonlabored ventilation and respiratory function stable Cardiovascular status: blood pressure returned to baseline and stable Postop Assessment: no apparent nausea or vomiting Anesthetic complications: no   No complications documented.  Last Vitals:  Vitals:   03/22/21 1653 03/22/21 1708  BP: 128/84 140/70  Pulse: 70 67  Resp: 16 13  Temp:    SpO2: 99% 99%    Last Pain:  Vitals:   03/22/21 1708  TempSrc:   PainSc: 0-No pain                 Aneri Slagel,E. Siaosi Alter

## 2021-03-22 NOTE — Telephone Encounter (Signed)
Pt has checked in for procedure

## 2021-03-22 NOTE — Discharge Instructions (Signed)
Flexible Bronchoscopy, Care After This sheet gives you information about how to care for yourself after your test. Your doctor may also give you more specific instructions. If you have problems or questions, contact your doctor. Follow these instructions at home: Eating and drinking  Do not eat or drink anything (not even water) for 2 hours after your test, or until your numbing medicine (local anesthetic) wears off.  When your numbness is gone and your cough and gag reflexes have come back, you may: ? Eat only soft foods. ? Slowly drink liquids.  The day after the test, go back to your normal diet. Driving  Do not drive for 24 hours if you were given a medicine to help you relax (sedative).  Do not drive or use heavy machinery while taking prescription pain medicine. General instructions   Take over-the-counter and prescription medicines only as told by your doctor.  Return to your normal activities as told. Ask what activities are safe for you.  Do not use any products that have nicotine or tobacco in them. This includes cigarettes and e-cigarettes. If you need help quitting, ask your doctor.  Keep all follow-up visits as told by your doctor. This is important. It is very important if you had a tissue sample (biopsy) taken. Get help right away if:  You have shortness of breath that gets worse.  You get light-headed.  You feel like you are going to pass out (faint).  You have chest pain.  You cough up: ? More than a little blood. ? More blood than before. Summary  Do not eat or drink anything (not even water) for 2 hours after your test, or until your numbing medicine wears off.  Do not use cigarettes. Do not use e-cigarettes.  Get help right away if you have chest pain.   This information is not intended to replace advice given to you by your health care provider. Make sure you discuss any questions you have with your health care provider. Document Released:  08/27/2009 Document Revised: 10/12/2017 Document Reviewed: 11/17/2016 Elsevier Patient Education  2020 [employer].

## 2021-03-22 NOTE — Consult Note (Signed)
Synopsis: Referred in May 2022 for abnormal CT chest by No ref. provider found  Subjective:   PATIENT ID: Heidi Mclaughlin GENDER: female DOB: 1956-10-06, MRN: [id number]  Chief complaint: Abnormal CT chest, concern for lung cancer  This is a 65 year old female past medical history of allergy, hypertension, hyperlipidemia, mother with lung cancer.  Patient was recently seen by Dr. Marin Olp from medical oncology on 03/18/2021.  Office visit reviewed today.  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  She had cough that would not resolve ultimately had hemoptysis.  Patient was then referred for a CT scan.  CT scan on 03/14/2021 revealed a enlarged right hilar subcarinal and pretracheal nodes with a right upper lobe 5.8 cm lung mass.  Patient is a lifelong never smoker.  She does have a family history of lung cancer.  Patient's mother had lung cancer.  Patient was referred for tissue sampling via bronchoscopy.   Past Medical History:  Diagnosis Date  . Allergy   . Allergy-induced asthma   . Arthritis   . Hyperlipidemia   . Hypertension   . Irregular heart rhythm   . RLS (restless legs syndrome)      Family History  Problem Relation Age of Onset  . Cancer Mother        lung  . Hypertension Father   . Heart attack Father   . Pancreatic cancer Paternal Uncle   . Colon cancer Neg Hx   . Esophageal cancer Neg Hx   . Rectal cancer Neg Hx   . Stomach cancer Neg Hx      Past Surgical History:  Procedure Laterality Date  . ABDOMINAL HYSTERECTOMY  2003  . APPENDECTOMY  1982  . CHOLECYSTECTOMY  1982  . INCONTINENCE SURGERY  2003  . [place]  . TUBAL LIGATION  1992    Social History   Socioeconomic History  . Marital status: Married    Spouse name: Richardson Landry  . Number of children: 2  . Years of education: 6  . Highest education level: Bachelor's degree (e.g., BA, AB, BS)  Occupational History    Comment: works full time  Tobacco Use  .  Smoking status: Former Smoker    Quit date: 11/13/1972    Years since quitting: 48.3  . Smokeless tobacco: Never Used  Vaping Use  . Vaping Use: Never used  Substance and Sexual Activity  . Alcohol use: Yes    Alcohol/week: 1.0 standard drink    Types: 1 Glasses of wine per week    Comment: once a week  . Drug use: No  . Sexual activity: Not Currently    Birth control/protection: Surgical  Other Topics Concern  . Not on file  Social History Narrative   Works full-time. Lives with her husband. Likes to exercise, read and travel.    Social Determinants of Health   Financial Resource Strain: Low Risk   . Difficulty of Paying Living Expenses: Not hard at all  Food Insecurity: No Food Insecurity  . Worried About Charity fundraiser in the Last Year: Never true  . Ran Out of Food in the Last Year: Never true  Transportation Needs: No Transportation Needs  . Lack of Transportation (Medical): No  . Lack of Transportation (Non-Medical): No  Physical Activity: Sufficiently Active  . Days of Exercise per Week: 4 days  . Minutes of Exercise per Session: 60 min  Stress: No Stress Concern Present  . Feeling of Stress :  Not at all  Social Connections: Socially Integrated  . Frequency of Communication with Friends and Family: More than three times a week  . Frequency of Social Gatherings with Friends and Family: Twice a week  . Attends Religious Services: More than 4 times per year  . Active Member of Clubs or Organizations: Yes  . Attends Archivist Meetings: More than 4 times per year  . Marital Status: Married  Human resources officer Violence: Not At Risk  . Fear of Current or Ex-Partner: No  . Emotionally Abused: No  . Physically Abused: No  . Sexually Abused: No     Allergies  Allergen Reactions  . Amoxicillin Anaphylaxis  . Penicillins Anaphylaxis  . Pentazocine-Naloxone Hcl Anxiety and Other (See Comments)    insomnia    . Poison Ivy Extract [Poison Ivy Extract] Rash      @ENCMEDSTART @  Review of Systems  Constitutional: Negative for chills, fever, malaise/fatigue and weight loss.  HENT: Negative for hearing loss, sore throat and tinnitus.   Eyes: Negative for blurred vision and double vision.  Respiratory: Positive for cough, hemoptysis and sputum production. Negative for shortness of breath, wheezing and stridor.   Cardiovascular: Negative for chest pain, palpitations, orthopnea, leg swelling and PND.  Gastrointestinal: Negative for abdominal pain, constipation, diarrhea, heartburn, nausea and vomiting.  Genitourinary: Negative for dysuria, hematuria and urgency.  Musculoskeletal: Negative for joint pain and myalgias.  Skin: Negative for itching and rash.  Neurological: Negative for dizziness, tingling, weakness and headaches.  Endo/Heme/Allergies: Negative for environmental allergies. Does not bruise/bleed easily.  Psychiatric/Behavioral: Negative for depression. The patient is not nervous/anxious and does not have insomnia.   All other systems reviewed and are negative.    Objective:  Physical Exam Vitals reviewed.  Constitutional:      General: She is not in acute distress.    Appearance: She is well-developed.  HENT:     Head: Normocephalic and atraumatic.  Eyes:     General: No scleral icterus.    Conjunctiva/sclera: Conjunctivae normal.     Pupils: Pupils are equal, round, and reactive to light.  Neck:     Vascular: No JVD.     Trachea: No tracheal deviation.  Cardiovascular:     Rate and Rhythm: Normal rate and regular rhythm.     Heart sounds: Normal heart sounds. No murmur heard.   Pulmonary:     Effort: Pulmonary effort is normal. No tachypnea, accessory muscle usage or respiratory distress.     Breath sounds: No stridor. No wheezing, rhonchi or rales.  Abdominal:     General: Bowel sounds are normal. There is no distension.     Palpations: Abdomen is soft.     Tenderness: There is no abdominal tenderness.   Musculoskeletal:        General: No tenderness.     Cervical back: Neck supple.  Lymphadenopathy:     Cervical: No cervical adenopathy.  Skin:    General: Skin is warm and dry.     Capillary Refill: Capillary refill takes less than 2 seconds.     Findings: No rash.  Neurological:     Mental Status: She is alert and oriented to person, place, and time.  Psychiatric:        Behavior: Behavior normal.      Vitals:   03/21/21 1736 03/22/21 1205  BP:  116/61  Pulse:  66  Resp:  17  Temp:  98.7 F (37.1 C)  TempSrc:  Oral  SpO2:  94%  Weight: 67.6 kg 67.6 kg  Height: 5\' 2"  (1.575 m) 5\' 2"  (1.575 m)   94% on RA BMI Readings from Last 3 Encounters:  03/22/21 27.25 kg/m  03/18/21 27.44 kg/m  02/21/21 27.98 kg/m   Wt Readings from Last 3 Encounters:  03/22/21 67.6 kg  03/18/21 68 kg  02/21/21 69.4 kg     CBC    Component Value Date/Time   WBC 10.1 03/18/2021 1104   WBC 4.3 10/20/2020 0822   RBC 5.37 (H) 03/18/2021 1104   HGB 16.1 (H) 03/18/2021 1104   HCT 45.4 03/18/2021 1104   PLT 286 03/18/2021 1104   MCV 84.5 03/18/2021 1104   MCH 30.0 03/18/2021 1104   MCHC 35.5 03/18/2021 1104   RDW 12.4 03/18/2021 1104   LYMPHSABS 2.8 03/18/2021 1104   MONOABS 0.7 03/18/2021 1104   EOSABS 0.1 03/18/2021 1104   BASOSABS 0.0 03/18/2021 1104     Chest Imaging: 03/14/2021 CT chest: Mediastinal adenopathy with an associated right upper lobe 5.8 cm lung mass concerning for advanced age bronchogenic carcinoma.   Pulmonary Functions Testing Results: No flowsheet data found.  FeNO:   Pathology:   Echocardiogram:   Heart Catheterization:     Assessment & Plan:    This is a 65 year old female, family history of lung cancer, and lifelong non-smoker, large 6 cm right upper lobe mass with associated mediastinal and hilar adenopathy concerning for an advanced age bronchogenic carcinoma.  Patient presents today for discussion and plans of a video bronchoscopy with  endobronchial ultrasound and biopsy.  Discussion: We discussed today the risk benefits and alternatives of proceeding with tissue sampling and biopsy. There is no barriers to proceed at this time.  Patient is agreeable for video bronchoscopy with endobronchial ultrasound and transbronchial needle aspiration. We will have patient to follow-up with medical oncology after procedure. We will also send them pathology final report. We will plan to send tissue for appropriate markers.   Garner Nash, DO [facility] 03/22/2021 3:23 PM

## 2021-03-22 NOTE — Progress Notes (Signed)
Oncology Nurse Navigator Documentation  Oncology Nurse Navigator Flowsheets 03/22/2021  Abnormal Finding Date -  Diagnosis Status -  Navigator Follow Up Date: 03/24/2021  Navigator Follow Up Reason: Appointment Review  Navigator Location [facility]  Referral Date to RadOnc/MedOnc -  Navigator Encounter Type Appt/Treatment Plan Review  Telephone -  Patient Visit Type MedOnc  Treatment Phase Abnormal Scans  Barriers/Navigation Needs Coordination of Care;Education  Education -  Interventions None Required  Acuity Level 2-Minimal Needs (1-2 Barriers Identified)  Coordination of Care -  Education Method -  Support Groups/Services Friends and Family  Time Spent with Patient 15

## 2021-03-22 NOTE — Progress Notes (Signed)
MD at bedside to eval patient no new orders

## 2021-03-22 NOTE — Interval H&P Note (Signed)
History and Physical Interval Note:  03/22/2021 3:28 PM  Heidi Mclaughlin  has presented today for surgery, with the diagnosis of cancer.  The various methods of treatment have been discussed with the patient and family. After consideration of risks, benefits and other options for treatment, the patient has consented to  Procedure(s): [place] (N/A) as a surgical intervention.  The patient's history has been reviewed, patient examined, no change in status, stable for surgery.  I have reviewed the patient's chart and labs.  Questions were answered to the patient's satisfaction.     [place]

## 2021-03-22 NOTE — Anesthesia Procedure Notes (Signed)
Procedure Name: Intubation Date/Time: 03/22/2021 3:43 PM Performed by: Georgia Duff, CRNA Pre-anesthesia Checklist: Patient identified, Emergency Drugs available, Suction available and Patient being monitored Patient Re-evaluated:Patient Re-evaluated prior to induction Oxygen Delivery Method: Circle System Utilized Preoxygenation: Pre-oxygenation with 100% oxygen Induction Type: IV induction Ventilation: Mask ventilation without difficulty Laryngoscope Size: Miller and 2 Tube type: Oral Tube size: 8.5 mm Number of attempts: 1 Airway Equipment and Method: Stylet and Oral airway Placement Confirmation: ETT inserted through vocal cords under direct vision,  positive ETCO2 and breath sounds checked- equal and bilateral Secured at: 22 cm Tube secured with: Tape Dental Injury: Teeth and Oropharynx as per pre-operative assessment

## 2021-03-23 ENCOUNTER — Encounter (HOSPITAL_COMMUNITY): Payer: Self-pay | Admitting: Pulmonary Disease

## 2021-03-24 ENCOUNTER — Institutional Professional Consult (permissible substitution): Payer: Medicare Other | Admitting: Pulmonary Disease

## 2021-03-24 LAB — CYTOLOGY - NON PAP

## 2021-03-25 ENCOUNTER — Encounter: Payer: Self-pay | Admitting: *Deleted

## 2021-03-25 ENCOUNTER — Telehealth: Payer: Self-pay | Admitting: Pulmonary Disease

## 2021-03-25 ENCOUNTER — Encounter: Payer: Medicare Other | Admitting: Rehabilitative and Restorative Service Providers"

## 2021-03-25 DIAGNOSIS — R918 Other nonspecific abnormal finding of lung field: Secondary | ICD-10-CM

## 2021-03-25 NOTE — Progress Notes (Signed)
Per Dr Antonieta Pert request. OncotypeMAP testing sent on specimen [id number] DOS 03/22/2021  Dr Marin Olp would like to see patient once molecular testing returns for discussion of treatment. With the possibility of needing a port for systemic therapy, order placed for port, and appointment will be scheduled out, so that it may be cancelled if not needed after final pathology. IR notified of scheduling need.   Called and spoke to patient. Reviewed the path with her. Informed her of her upcoming appointment with Dr Marin Olp and the plan for port placement. She is encouraged to call us with any questions or concerns.   Oncology Nurse Navigator Documentation  Oncology Nurse Navigator Flowsheets 03/25/2021  Abnormal Finding Date -  Confirmed Diagnosis Date 03/22/2021  Diagnosis Status Additional Work Up  Navigator Follow Up Date: 03/29/2021  Navigator Follow Up Reason: Scan Review  Navigator Location [facility]  Referral Date to RadOnc/MedOnc -  Navigator Encounter Type Molecular Studies;Pathology Review;Telephone  Telephone Appt Confirmation/Clarification;Diagnostic Results;Outgoing Call  Patient Visit Type MedOnc  Treatment Phase Pre-Tx/Tx Discussion  Barriers/Navigation Needs Coordination of Care;Education  Education Other  Interventions Coordination of Care;Education;Psycho-Social Support;Other  Acuity Level 2-Minimal Needs (1-2 Barriers Identified)  Coordination of Care Appts;Pathology  Education Method Verbal  Support Groups/Services Friends and Family  Time Spent with Patient 63

## 2021-03-25 NOTE — Progress Notes (Signed)
Mandi, I tried calling the patient to let her know of the pathology results consistent with Non-small cell lung cancer. She has an appt with Dr. Marin Olp.   Thanks,  BLI  Garner Nash, DO [facility] 03/25/2021 12:26 PM

## 2021-03-25 NOTE — Telephone Encounter (Signed)
I called and spoke with patient in regards to phone call. Looks like Dr. Valeta Harms had tried to call the patient about the pathology results. I went over the results and patient verbalized understanding, nothing further needed.

## 2021-03-26 ENCOUNTER — Ambulatory Visit (HOSPITAL_BASED_OUTPATIENT_CLINIC_OR_DEPARTMENT_OTHER)
Admission: RE | Admit: 2021-03-26 | Discharge: 2021-03-26 | Disposition: A | Discharge status: Home / Self Care | Payer: Medicare Other | Source: Ambulatory Visit | Attending: Hematology & Oncology | Admitting: Hematology & Oncology

## 2021-03-26 ENCOUNTER — Other Ambulatory Visit: Payer: Self-pay

## 2021-03-26 ENCOUNTER — Other Ambulatory Visit: Payer: Self-pay | Admitting: Oncology

## 2021-03-26 DIAGNOSIS — G936 Cerebral edema: Secondary | ICD-10-CM | POA: Diagnosis not present

## 2021-03-26 DIAGNOSIS — R918 Other nonspecific abnormal finding of lung field: Secondary | ICD-10-CM | POA: Insufficient documentation

## 2021-03-26 DIAGNOSIS — C7931 Secondary malignant neoplasm of brain: Secondary | ICD-10-CM

## 2021-03-26 DIAGNOSIS — G9389 Other specified disorders of brain: Secondary | ICD-10-CM | POA: Diagnosis not present

## 2021-03-26 MED ORDER — GADOBUTROL 1 MMOL/ML IV SOLN
6.5000 mL | Freq: Once | INTRAVENOUS | Status: AC | PRN
  Administered 2021-03-26: 6.5 mL via INTRAVENOUS

## 2021-03-26 MED ORDER — DEXAMETHASONE 4 MG PO TABS: 4.0000 mg | ORAL_TABLET | Freq: Two times a day (BID) | ORAL | 1 refills | Status: DC

## 2021-03-26 NOTE — Progress Notes (Signed)
The results of the MRI noted and reviewed personally.  This was discussed with the patient today in detail via phone.  She is not experiencing any neurological deficits.  She has not reported any headaches blurry vision, syncope or any other neurological deficits.    Because of the moderate vasogenic edema I have recommended starting on dexamethasone 4 mg twice daily and will have an urgent referral to radiation oncology to address her brain metastasis.  I gave her clear instructions to report to the emergency department if she is to develop any neurological symptoms.  She understands and will start the dexamethasone today.

## 2021-03-28 NOTE — Progress Notes (Signed)
Location/Histology of Brain Tumor: Lung cancer metastatic to brain  Patient presented with cough that was unresolved after course of antibiotics.  She then developed hemoptysis.  She was seen by her PCP who ordered CT scan.  PET 03/30/2021:  MRI Brain 03/26/2021:  1. 5 mm in the lower right cerebellum on 11:29 2. Right anterior and medial temporal lobe measuring 23 mm on 11:60. 3. 18 mm in the left occipital lobe on 11:64. 4. 5 mm in the right caudate head on 11:95 5. 15 mm in the parasagittal left occipital parietal sulcus measured on 11:108 6. 14 mm in the parasagittal left parietal lobe measured on 11:122. 7. 9 mm in the high left parietal cortex on 11:135. 8. 9 mm in the right occipital cortex on 11:99 9. 3 mm in the lateral right frontal cortex on 11:101, subtle but confirmed on coronal postcontrast imaging. 10. Equivocal 2 mm metastasis in the left occipital lobe on series 12, image 3, not clearly localized on axial thin section images but this could be from differences in contrast timing  CT Chest 03/14/2021: There is a dense, masslike consolidation of the suprahilar right upper lobe, measuring 5.8 x 5.1 x 3.6 cm. This occludes the apical segmental bronchus of the right upper lobe. There is postobstructive consolidation and nodularity throughout the right upper lobe.  There are numerous bilateral pulmonary nodules and opacities of varying sizes throughout the lungs.  Enlarged right hilar, subcarinal, and pretracheal lymph nodes.    Biopsies RUL Lung 03/22/2021   Lymph Node 03/22/2021    Past or anticipated interventions, if any, per neurosurgery:   Past or anticipated interventions, if any, per medical oncology:  Dr. Marin Olp 04/06/2021  Dose of Decadron, if applicable: 4 mg BID  Recent neurologic symptoms, if any:   Seizures: No  Headaches: No  Nausea: No  Dizziness/ataxia: No  Difficulty with hand coordination: No  Focal numbness/weakness: No  Visual  deficits/changes: No  Confusion/Memory deficits: No   SAFETY ISSUES:  Prior radiation? No  Pacemaker/ICD? No   Possible current pregnancy? Hysterectomy  Is the patient on methotrexate? No  Additional Complaints / other details:

## 2021-03-29 ENCOUNTER — Ambulatory Visit
Admission: RE | Admit: 2021-03-29 | Discharge: 2021-03-29 | Disposition: A | Discharge status: Home / Self Care | Payer: Medicare Other | Source: Ambulatory Visit | Attending: Radiation Oncology | Admitting: Radiation Oncology

## 2021-03-29 ENCOUNTER — Ambulatory Visit: Payer: Medicare Other

## 2021-03-29 ENCOUNTER — Encounter: Payer: Self-pay | Admitting: *Deleted

## 2021-03-29 ENCOUNTER — Encounter: Payer: Self-pay | Admitting: Radiation Oncology

## 2021-03-29 ENCOUNTER — Ambulatory Visit: Payer: Medicare Other | Admitting: Radiation Oncology

## 2021-03-29 ENCOUNTER — Other Ambulatory Visit: Payer: Self-pay | Admitting: Radiation Therapy

## 2021-03-29 ENCOUNTER — Other Ambulatory Visit: Payer: Self-pay

## 2021-03-29 VITALS — BP 124/81 | HR 74 | Temp 97.8°F | Resp 18 | Ht 62.0 in | Wt 151.2 lb

## 2021-03-29 DIAGNOSIS — M4317 Spondylolisthesis, lumbosacral region: Secondary | ICD-10-CM | POA: Insufficient documentation

## 2021-03-29 DIAGNOSIS — Z801 Family history of malignant neoplasm of trachea, bronchus and lung: Secondary | ICD-10-CM | POA: Diagnosis not present

## 2021-03-29 DIAGNOSIS — I2510 Atherosclerotic heart disease of native coronary artery without angina pectoris: Secondary | ICD-10-CM | POA: Diagnosis not present

## 2021-03-29 DIAGNOSIS — R799 Abnormal finding of blood chemistry, unspecified: Secondary | ICD-10-CM | POA: Insufficient documentation

## 2021-03-29 DIAGNOSIS — E785 Hyperlipidemia, unspecified: Secondary | ICD-10-CM | POA: Diagnosis not present

## 2021-03-29 DIAGNOSIS — Z7982 Long term (current) use of aspirin: Secondary | ICD-10-CM | POA: Insufficient documentation

## 2021-03-29 DIAGNOSIS — C7931 Secondary malignant neoplasm of brain: Secondary | ICD-10-CM

## 2021-03-29 DIAGNOSIS — Z79899 Other long term (current) drug therapy: Secondary | ICD-10-CM | POA: Insufficient documentation

## 2021-03-29 DIAGNOSIS — G2581 Restless legs syndrome: Secondary | ICD-10-CM | POA: Diagnosis not present

## 2021-03-29 DIAGNOSIS — Z800 Family history of malignant neoplasm of digestive organs: Secondary | ICD-10-CM | POA: Diagnosis not present

## 2021-03-29 DIAGNOSIS — C3411 Malignant neoplasm of upper lobe, right bronchus or lung: Secondary | ICD-10-CM

## 2021-03-29 DIAGNOSIS — R590 Localized enlarged lymph nodes: Secondary | ICD-10-CM | POA: Diagnosis not present

## 2021-03-29 DIAGNOSIS — Z87891 Personal history of nicotine dependence: Secondary | ICD-10-CM | POA: Insufficient documentation

## 2021-03-29 DIAGNOSIS — C7949 Secondary malignant neoplasm of other parts of nervous system: Secondary | ICD-10-CM

## 2021-03-29 DIAGNOSIS — Z803 Family history of malignant neoplasm of breast: Secondary | ICD-10-CM | POA: Insufficient documentation

## 2021-03-29 DIAGNOSIS — Z8781 Personal history of (healed) traumatic fracture: Secondary | ICD-10-CM | POA: Diagnosis not present

## 2021-03-29 NOTE — Progress Notes (Signed)
Radiation Oncology         [phone] ________________________________  Name: Heidi Mclaughlin        MRN: [id number]  Date of Service: 03/29/2021 DOB: 1957/11/67  YO:[id number], Gwen Her, MD  Wyatt Portela, MD     REFERRING PHYSICIAN: Wyatt Portela, MD   DIAGNOSIS: The primary encounter diagnosis was Secondary malignant neoplasm of brain [facility]). Diagnoses of Malignant neoplasm of upper lobe of right lung ([place]) and Brain metastases ([place]) were also pertinent to this visit.   HISTORY OF PRESENT ILLNESS: Heidi Mclaughlin is a 65 y.o. female seen at the request of Dr. Alen Blew for a new diagnosis of metastatic lung cancer.  The patient was being worked up for a cough, it did not improve after treatment with antibiotics and her PCP recommended CT imaging of the chest. This was performed on 03/14/2021 revealing a dense masslike consolidation in the suprahilar right upper lobe measuring up to 5.8 cm occluding the apical segmental bronchus in the right upper lobe there was postobstructive consolidation and nodularity throughout the lobe.  Numerous bilateral nodules and opacities throughout the lungs were seen as well as right hilar subcarinal and pretracheal lymph nodes.  She underwent bronchoscopy on 03/22/2021 with Dr. Jamey Ripa, final pathology from the station 7 lymph node fine-needle aspirate showed non-small cell carcinoma, adenocarcinoma this was also seen in the right upper lobe brushing and right upper lobe lung biopsy.  She met with Dr. Marin Olp in the midst of her work-up, and he had recommended continuing her work-up with PET scan and MRI of the brain.  Her PET scan is scheduled for tomorrow, and MRI of the brain with and without contrast on 03/26/2021 unfortunately showed a total of 9 lesions possibly 10, measuring up to 2.3 cm in greatest dimension, the larger lesions were hemorrhagic with moderate vasogenic edema and she was started on dexamethasone by Dr. Alen Blew who was on-call.  She has been  taking dexamethasone 4 mg twice daily.  She is seen today to discuss treatment recommendations with radiotherapy for her brain disease.    PREVIOUS RADIATION THERAPY: No   PAST MEDICAL HISTORY:  Past Medical History:  Diagnosis Date  . Allergy   . Allergy-induced asthma   . Arthritis   . Hyperlipidemia   . Hypertension   . Irregular heart rhythm   . RLS (restless legs syndrome)        PAST SURGICAL HISTORY: Past Surgical History:  Procedure Laterality Date  . ABDOMINAL HYSTERECTOMY  2003  . APPENDECTOMY  1982  . BRONCHIAL BIOPSY  03/22/2021   Procedure: BRONCHIAL BIOPSIES;  Surgeon: Garner Nash, DO;  Location: [facility];  Service: Pulmonary;;  . BRONCHIAL BRUSHINGS  03/22/2021   Procedure: BRONCHIAL BRUSHINGS;  Surgeon: Garner Nash, DO;  Location: [facility];  Service: Pulmonary;;  . BRONCHIAL NEEDLE ASPIRATION BIOPSY  03/22/2021   Procedure: BRONCHIAL NEEDLE ASPIRATION BIOPSIES;  Surgeon: Garner Nash, DO;  Location: [facility];  Service: Pulmonary;;  . BRONCHIAL WASHINGS  03/22/2021   Procedure: BRONCHIAL WASHINGS;  Surgeon: Garner Nash, DO;  Location: [facility] [facility];  Service: Pulmonary;;  . CHOLECYSTECTOMY  1982  . EYELID LACERATION REPAIR  2021  . INCONTINENCE SURGERY  2003  . [place]  . TUBAL LIGATION  1992  . VIDEO BRONCHOSCOPY WITH ENDOBRONCHIAL ULTRASOUND N/A 03/22/2021   Procedure: VIDEO BRONCHOSCOPY WITH ENDOBRONCHIAL ULTRASOUND;  Surgeon: Garner Nash, DO;  Location: [facility];  Service: Pulmonary;  Laterality: N/A;  FAMILY HISTORY:  Family History  Problem Relation Age of Onset  . Cancer Mother        lung  . Hypertension Father   . Heart attack Father   . Pancreatic cancer Paternal Uncle   . Breast cancer Sister   . Colon cancer Neg Hx   . Esophageal cancer Neg Hx   . Rectal cancer Neg Hx   . Stomach cancer Neg Hx      SOCIAL HISTORY:  reports that she quit smoking about 48 years ago.  She has never used smokeless tobacco. She reports current alcohol use of about 1.0 standard drink of alcohol per week. She reports that she does not use drugs. 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    ALLERGIES: Amoxicillin, Penicillins, Pentazocine-naloxone hcl, and Poison ivy extract [poison ivy extract]   MEDICATIONS:  Current Outpatient Medications  Medication Sig Dispense Refill  . aspirin EC 81 MG tablet Take 81 mg by mouth daily. Swallow whole.    . cetirizine (ZYRTEC) 10 MG tablet Take 10 mg by mouth daily.    Marland Kitchen CINNAMON PO Take 1,000 mg by mouth daily.    Marland Kitchen dexamethasone (DECADRON) 4 MG tablet Take 1 tablet (4 mg total) by mouth 2 (two) times daily. 30 tablet 1  . fluticasone (FLONASE) 50 MCG/ACT nasal spray Place 2 sprays into both nostrils 2 (two) times daily as needed for allergies or rhinitis.    . fluticasone (FLOVENT HFA) 110 MCG/ACT inhaler Inhale 1 puff into the lungs in the morning and at bedtime. 1 each 12  . folic acid (FOLVITE) 1 MG tablet TAKE 1 TABLET BY MOUTH EVERY DAY (Patient taking differently: Take 1 mg by mouth daily.) 90 tablet 2  . hydrochlorothiazide (HYDRODIURIL) 25 MG tablet TAKE 1 TABLET BY MOUTH EVERY DAY (Patient taking differently: Take 25 mg by mouth daily.) 90 tablet 3  . ketotifen (ZADITOR) 0.025 % ophthalmic solution Place 1 drop into both eyes 2 (two) times daily as needed (allergies).    Marland Kitchen Lysine 1000 MG TABS Take 1,000 mg by mouth daily.    . montelukast (SINGULAIR) 10 MG tablet TAKE 1 TABLET BY MOUTH EVERYDAY AT BEDTIME (Patient taking differently: Take 10 mg by mouth at bedtime.) 90 tablet 1  . Multiple Minerals-Vitamins (CAL-MAG-ZINC-D) TABS Take 1 tablet by mouth daily.    . Multiple Vitamin (MULTIVITAMIN WITH MINERALS) TABS tablet Take 1 tablet by mouth daily.    . Omega-3 Fatty Acids (FISH OIL ULTRA) 1400 MG CAPS Take 1,400 mg by mouth daily.    Vladimir Faster Glycol-Propyl Glycol (SYSTANE) 0.4-0.3 % SOLN Place 1 drop into  both eyes daily as needed (allergies).    Marland Kitchen rOPINIRole (REQUIP) 1 MG tablet TAKE 1 TABLET (1 MG TOTAL) BY MOUTH AT BEDTIME. 90 tablet 1  . simvastatin (ZOCOR) 40 MG tablet TAKE 1 TABLET BY MOUTH EVERY DAY (Patient taking differently: Take 40 mg by mouth daily.) 90 tablet 3  . valACYclovir (VALTREX) 500 MG tablet TAKE 1 TABLET (500 MG TOTAL) BY MOUTH AS NEEDED. (Patient taking differently: Take 500 mg by mouth daily as needed (fever blisters).) 90 tablet 0  . vitamin C (ASCORBIC ACID) 500 MG tablet Take 500 mg by mouth 2 (two) times daily.     No current facility-administered medications for this encounter.     REVIEW OF SYSTEMS: On review of systems, the patient reports that she is doing well overall. She denies any confusion or dizziness, nausea, or changes in  speech or vision. She denies any chest pain, shortness of breath, cough, fevers, chills, night sweats, unintended weight changes. She denies any bowel or bladder disturbances, and denies abdominal pain, nausea or vomiting. She denies any new musculoskeletal or joint aches or pains. A complete review of systems is obtained and is otherwise negative.     PHYSICAL EXAM:  Wt Readings from Last 3 Encounters:  03/29/21 151 lb 3.2 oz (68.6 kg)  03/22/21 149 lb (67.6 kg)  03/18/21 150 lb (68 kg)   Temp Readings from Last 3 Encounters:  03/29/21 97.8 F (36.6 C)  03/22/21 97.8 F (36.6 C)  03/18/21 98.5 F (36.9 C) (Oral)   BP Readings from Last 3 Encounters:  03/29/21 124/81  03/22/21 136/84  03/18/21 130/82   Pulse Readings from Last 3 Encounters:  03/29/21 74  03/22/21 71  03/18/21 90   Pain Assessment Pain Score: 0-No pain/10  In general this is a well appearing caucasian female in no acute distress. She's alert and oriented x4 and appropriate throughout the examination. Cardiopulmonary assessment is negative for acute distress and she exhibits normal effort.    ECOG = 0  0 - Asymptomatic (Fully active, able to carry  on all predisease activities without restriction)  1 - Symptomatic but completely ambulatory (Restricted in physically strenuous activity but ambulatory and able to carry out work of a light or sedentary nature. For example, light housework, office work)  2 - Symptomatic, <50% in bed during the day (Ambulatory and capable of all self care but unable to carry out any work activities. Up and about more than 50% of waking hours)  3 - Symptomatic, >50% in bed, but not bedbound (Capable of only limited self-care, confined to bed or chair 50% or more of waking hours)  4 - Bedbound (Completely disabled. Cannot carry on any self-care. Totally confined to bed or chair)  5 - Death   Eustace Pen MM, Creech RH, Tormey DC, et al. [phone]). "Toxicity and response criteria of the [facility]". [place] Oncol. 5 (6): 649-55    LABORATORY DATA:  Lab Results  Component Value Date   WBC 10.1 03/18/2021   HGB 16.1 (H) 03/18/2021   HCT 45.4 03/18/2021   MCV 84.5 03/18/2021   PLT 286 03/18/2021   Lab Results  Component Value Date   NA 135 03/18/2021   K 3.2 (L) 03/18/2021   CL 98 03/18/2021   CO2 27 03/18/2021   Lab Results  Component Value Date   ALT 17 03/18/2021   AST 14 (L) 03/18/2021   ALKPHOS 49 03/18/2021   BILITOT 0.7 03/18/2021      RADIOGRAPHY: DG Chest 2 View  Result Date: 03/07/2021 CLINICAL DATA:  65 year old female with history of pneumonia EXAM: CHEST - 2 VIEW COMPARISON:  12/30/2020 FINDINGS: Cardiomediastinal silhouette unchanged. Persisting airspace opacity of the right upper lobe, slightly worse in appearance than the comparison chest x-ray. No pneumothorax. No pleural effusion. No acute displaced fracture.  Degenerative changes of the spine. IMPRESSION: Persisting airspace opacity in the right upper lobe, appears slightly worse than the comparison. While the favored etiology is lobar pneumonia, further evaluation with contrast-enhanced chest CT is  recommended at this time given the worsened appearance. Electronically Signed   By: Corrie Mckusick D.O.   On: 03/07/2021 13:24   CT Chest W Contrast  Result Date: 03/15/2021 CLINICAL DATA:  Persistent, worsening right upper lobe infiltrate EXAM: CT CHEST WITH CONTRAST TECHNIQUE: Multidetector CT imaging of the  chest was performed during intravenous contrast administration. CONTRAST:  52m OMNIPAQUE IOHEXOL 300 MG/ML  SOLN COMPARISON:  Chest radiographs, 03/07/2021 FINDINGS: Cardiovascular: No significant vascular findings. Normal heart size. Scattered left coronary artery calcifications. No pericardial effusion. Mediastinum/Nodes: Enlarged right hilar, subcarinal, and pretracheal lymph nodes, largest pretracheal node measuring 1.8 x 1.2 cm (series 2, image 43). Thyroid gland, trachea, and esophagus demonstrate no significant findings. Lungs/Pleura: There is a dense, masslike consolidation of the suprahilar right upper lobe, measuring 5.8 x 5.1 x 3.6 cm (series 2, image 41, series 5, image 52). This occludes the apical segmental bronchus of the right upper lobe (series 5, image 50). There is postobstructive consolidation and nodularity throughout the right upper lobe, and there are numerous bilateral pulmonary nodules and opacities of varying sizes, largest index nodule of the left lower lobe measuring 1.2 x 1.2 cm (series 3, image 85). No pleural effusion or pneumothorax. Upper Abdomen: No acute abnormality. Musculoskeletal: No chest wall mass or suspicious bone lesions identified. IMPRESSION: 1. There is a dense, masslike consolidation of the suprahilar right upper lobe, measuring 5.8 x 5.1 x 3.6 cm. This occludes the apical segmental bronchus of the right upper lobe. There is postobstructive consolidation and nodularity throughout the right upper lobe. 2. There are numerous bilateral pulmonary nodules and opacities of varying sizes throughout the lungs. 3. Enlarged right hilar, subcarinal, and pretracheal lymph  nodes. 4. Findings are most consistent with primary lung malignancy, nodal metastatic disease, and bilateral pulmonary metastatic disease. 5. Coronary artery disease. These results will be called to the ordering clinician or representative by the Radiologist Assistant, and communication documented in the PACS or [employer] Electronically Signed   By: AEddie CandleM.D.   On: 03/15/2021 10:57   MR Brain W Wo Contrast  Result Date: 03/26/2021 CLINICAL DATA:  Lung cancer staging EXAM: MRI HEAD WITHOUT AND WITH CONTRAST TECHNIQUE: Multiplanar, multiecho pulse sequences of the brain and surrounding structures were obtained without and with intravenous contrast. CONTRAST:  6.557mGADAVIST GADOBUTROL 1 MMOL/ML IV SOLN COMPARISON:  None. FINDINGS: Brain: Multiple enhancing brain masses consistent with metastatic disease. The larger are necrotic/cystic with hemorrhagic components. Specific lesions: 1. 5 mm in the lower right cerebellum on 11:29 2. Right anterior and medial temporal lobe measuring 23 mm on 11:60. 3. 18 mm in the left occipital lobe on 11:64. 4. 5 mm in the right caudate head on 11:95 5. 15 mm in the parasagittal left occipital parietal sulcus measured on 11:108 6. 14 mm in the parasagittal left parietal lobe measured on 11:122. 7. 9 mm in the high left parietal cortex on 11:135. 8. 9 mm in the right occipital cortex on 11:99 9. 3 mm in the lateral right frontal cortex on 11:101, subtle but confirmed on coronal postcontrast imaging. 10. Equivocal 2 mm metastasis in the left occipital lobe on series 12, image 3, not clearly localized on axial thin section images but this could be from differences in contrast timing Up to moderate vasogenic edema associated with the larger lesions, primarily in the left occipital lobe. No midline shift or herniation. No incidental infarct, hydrocephalus, or collection. Vascular: Preserved flow voids and vascular enhancements Skull and upper cervical spine: Normal marrow  signal Sinuses/Orbits: Negative These results will be called to the ordering clinician or representative by the Radiologist Assistant, and communication documented in the PACS or [employer] CorporationIMPRESSION: At least 9 (and possibly 10) brain metastases measuring up to 2.3 cm. The larger lesions are hemorrhagic with up to  moderate vasogenic edema. Electronically Signed   By: Monte Fantasia M.D.   On: 03/26/2021 09:48   DG Lumb Spine Flex&Ext Only  Result Date: 03/08/2021 CLINICAL DATA:  Evaluation of spondylolisthesis. EXAM: LUMBAR SPINE FLEX AND EXTEND ONLY - 2-3 VIEW COMPARISON:  Lumbar spine 10/04/2015. FINDINGS: Lumbar spine numbered as per prior exam. Stable grade 1 anterolisthesis L5 on S1. No change on flexion or extension. Diffuse mild thoracolumbar degenerative change. No acute bony abnormality identified. IMPRESSION: 1. Stable grade 1 anterolisthesis L5 on S1. No change on flexion or extension. 2. Diffuse mild thoracolumbar spine degenerative change. No acute bony abnormality. Electronically Signed   By: Marcello Moores  Register   On: 03/08/2021 07:54   DG CHEST PORT 1 VIEW  Result Date: 03/22/2021 CLINICAL DATA:  Postop bronchoscopy. EXAM: PORTABLE CHEST 1 VIEW COMPARISON:  03/07/2021 FINDINGS: Right suprahilar mass lesion again noted. Several nodular opacities are seen at the left base. No evidence for pneumothorax. No pleural effusion. The cardiopericardial silhouette is within normal limits for size. Telemetry leads overlie the chest. IMPRESSION: 1. No evidence for pneumothorax. 2. Bilateral pulmonary nodules. Electronically Signed   By: Misty Stanley M.D.   On: 03/22/2021 17:23       IMPRESSION/PLAN: 1. Stage IV, NSCLC, adenocarcinoma of the RUL with brain disease.  Dr. Lisbeth Renshaw discusses the findings and from her recent imaging and recommends continued work-up with PET scan.  She may be a candidate for stereotactic radiosurgery [facility]) plus or minus preoperative treatment given the size of her  disease.  He recommends continued dexamethasone, and 3T imaging to determine if she is a candidate for SRS versus whole brain radiation. We discussed the risks, benefits, short, and long term effects of radiotherapy, as well as the curative intent, and the patient is interested in proceeding. Dr. Lisbeth Renshaw discusses the delivery and logistics of radiotherapy and anticipates a course of fractionated treatment up to 3 total fractions versus single fraction treatment with preoperative radiosurgery versus whole brain radiation. She will follow up Dr. Marin Olp next week to discuss plans for systemic treatment. We will follow up with her 3T MRI when available and then make more definitive plans, but our special procedures navigator will also coordinate discussion at brain oncology conference, appointment with Dr. Kathyrn Sheriff who is aware of her case if SRS is decided upon, and simulation on Tuesday next week. Dr. Lisbeth Renshaw also discussed the option of palliative radiotherapy to the chest given the size and location of her disease. While she is overall minimally symptomatic, we will make final treatment decisions regarding this after tomorrow's PET scan. The patient is in agreement with this plan.  In a visit lasting 60 minutes, greater than 50% of the time was spent face to face discussing the patient's condition, in preparation for the discussion, and coordinating the patient's care.   The above documentation reflects my direct findings during this shared patient visit. Please see the separate note by Dr. Lisbeth Renshaw on this date for the remainder of the patient's plan of care.    Carola Rhine, [facility]   **Disclaimer: This note was dictated with voice recognition software. Similar sounding words can inadvertently be transcribed and this note may contain transcription errors which may not have been corrected upon publication of note.**

## 2021-03-29 NOTE — Progress Notes (Signed)
Patient had her MRI which was positive for brain mets. Started on dex by on-call MD and referral placed for radiation oncology. Patient is seeing them this morning.   MyChart message sent to patient encouraging her to reach out with any needs and to confirm continued plan per Dr Marin Olp.   PET is scheduled for tomorrow.   Oncology Nurse Navigator Documentation  Oncology Nurse Navigator Flowsheets 03/29/2021  Abnormal Finding Date -  Confirmed Diagnosis Date -  Diagnosis Status -  Navigator Follow Up Date: 03/30/2021  Navigator Follow Up Reason: Scan Review  Navigator Location [facility]  Referral Date to RadOnc/MedOnc -  Navigator Encounter Type Appt/Treatment Plan Review;MyChart  Telephone -  Patient Visit Type MedOnc  Treatment Phase Pre-Tx/Tx Discussion  Barriers/Navigation Needs Coordination of Care;Education  Education Other  Interventions Education;Psycho-Social Support  Acuity Level 2-Minimal Needs (1-2 Barriers Identified)  Coordination of Care -  Education Method Written  Support Groups/Services Friends and Family  Time Spent with Patient 30

## 2021-03-30 ENCOUNTER — Other Ambulatory Visit: Payer: Medicare Other

## 2021-03-30 ENCOUNTER — Ambulatory Visit (HOSPITAL_COMMUNITY)
Admission: RE | Admit: 2021-03-30 | Discharge: 2021-03-30 | Disposition: A | Discharge status: Home / Self Care | Payer: Medicare Other | Source: Ambulatory Visit | Attending: Hematology & Oncology | Admitting: Hematology & Oncology

## 2021-03-30 DIAGNOSIS — C3492 Malignant neoplasm of unspecified part of left bronchus or lung: Secondary | ICD-10-CM | POA: Diagnosis not present

## 2021-03-30 DIAGNOSIS — R918 Other nonspecific abnormal finding of lung field: Secondary | ICD-10-CM | POA: Insufficient documentation

## 2021-03-30 DIAGNOSIS — R599 Enlarged lymph nodes, unspecified: Secondary | ICD-10-CM | POA: Diagnosis not present

## 2021-03-30 DIAGNOSIS — C3491 Malignant neoplasm of unspecified part of right bronchus or lung: Secondary | ICD-10-CM | POA: Diagnosis not present

## 2021-03-30 DIAGNOSIS — C3490 Malignant neoplasm of unspecified part of unspecified bronchus or lung: Secondary | ICD-10-CM | POA: Diagnosis not present

## 2021-03-30 LAB — GLUCOSE, CAPILLARY: Glucose-Capillary: 107 mg/dL — ABNORMAL HIGH (ref 70–99)

## 2021-03-30 MED ORDER — FLUDEOXYGLUCOSE F - 18 (FDG) INJECTION
7.8000 | Freq: Once | INTRAVENOUS | Status: AC | PRN
  Administered 2021-03-30: 8.6 via INTRAVENOUS

## 2021-03-31 ENCOUNTER — Encounter: Payer: Self-pay | Admitting: *Deleted

## 2021-03-31 ENCOUNTER — Other Ambulatory Visit: Payer: Self-pay | Admitting: *Deleted

## 2021-03-31 NOTE — Progress Notes (Signed)
The proposed treatment discussed in cancer conference is for discussion purpose only and is not a binding recommendation. The patient was not physically examined nor present for their treatment options. Therefore, final treatment plans cannot be decided.  ?

## 2021-04-01 ENCOUNTER — Ambulatory Visit
Admission: RE | Admit: 2021-04-01 | Discharge: 2021-04-01 | Disposition: A | Discharge status: Home / Self Care | Payer: Medicare Other | Source: Ambulatory Visit | Attending: Radiation Oncology | Admitting: Radiation Oncology

## 2021-04-01 ENCOUNTER — Encounter: Payer: Self-pay | Admitting: *Deleted

## 2021-04-01 ENCOUNTER — Other Ambulatory Visit: Payer: Self-pay

## 2021-04-01 DIAGNOSIS — I611 Nontraumatic intracerebral hemorrhage in hemisphere, cortical: Secondary | ICD-10-CM | POA: Diagnosis not present

## 2021-04-01 DIAGNOSIS — Q283 Other malformations of cerebral vessels: Secondary | ICD-10-CM | POA: Diagnosis not present

## 2021-04-01 DIAGNOSIS — Z85118 Personal history of other malignant neoplasm of bronchus and lung: Secondary | ICD-10-CM | POA: Diagnosis not present

## 2021-04-01 DIAGNOSIS — C7931 Secondary malignant neoplasm of brain: Secondary | ICD-10-CM

## 2021-04-01 DIAGNOSIS — C7949 Secondary malignant neoplasm of other parts of nervous system: Secondary | ICD-10-CM

## 2021-04-01 MED ORDER — GADOBENATE DIMEGLUMINE 529 MG/ML IV SOLN
15.0000 mL | Freq: Once | INTRAVENOUS | Status: AC | PRN
  Administered 2021-04-01: 15 mL via INTRAVENOUS

## 2021-04-01 NOTE — Progress Notes (Signed)
Oncology Nurse Navigator Documentation  Oncology Nurse Navigator Flowsheets 04/01/2021  Abnormal Finding Date -  Confirmed Diagnosis Date -  Diagnosis Status -  Navigator Follow Up Date: 04/06/2021  Navigator Follow Up Reason: Follow-up Appointment  Navigator Location [facility]  Referral Date to RadOnc/MedOnc -  Navigator Encounter Type Scan Review;MyChart  Telephone -  Patient Visit Type MedOnc  Treatment Phase Pre-Tx/Tx Discussion  Barriers/Navigation Needs Coordination of Care;Education  Education Other  Interventions Education;Psycho-Social Support  Acuity Level 2-Minimal Needs (1-2 Barriers Identified)  Coordination of Care -  Education Method Written  Support Groups/Services Friends and Family  Time Spent with Patient 30

## 2021-04-04 ENCOUNTER — Inpatient Hospital Stay: Payer: Medicare Other

## 2021-04-04 ENCOUNTER — Telehealth: Payer: Self-pay | Admitting: Radiation Oncology

## 2021-04-04 NOTE — Progress Notes (Incomplete)
Has armband been applied?    Does patient have an allergy to IV contrast dye?: No   Has patient ever received premedication for IV contrast dye?:    Does patient take metformin?: No  If patient does take metformin when was the last dose:   Date of lab work: 03/18/2021 BUN: 17 CR: 0.89 eGFR: >60  IV site:   Has IV site been added to flowsheet?

## 2021-04-04 NOTE — Telephone Encounter (Signed)
I called and spoke with the patient regarding our discussion in brain oncology conference this am. Her PET also showed changes in her thyroid and Dr. Marin Olp plans biopsy to determine if a synchronous or similar problem to the findings in the lung. She has 14 lesions in the brain and is a candidate for whole brain radiation, versus enrollment in a clinical trial comparing SRS versus whole brain radiation with namenda and hippocampal sparing. She is interested in enrolling in the trial and understands she will be randomized into one of the two arms. She is still interested in simulation to the chest tomorrow even knowing that her brain simulation may need to be moved in order to enroll in the trial then become randomized. I've messaged the providers involved in her care and in the research department.

## 2021-04-05 ENCOUNTER — Ambulatory Visit
Admission: RE | Admit: 2021-04-05 | Discharge: 2021-04-05 | Disposition: A | Discharge status: Home / Self Care | Payer: Medicare Other | Source: Ambulatory Visit | Attending: Radiation Oncology | Admitting: Radiation Oncology

## 2021-04-05 ENCOUNTER — Other Ambulatory Visit: Payer: Self-pay

## 2021-04-05 ENCOUNTER — Ambulatory Visit: Payer: Medicare Other | Admitting: Radiation Oncology

## 2021-04-05 ENCOUNTER — Encounter: Payer: Self-pay | Admitting: Radiation Oncology

## 2021-04-05 ENCOUNTER — Encounter: Payer: Self-pay | Admitting: Radiology

## 2021-04-05 ENCOUNTER — Ambulatory Visit: Admission: RE | Admit: 2021-04-05 | Payer: Medicare Other | Source: Ambulatory Visit

## 2021-04-05 ENCOUNTER — Other Ambulatory Visit: Payer: Self-pay | Admitting: *Deleted

## 2021-04-05 DIAGNOSIS — Z87891 Personal history of nicotine dependence: Secondary | ICD-10-CM | POA: Diagnosis not present

## 2021-04-05 DIAGNOSIS — C3411 Malignant neoplasm of upper lobe, right bronchus or lung: Secondary | ICD-10-CM | POA: Insufficient documentation

## 2021-04-05 DIAGNOSIS — C7931 Secondary malignant neoplasm of brain: Secondary | ICD-10-CM

## 2021-04-05 DIAGNOSIS — R918 Other nonspecific abnormal finding of lung field: Secondary | ICD-10-CM

## 2021-04-05 DIAGNOSIS — Z510 Encounter for antineoplastic radiation therapy: Secondary | ICD-10-CM | POA: Insufficient documentation

## 2021-04-05 DIAGNOSIS — C3490 Malignant neoplasm of unspecified part of unspecified bronchus or lung: Secondary | ICD-10-CM | POA: Diagnosis not present

## 2021-04-05 NOTE — Progress Notes (Signed)
I met with the patient this morning to update her consent to include the chest. She is also interesting in signing consent if whole brain treatment with hippocampal sparing is where she gets randomized to. She is right handed and signs written consent on her own.     Carola Rhine, PAC

## 2021-04-05 NOTE — Research (Signed)
CE.7: A PHASE III TRIAL OF STEREOTACTIC RADIOSURGERY COMPARED WITH  HIPPOCAMPAL-AVOIDANT WHOLE BRAIN RADIOTHERAPY (HA-WBRT)  PLUS MEMANTINE FOR 5-15 BRAIN METASTASES  04/05/2021    8:35AM  INITIAL CONSENT VISIT: Patient Heidi Mclaughlin was identified by Dr. Lisbeth Renshaw as a potential candidate for the above listed study. This Clinical Research Coordinator met with Heidi Mclaughlin, [id number], on 04/05/21 in a manner and location that ensures patient privacy to discuss participation in the above listed research study.  Patient is Accompanied by her husband.A copy of the informed consent document with embedded HIPAA language was provided to the patient.  Patient reads, speaks, and understands [place].   Patient was provided with the business card of this Coordinator and encouraged to contact the research team with any questions.  Approximately 30 minutes were spent with the patient reviewing the informed consent documents.  Patient was provided the option of taking informed consent documents home to review and was encouraged to review at their convenience with their support network, including other care providers. Patient took the consent documents home to review. Informed patient of importance of speaking with insurance company to let them know of her interest in study and verify they will pay for study related imaging and treatment. Patient and her husband expressed understanding and verified they would contact their insurance. Educated patient on the randomization aspect of the study and informed patient that it is not known if one type of treatment is superior to the other. Patient's husband, Heidi Mclaughlin, asked about the price of Memantine, but this coordinator was unsure of the price, so it was recommended he ask their insurance.It was a pleasure speaking with Heidi Mclaughlin and her husband, Heidi Mclaughlin. Patient was thanked for her time and support of the above mentioned study.   This Coordinator has reviewed this  patient's inclusion and exclusion criteria and confirmed Heidi Mclaughlin is eligible for study participation.  Patient will continue with enrollment.  Menopausal status (women only): Heidi Mclaughlin is post menopausal and has had a hysterectomy.  The plan is to speak with patient later this afternoon and create an appointment tomorrow to sign consent and complete all baseline questionnaires and assessments. Patient is in agreeance with plan.  Heidi Mclaughlin, RT(R)(T) Clinical Research Coordinator  04/05/21    3:13PM  PHONE CALL: Confirmed I was speaking with Heidi Mclaughlin. Informed patient we could sign consent and complete baseline assessments tomorrow morning. Patient was thanked for her time.   Heidi Mclaughlin, RT(R)(T) Clinical Research Coordinator

## 2021-04-06 ENCOUNTER — Other Ambulatory Visit: Payer: Self-pay | Admitting: Sports Medicine

## 2021-04-06 ENCOUNTER — Encounter: Payer: Self-pay | Admitting: *Deleted

## 2021-04-06 ENCOUNTER — Inpatient Hospital Stay: Payer: Medicare Other | Admitting: Radiology

## 2021-04-06 ENCOUNTER — Inpatient Hospital Stay: Payer: Medicare Other

## 2021-04-06 ENCOUNTER — Encounter: Payer: Self-pay | Admitting: Hematology & Oncology

## 2021-04-06 ENCOUNTER — Inpatient Hospital Stay (HOSPITAL_BASED_OUTPATIENT_CLINIC_OR_DEPARTMENT_OTHER): Payer: Medicare Other | Admitting: Hematology & Oncology

## 2021-04-06 ENCOUNTER — Other Ambulatory Visit: Payer: Self-pay | Admitting: Emergency Medicine

## 2021-04-06 ENCOUNTER — Other Ambulatory Visit: Payer: Self-pay | Admitting: Student

## 2021-04-06 VITALS — BP 124/65 | HR 69 | Temp 98.2°F | Resp 20 | Wt 149.0 lb

## 2021-04-06 DIAGNOSIS — Z79899 Other long term (current) drug therapy: Secondary | ICD-10-CM | POA: Diagnosis not present

## 2021-04-06 DIAGNOSIS — C3411 Malignant neoplasm of upper lobe, right bronchus or lung: Secondary | ICD-10-CM

## 2021-04-06 DIAGNOSIS — C3491 Malignant neoplasm of unspecified part of right bronchus or lung: Secondary | ICD-10-CM | POA: Diagnosis not present

## 2021-04-06 DIAGNOSIS — C7931 Secondary malignant neoplasm of brain: Secondary | ICD-10-CM

## 2021-04-06 DIAGNOSIS — R918 Other nonspecific abnormal finding of lung field: Secondary | ICD-10-CM

## 2021-04-06 DIAGNOSIS — Z7189 Other specified counseling: Secondary | ICD-10-CM

## 2021-04-06 DIAGNOSIS — Z7952 Long term (current) use of systemic steroids: Secondary | ICD-10-CM | POA: Diagnosis not present

## 2021-04-06 DIAGNOSIS — Z7982 Long term (current) use of aspirin: Secondary | ICD-10-CM | POA: Diagnosis not present

## 2021-04-06 HISTORY — DX: Other specified counseling: Z71.89

## 2021-04-06 LAB — CBC WITH DIFFERENTIAL (CANCER CENTER ONLY)
Abs Immature Granulocytes: 0.17 10*3/uL — ABNORMAL HIGH (ref 0.00–0.07)
Basophils Absolute: 0 10*3/uL (ref 0.0–0.1)
Basophils Relative: 0 %
Eosinophils Absolute: 0 10*3/uL (ref 0.0–0.5)
Eosinophils Relative: 0 %
HCT: 41.8 % (ref 36.0–46.0)
Hemoglobin: 14.7 g/dL (ref 12.0–15.0)
Immature Granulocytes: 2 %
Lymphocytes Relative: 10 %
Lymphs Abs: 1.1 10*3/uL (ref 0.7–4.0)
MCH: 29.8 pg (ref 26.0–34.0)
MCHC: 35.2 g/dL (ref 30.0–36.0)
MCV: 84.8 fL (ref 80.0–100.0)
Monocytes Absolute: 1 10*3/uL (ref 0.1–1.0)
Monocytes Relative: 9 %
Neutro Abs: 9.2 10*3/uL — ABNORMAL HIGH (ref 1.7–7.7)
Neutrophils Relative %: 79 %
Platelet Count: 345 10*3/uL (ref 150–400)
RBC: 4.93 MIL/uL (ref 3.87–5.11)
RDW: 12.5 % (ref 11.5–15.5)
WBC Count: 11.5 10*3/uL — ABNORMAL HIGH (ref 4.0–10.5)
nRBC: 0 % (ref 0.0–0.2)

## 2021-04-06 LAB — COMPREHENSIVE METABOLIC PANEL
ALT: 20 U/L (ref 0–44)
AST: 13 U/L — ABNORMAL LOW (ref 15–41)
Albumin: 4.1 g/dL (ref 3.5–5.0)
Alkaline Phosphatase: 42 U/L (ref 38–126)
Anion gap: 8 (ref 5–15)
BUN: 31 mg/dL — ABNORMAL HIGH (ref 8–23)
CO2: 24 mmol/L (ref 22–32)
Calcium: 9 mg/dL (ref 8.9–10.3)
Chloride: 100 mmol/L (ref 98–111)
Creatinine, Ser: 0.78 mg/dL (ref 0.44–1.00)
GFR, Estimated: >60 mL/min (ref >60)
Glucose, Bld: 112 mg/dL — ABNORMAL HIGH (ref 70–99)
Potassium: 3.9 mmol/L (ref 3.5–5.1)
Sodium: 132 mmol/L — ABNORMAL LOW (ref 135–145)
Total Bilirubin: 0.4 mg/dL (ref 0.3–1.2)
Total Protein: 6 g/dL — ABNORMAL LOW (ref 6.5–8.1)

## 2021-04-06 LAB — SAVE SMEAR(SSMR), FOR PROVIDER SLIDE REVIEW

## 2021-04-06 LAB — RESEARCH LABS

## 2021-04-06 MED ORDER — OSIMERTINIB MESYLATE 80 MG PO TABS
80.0000 mg | ORAL_TABLET | Freq: Every day | ORAL | 6 refills | Status: AC
  Filled 2021-04-06 – 2021-05-12 (×2): qty 30, 30d supply, fill #0

## 2021-04-06 NOTE — Research (Signed)
  This Nurse has reviewed this patient's inclusion and exclusion criteria as a second review and confirms Heidi Mclaughlin is eligible for study participation.  Patient may continue with enrollment. Brion Aliment RN, BSN, CCRP Clinical Research Nurse Lead 04/06/2021 5:23 PM

## 2021-04-06 NOTE — Progress Notes (Signed)
Patient oncotype MAP results back and reviewed with Dr Marin Olp.   Treatment will not include infusional chemo and Dr Marin Olp would like the port placement cancelled. Appointment cancelled.   Visited with patient prior to MD visit. She is glad to hear that the port appointment is cancelled. She asks multiple questions about timeline expectations and any restrictions she may have. Answered to her satisfaction.   Business cards for our Specialty Pharmacy team given to the patient.   Oncology Nurse Navigator Documentation  Oncology Nurse Navigator Flowsheets 04/06/2021  Abnormal Finding Date -  Confirmed Diagnosis Date -  Diagnosis Status -  Navigator Follow Up Date: 05/12/2021  Navigator Follow Up Reason: Follow-up Appointment  Navigator Location [facility]  Referral Date to RadOnc/MedOnc -  Navigator Encounter Type Follow-up Appt  Telephone -  Patient Visit Type MedOnc  Treatment Phase Pre-Tx/Tx Discussion  Barriers/Navigation Needs Coordination of Care;Education  Education Other;Preparing for Upcoming Surgery/ Treatment  Interventions Education;Psycho-Social Support  Acuity Level 2-Minimal Needs (1-2 Barriers Identified)  Coordination of Care -  Education Method Verbal  Support Groups/Services Friends and Family  Time Spent with Patient 61

## 2021-04-06 NOTE — Research (Signed)
CE.7: A PHASE III TRIAL OF STEREOTACTIC RADIOSURGERY COMPARED WITH HIPPOCAMPAL-AVOIDANT WHOLE BRAIN RADIOTHERAPY (HA-WBRT) PLUS MEMANTINE FOR 5-15 BRAIN METASTASES  04/06/21    9:30AM  ELIGIBLITY: This Coordinator has reviewed this patient's inclusion and exclusion criteria and confirmed Heidi Mclaughlin is eligible for study participation.  Patient will continue with enrollment.  Menopausal status (women only): Heidi Mclaughlin has had a hysterectomy.   CONSENT VISIT: This Clinical Research Coordinator met with Heidi Mclaughlin, Heidi Mclaughlin on 04/06/21 in a manner and location that ensures patient privacy to discuss participation in the above listed research study.  Patient is Accompanied by her husand, Heidi Mclaughlin.  Patient was previously provided with informed consent documents.  Patient confirmed they have read the informed consent documents.  As outlined in the informed consent form, this Coordinator and Heidi Mclaughlin discussed the purpose of the research study, the investigational nature of the study, study procedures and requirements for study participation, potential risks and benefits of study participation, as well as alternatives to participation.  This study is not blinded or double-blinded. The patient understands participation is voluntary and they may withdraw from study participation at any time.  Each study arm was reviewed, and randomization discussed.  Potential side effects were reviewed with patient as outlined in the consent form, and patient made aware there may be side effects not yet known. This study does not involve a placebo. Patient understands enrollment is pending full eligibility review.   Confidentiality and how the patient's information will be used as part of study participation were discussed.  Patient was informed there is not reimbursement provided for their time and effort spent on trial participation.  The patient is encouraged to discuss research study participation with  their insurance provider to determine what costs they may incur as part of study participation, including research related injury.    All questions were answered to patient's satisfaction.  The informed consent and separate HIPAA Authorization was reviewed page by page.  The patient's mental and emotional status is appropriate to provide informed consent, and the patient verbalizes an understanding of study participation.  Patient has agreed to participate in the above listed research study and has voluntarily signed the informed consent dated 08/28/2019 version and separate HIPAA Authorization, version not listed on 04/06/21 at 10:04 AM.  The patient was provided with a copy of the signed informed consent form and separate HIPAA Authorization for their reference.  No study specific procedures were obtained prior to the signing of the informed consent document.  Approximately 40 minutes were spent with the patient reviewing the informed consent documents.    Upon completion of signing of consent, all baseline assessments including labs were then completed.     PROs: Per study protocol, all PROs required for this visit were completed prior to other study activities and completeness has been verified.      MEDICATION REVIEW: Medications reviewed with patient. Aspirin: patient taking as recorded. Patient began taking 20+ yrs ago as recommended by provider for general health. Zyrtec: patient taking as listed for medical history of allergies. Folic Acid: patient taking as listed per patient preference for general maintenance. Cinnamon: patient taking as listed for medical history of high blood pressure. Patient preference; not prescribed by provider. Dexamethasone: currently taking as prescribed. Flonase + Flovent HFA: patient states no longer taking. Hydrochlorothiazide: patient taking as prescribed for history of high blood pressure. Zaditor; Systane: patient using as prescribed. Systane patient reports  using daily. Singulair: patient reports taking as needed for allergy induced  asthma. Multivitamins w/ minerals; multivitmains; fish oils: patient reports taking for general maintenance. Requip: patient reports taking as prescribed for documented restless leg syndrome. Zocor: patient reports taking as prescribed for documented hyperlipidemia. Lysine; Valtrex: patient reports taking Lysine BID for prevention of cold sores and Valtrex if needed for active cold sores. Patient does not report an active cold sore.   MD/PROVIDER VISIT: Patient saw the provider on 03/29/2021 and does not need an updated visit for baseline assessments.   ADVERSE EVENTS: Patient Heidi Mclaughlin reports .  ADVERSE EVENT LOGDeshanti Heidi Mclaughlin  04/06/2021  Adverse Event Log  Study/Protocol: CE.7 Cycle: Baseline  Event Grade Onset Date Resolved Date Attribution Comments  Immune system disorder-other- allergy Grade 2 Medical history ongoing N/A medical history Zyrtec and two eye drop prescriptions    Respiratory, thoracic and  mediastinal disorders - Other-allergy induced asthma Grade 1 Medical history ongoing N/A medical history Has Singulair as needed for grade 2 events  Hypertension Grade 2 Medical history ongoing N/A medical history Takes prescription medication daily for treatment;takes supplement per patient preference  Cough Grade 1 2020 ongoing N/A medical history Led to cancer diagnosis.  Nervous system disorders - Other-Restless Leg Syndrome Grade 2 Medical history (2014) ongoing N/A medical history Taking Requip for restless leg syndrome.  Herpes simplex reactivation Grade 1 Medical history ongoing N/Amedical history Has Valtrex as needed for grade 2 events.  Hyperlipidemia Grade 2 Medical history ongoing NA medical history Taking Zocor for hyperlipidemia.    NEUROCOGNITIVE ASSESSMENT: Neurocognitive assessment is completed by this clinical research coordinator as described in  protocol.   DISPOSITION: Upon completion off all study requirements, patient was escorted to the Mclaughlin waiting area for lab collection.   LABS: Mandatory and optional labs including urine are collected per consent and study protocol: Patient Heidi Mclaughlin tolerated well without complaint.  The patient was thanked for their time and continued voluntary participation in this study. Patient Heidi Mclaughlin has been provided direct contact information and is encouraged to contact this Coordinator for any needs or questions.  All those involved in this patient's care were informed of consent and completion of baseline assessments.  FINAL REVIEW OF ELIGIBILITY: A final review of eligibility was confirmed and signed by Heidi Mclaughlin.    CALL WITH DR. Isidore Mclaughlin AND DR Heidi Mclaughlin: Prior to randomization, there was a question concerning a physician credentialed for WB with Hippocampal sparing. This clinical Research officer, political party assisted by clinical research nurses, Heidi Johns, RN and Heidi Martin, RN had a conference call with Dr. Isidore Mclaughlin and Dr. Vallarie Mclaughlin, study chair to assist with question. Dr. Vallarie Mclaughlin confirmed it to be acceptable for Dr. Lisbeth Mclaughlin to complete contours for the treatment, but Dr. Isidore Mclaughlin, as a credentialed physician for [facility] hippocampal sparing, to sign off/review and document verification of plan prior to patient starting treatment.Upon finalizing this question, patient was randomized.   RANDOMIZATION: Patient Heidi Mclaughlin is randomized to whole brain with hippocampal sparing.  Stratification criteria were confirmed and verified by Heidi Mclaughlin. Stratification factors from protocol were signed by Dr. Lisbeth Mclaughlin. As part of the assigned treatment arm, patient Carolynne Schuchard will receive whole brain with hippocampal treatment. MD was notified of patient assignment; treatment start date is pending based on [facility] acceptance of plan.  Per study protocol, patient Linnet Bottari will be notified of their treatment  assignment. Patient Morgen Linebaugh is successfully enrolled in the above study.  PHONE CALL: Called patient to inform her of randomization arm to whole brain with  hippocampal sparing. Patient did not have any further questions at this time. Patient expressed understanding and was thanked for her time.      Carol Ada, RT(R)(T) Clinical Research Coordinator

## 2021-04-06 NOTE — Progress Notes (Signed)
The patient has undergone additional work-up including a PET scan.  The findings are consistent with metastatic disease.  There is a finding with some hypermetabolic activity within the thyroid which raises the possibility of a primary lesion at this location.  This will undergo further work-up.  This will not impact the current plans to proceed with treatment for intracranial metastasis with radiation treatment.  ------------------------------------------------  Jodelle Gross, MD, PhD

## 2021-04-06 NOTE — Research (Signed)
DCP-001: Use of a Clinical Trial Screening Tool to Address Cancer Health Disparities in the [place] [facility] ([facility])  04/06/21   10:00AM  Upon completion of consent for CE.7, the above mentioned study was reviewed. Informed patient of the reason for study, voluntary nature, and one time data collection method. Patient was interested in participating. The patient understands participation is voluntary and they may withdraw from study participation at any time.  Confidentiality and how the patient's information will be used as part of study participation were discussed.   All questions were answered to patient's satisfaction.  The informed consent and separate HIPAA Authorization was reviewed page by page.  The patient's mental and emotional status is appropriate to provide informed consent, and the patient verbalizes an understanding of study participation.  Patient has agreed to participate in the above listed research study and has voluntarily signed the informed consent dated 05/06/2019  and separate HIPAA Authorization, version 5 revised 10/29/2019 on 04/06/21 at 10:09 AM.  The patient was provided with a copy of the signed informed consent form and separate HIPAA Authorization for their reference.  No study specific procedures were obtained prior to the signing of the informed consent document.  Approximately 5 minutes were spent with the patient reviewing the informed consent documents.   The DCP-001 Worksheet was then completed by the patient and was checked for completeness. Patient was thanked for her time and continued support. Patient was registered into OPEN and given PID: [id number]  [name], RT(R)(T) Clinical Research Coordinator

## 2021-04-06 NOTE — Progress Notes (Signed)
Hematology and Oncology Follow Up Visit  Heidi Mclaughlin [id number] 1956/02/26 65 y.o. 04/06/2021   Principle Diagnosis:   Metastatic adenocarcinoma of the lung-EGFR mutated (T790M/L858R)  Current Therapy:    Tagrisso 80 mg p.o. daily --start on 04/13/2021  Radiation therapy for CNS metastasis     Interim History:  Heidi Mclaughlin is back for follow-up.  This is her second office visit.  Not surprisingly, we found that she truly does have a adenocarcinoma.  She did have a bronchoscopy.  This was done on 03/22/2021.  The pathology report ([id number]) shows malignant cells consistent with adenocarcinoma.  We did do the molecular analysis.  To no surprise, she had a tumor that was EGFR mutated.  Surprisingly, she had 2 mutations.  She had the T790M and L858R mutations.  We did do restaging studies.  We did do an MRI of the brain.  She was asymptomatic but surprisingly, she had multiple brain metastasis.  She had numerous brain lesions.  She had at least 14.  They are all quite small.  She is going to start her radiation therapy next week.  She is on steroids right now.  We did do a PET scan on her.  I will little bit surprised that the PET scan only showed that she had extensive thoracic disease.  I am sure the tumor started in the right hilum.  This was quite active.  She had multiple pulmonary nodules.  She had lymphadenopathy in the hilum and mediastinum.  She had no disease below the diaphragm.  She had no bony disease.  She looks great.  She feels great.  I am sure the steroids are helping.  Because of the T790M mutation, Tagrisso is the recommended therapy.  I talked to Heidi Mclaughlin and her husband about this.  I went over the side effects.  We will have to watch out for high blood pressure.  She could certainly have some diarrhea.  We will have to watch her electrolytes.  There is a risk of hypothyroidism.  Fatigue also could certainly be an issue.  She understands all of these  potential side effects.  I do think that Tagrisso should have a very good chance of causing tumor regression.  Heidi Mclaughlin agrees to start the [place].  Currently, her performance status is ECOG 0.  Medications:  Current Outpatient Medications:  .  aspirin EC 81 MG tablet, Take 81 mg by mouth daily. Swallow whole., Disp: , Rfl:  .  cetirizine (ZYRTEC) 10 MG tablet, Take 10 mg by mouth daily., Disp: , Rfl:  .  CINNAMON PO, Take 1,000 mg by mouth daily., Disp: , Rfl:  .  dexamethasone (DECADRON) 4 MG tablet, Take 1 tablet (4 mg total) by mouth 2 (two) times daily., Disp: 30 tablet, Rfl: 1 .  folic acid (FOLVITE) 1 MG tablet, TAKE 1 TABLET BY MOUTH EVERY DAY (Patient taking differently: Take 1 mg by mouth daily.), Disp: 90 tablet, Rfl: 2 .  hydrochlorothiazide (HYDRODIURIL) 25 MG tablet, TAKE 1 TABLET BY MOUTH EVERY DAY (Patient taking differently: Take 25 mg by mouth daily.), Disp: 90 tablet, Rfl: 3 .  Lysine 1000 MG TABS, Take 1,000 mg by mouth daily., Disp: , Rfl:  .  montelukast (SINGULAIR) 10 MG tablet, TAKE 1 TABLET BY MOUTH EVERYDAY AT BEDTIME (Patient taking differently: Take 10 mg by mouth at bedtime.), Disp: 90 tablet, Rfl: 1 .  Multiple Minerals-Vitamins (CAL-MAG-ZINC-D) TABS, Take 1 tablet by mouth daily., Disp: , Rfl:  .  Omega-3  Fatty Acids (FISH OIL ULTRA) 1400 MG CAPS, Take 1,400 mg by mouth daily., Disp: , Rfl:  .  Polyethyl Glycol-Propyl Glycol (SYSTANE) 0.4-0.3 % SOLN, Place 1 drop into both eyes daily as needed (allergies)., Disp: , Rfl:  .  rOPINIRole (REQUIP) 1 MG tablet, TAKE 1 TABLET (1 MG TOTAL) BY MOUTH AT BEDTIME., Disp: 90 tablet, Rfl: 1 .  simvastatin (ZOCOR) 40 MG tablet, TAKE 1 TABLET BY MOUTH EVERY DAY (Patient taking differently: Take 40 mg by mouth daily.), Disp: 90 tablet, Rfl: 3 .  vitamin C (ASCORBIC ACID) 500 MG tablet, Take 500 mg by mouth 2 (two) times daily., Disp: , Rfl:  .  ketotifen (ZADITOR) 0.025 % ophthalmic solution, Place 1 drop into both eyes  2 (two) times daily as needed (allergies). (Patient not taking: Reported on 04/06/2021), Disp: , Rfl:  .  valACYclovir (VALTREX) 500 MG tablet, TAKE 1 TABLET (500 MG TOTAL) BY MOUTH AS NEEDED. (Patient not taking: Reported on 04/06/2021), Disp: 90 tablet, Rfl: 0  Allergies:  Allergies  Allergen Reactions  . Amoxicillin Anaphylaxis  . Penicillins Anaphylaxis  . Pentazocine-Naloxone Hcl Anxiety and Other (See Comments)    insomnia    . Poison Ivy Extract [Poison Ivy Extract] Rash    Past Medical History, Surgical history, Social history, and Family History were reviewed and updated.  Review of Systems: Review of Systems  Constitutional: Negative.   HENT:  Negative.   Eyes: Negative.   Respiratory: Negative.   Cardiovascular: Negative.   Gastrointestinal: Negative.   Endocrine: Negative.   Genitourinary: Negative.    Musculoskeletal: Negative.   Skin: Negative.   Neurological: Negative.   Hematological: Negative.   Psychiatric/Behavioral: Negative.     Physical Exam:  weight is 149 lb (67.6 kg). Her oral temperature is 98.2 F (36.8 C). Her blood pressure is 124/65 and her pulse is 69. Her respiration is 20 and oxygen saturation is 98%.   Wt Readings from Last 3 Encounters:  04/06/21 149 lb (67.6 kg)  04/05/21 147 lb 12.8 oz (67 kg)  03/29/21 151 lb 3.2 oz (68.6 kg)    Physical Exam Vitals reviewed.  HENT:     Head: Normocephalic and atraumatic.  Eyes:     Pupils: Pupils are equal, round, and reactive to light.  Cardiovascular:     Rate and Rhythm: Normal rate and regular rhythm.     Heart sounds: Normal heart sounds.  Pulmonary:     Effort: Pulmonary effort is normal.     Breath sounds: Normal breath sounds.  Abdominal:     General: Bowel sounds are normal.     Palpations: Abdomen is soft.  Musculoskeletal:        General: No tenderness or deformity. Normal range of motion.     Cervical back: Normal range of motion.  Lymphadenopathy:     Cervical: No  cervical adenopathy.  Skin:    General: Skin is warm and dry.     Findings: No erythema or rash.  Neurological:     Mental Status: She is alert and oriented to person, place, and time.  Psychiatric:        Behavior: Behavior normal.        Thought Content: Thought content normal.        Judgment: Judgment normal.    Lab Results  Component Value Date   WBC 11.5 (H) 04/06/2021   HGB 14.7 04/06/2021   HCT 41.8 04/06/2021   MCV 84.8 04/06/2021   PLT 345 04/06/2021  Chemistry      Component Value Date/Time   NA 132 (L) 04/06/2021 1517   K 3.9 04/06/2021 1517   CL 100 04/06/2021 1517   CO2 24 04/06/2021 1517   BUN 31 (H) 04/06/2021 1517   CREATININE 0.78 04/06/2021 1517   CREATININE 0.89 03/18/2021 1104   CREATININE 0.92 10/20/2020 0822      Component Value Date/Time   CALCIUM 9.0 04/06/2021 1517   ALKPHOS 42 04/06/2021 1517   AST 13 (L) 04/06/2021 1517   AST 14 (L) 03/18/2021 1104   ALT 20 04/06/2021 1517   ALT 17 03/18/2021 1104   BILITOT 0.4 04/06/2021 1517   BILITOT 0.7 03/18/2021 1104      Impression and Plan: Heidi Mclaughlin is a very charming 66 year old white female.  She certainly looks much younger.  She is in tremendous shape.  She is a non-smoker.  She has metastatic adenocarcinoma of the right lung.  The tumor has a EGFR mutation.  She is going start radiation therapy.  We will see about getting the [place] for her.  Hopefully, she will be able to start the [place] in a week or so.  I would not think that this would affect the radiation.  I know that Tagrisso can penetrate the blood-brain barrier and get into the brain.  I really believe that she should do quite well.  I suspect that she should have a good response of over 75%.  I would not do another scan on her probably for about 3 months.  I would like to see how everything goes with the Tagrisso and brain radiation.  I will see her back myself in about a month or so.  By then, she should be done  with radiation and should be on Tagrisso.   Volanda Napoleon, MD 5/25/20224:57 PM

## 2021-04-07 ENCOUNTER — Ambulatory Visit (HOSPITAL_COMMUNITY): Payer: Medicare Other

## 2021-04-07 ENCOUNTER — Ambulatory Visit: Payer: Medicare Other

## 2021-04-07 ENCOUNTER — Other Ambulatory Visit (HOSPITAL_COMMUNITY): Payer: Self-pay

## 2021-04-07 ENCOUNTER — Encounter: Payer: Self-pay | Admitting: Radiology

## 2021-04-07 ENCOUNTER — Encounter (HOSPITAL_COMMUNITY): Payer: Self-pay

## 2021-04-07 ENCOUNTER — Other Ambulatory Visit (HOSPITAL_COMMUNITY): Payer: Medicare Other

## 2021-04-07 ENCOUNTER — Other Ambulatory Visit: Payer: Self-pay | Admitting: Radiation Oncology

## 2021-04-07 ENCOUNTER — Ambulatory Visit
Admission: RE | Admit: 2021-04-07 | Discharge: 2021-04-07 | Disposition: A | Discharge status: Home / Self Care | Payer: Medicare Other | Source: Ambulatory Visit | Attending: Radiation Oncology | Admitting: Radiation Oncology

## 2021-04-07 ENCOUNTER — Telehealth: Payer: Self-pay | Admitting: Pharmacist

## 2021-04-07 ENCOUNTER — Encounter: Payer: Self-pay | Admitting: *Deleted

## 2021-04-07 ENCOUNTER — Other Ambulatory Visit: Payer: Self-pay

## 2021-04-07 ENCOUNTER — Telehealth: Payer: Self-pay

## 2021-04-07 ENCOUNTER — Telehealth: Payer: Self-pay | Admitting: Pharmacy Technician

## 2021-04-07 DIAGNOSIS — C3411 Malignant neoplasm of upper lobe, right bronchus or lung: Secondary | ICD-10-CM | POA: Diagnosis not present

## 2021-04-07 DIAGNOSIS — C7931 Secondary malignant neoplasm of brain: Secondary | ICD-10-CM | POA: Diagnosis not present

## 2021-04-07 DIAGNOSIS — Z510 Encounter for antineoplastic radiation therapy: Secondary | ICD-10-CM | POA: Diagnosis not present

## 2021-04-07 DIAGNOSIS — Z87891 Personal history of nicotine dependence: Secondary | ICD-10-CM | POA: Diagnosis not present

## 2021-04-07 NOTE — Telephone Encounter (Signed)
Oral Oncology Pharmacy Student Encounter  Received new prescription for Tagrisso (osimertinib) for the treatment of metastatic adenocarcinoma of the lung-EGFR mutated (T790M/L858R), planned duration until disease progresses or unacceptable toxicity.  MRI of brain from 04/01/2021 assessed, finding numerous brain leisions. CT scan of chest from 03/14/2021 assessed, finding extensive thoracic disease with tumor starting in right hilum with multiple pulmonary nodules. Enlarged right hilar, subcarinal, and pretracheal lymph nodes found.  Prescription dose and frequency assessed.   Current medication list in Epic reviewed, one DDIs with osimertinib identified:  osimertinib may decrease the serum concentration of Simvastatin. No baseline dose adjustment needed.  Evaluated chart and no patient barriers to medication adherence identified.   Prescription has been e-scribed to the [facility] for benefits analysis and approval.  Oral Oncology Clinic will continue to follow for insurance authorization, copayment issues, initial counseling and start date.  Venora Maples, PharmD Candidate [facility] [phone]  04/07/2021 11:42 AM

## 2021-04-07 NOTE — Telephone Encounter (Signed)
S/w pt and she is aware of her appts per 5.26.22 los   Heidi Mclaughlin

## 2021-04-07 NOTE — Telephone Encounter (Signed)
Oral Oncology Patient Advocate Encounter  Prior Authorization for Heidi Mclaughlin has been approved.    PA# [id number] Effective dates: 11/13/20 through 04/07/22  Patients co-pay is $2788.01  Oral Oncology Clinic will continue to follow.   [place] Patient [place] Phone [phone] Fax [phone] 04/07/2021 11:49 AM

## 2021-04-07 NOTE — Progress Notes (Signed)
Letter provided for jury duty xcuse  Oncology Nurse Navigator Documentation  Oncology Nurse Navigator Flowsheets 04/07/2021  Abnormal Finding Date -  Confirmed Diagnosis Date -  Diagnosis Status -  Navigator Follow Up Date: 05/12/2021  Navigator Follow Up Reason: Follow-up Appointment  Navigator Location [facility]  Referral Date to RadOnc/MedOnc -  Navigator Encounter Type Letter/Fax/Email  Telephone -  Patient Visit Type MedOnc  Treatment Phase Pre-Tx/Tx Discussion  Barriers/Navigation Needs Coordination of Care;Education  Education -  Interventions Other  Acuity Level 2-Minimal Needs (1-2 Barriers Identified)  Coordination of Care -  Education Method -  Support Groups/Services Friends and Family  Time Spent with Patient 30

## 2021-04-07 NOTE — Telephone Encounter (Signed)
Oral Oncology Patient Advocate Encounter   Received notification from [facility] that prior authorization for Tagrisso is required.   PA submitted on CoverMyMeds Key [id number] Status is pending   Oral Oncology Clinic will continue to follow.  [place] Patient [place] Phone [phone] Fax [phone] 04/07/2021 11:27 AM

## 2021-04-07 NOTE — Progress Notes (Signed)
A PHASE III TRIAL OF STEREOTACTIC RADIOSURGERY COMPARED WITH HIPPOCAMPAL-AVOIDANT WHOLE BRAIN RADIOTHERAPY (HA-WBRT) PLUS MEMANTINE FOR 5-15 BRAIN METASTASES  04/07/2021     10:15AM  VISIT: Met with Heidi Mclaughlin and her husband, [name] in the [place] room. Informed patient reason for visit was to obtain a signature for an ROI. Patient signed ROI and was thanked for her time. There were no current questions, but they were encouraged to call this coordinator with any questions that may arise.    Carol Ada, RT(R)(T) Clinical Research Coordinator

## 2021-04-08 ENCOUNTER — Encounter (HOSPITAL_COMMUNITY): Payer: Self-pay

## 2021-04-08 DIAGNOSIS — C7931 Secondary malignant neoplasm of brain: Secondary | ICD-10-CM | POA: Diagnosis not present

## 2021-04-08 DIAGNOSIS — Z510 Encounter for antineoplastic radiation therapy: Secondary | ICD-10-CM | POA: Diagnosis not present

## 2021-04-08 DIAGNOSIS — C3411 Malignant neoplasm of upper lobe, right bronchus or lung: Secondary | ICD-10-CM | POA: Diagnosis not present

## 2021-04-08 DIAGNOSIS — Z87891 Personal history of nicotine dependence: Secondary | ICD-10-CM | POA: Diagnosis not present

## 2021-04-12 ENCOUNTER — Encounter: Payer: Self-pay | Admitting: Radiation Oncology

## 2021-04-12 ENCOUNTER — Other Ambulatory Visit: Payer: Self-pay

## 2021-04-12 ENCOUNTER — Ambulatory Visit
Admission: RE | Admit: 2021-04-12 | Discharge: 2021-04-12 | Disposition: A | Discharge status: Home / Self Care | Payer: Medicare Other | Source: Ambulatory Visit | Attending: Radiation Oncology | Admitting: Radiation Oncology

## 2021-04-12 DIAGNOSIS — C7931 Secondary malignant neoplasm of brain: Secondary | ICD-10-CM | POA: Diagnosis not present

## 2021-04-12 DIAGNOSIS — C3411 Malignant neoplasm of upper lobe, right bronchus or lung: Secondary | ICD-10-CM | POA: Diagnosis not present

## 2021-04-12 DIAGNOSIS — Z87891 Personal history of nicotine dependence: Secondary | ICD-10-CM | POA: Diagnosis not present

## 2021-04-12 DIAGNOSIS — Z510 Encounter for antineoplastic radiation therapy: Secondary | ICD-10-CM | POA: Diagnosis not present

## 2021-04-12 NOTE — Progress Notes (Signed)
I reviewed the patient's hippocampal sparing whole brain radiation plan with physics and Dr. Lisbeth Renshaw in conjunction with our review of the CE.7 protocol.  I concur with that contours and the plan that Dr. Lisbeth Renshaw has approved.  -----------------------------------  Eppie Gibson, MD

## 2021-04-13 ENCOUNTER — Other Ambulatory Visit: Payer: Self-pay | Admitting: Radiation Oncology

## 2021-04-13 ENCOUNTER — Encounter: Payer: Self-pay | Admitting: Radiology

## 2021-04-13 ENCOUNTER — Ambulatory Visit
Admission: RE | Admit: 2021-04-13 | Discharge: 2021-04-13 | Disposition: A | Discharge status: Home / Self Care | Payer: Medicare Other | Source: Ambulatory Visit | Attending: Radiation Oncology | Admitting: Radiation Oncology

## 2021-04-13 DIAGNOSIS — C3411 Malignant neoplasm of upper lobe, right bronchus or lung: Secondary | ICD-10-CM | POA: Insufficient documentation

## 2021-04-13 DIAGNOSIS — Z87891 Personal history of nicotine dependence: Secondary | ICD-10-CM | POA: Diagnosis not present

## 2021-04-13 DIAGNOSIS — Z006 Encounter for examination for normal comparison and control in clinical research program: Secondary | ICD-10-CM | POA: Diagnosis present

## 2021-04-13 DIAGNOSIS — C7931 Secondary malignant neoplasm of brain: Secondary | ICD-10-CM

## 2021-04-13 MED ORDER — MEMANTINE HCL 5 MG PO TABS: 5.0000 mg | ORAL_TABLET | Freq: Every day | ORAL | 0 refills | Status: AC

## 2021-04-13 MED ORDER — MEMANTINE HCL 10 MG PO TABS: 10.0000 mg | ORAL_TABLET | Freq: Two times a day (BID) | ORAL | 4 refills | Status: AC

## 2021-04-13 NOTE — Telephone Encounter (Signed)
Oral Chemotherapy Pharmacist Encounter  Patient Education I spoke with patient and her sister  for overview of new oral chemotherapy medication: Tagrisso (osimertinib) for the treatment of metastatic adenocarcinoma of the lung-EGFR mutated (T790M/L858R), planned duration until disease progresses or unacceptable toxicity.  Pt is doing well. Counseled patient on administration, dosing, side effects, monitoring, drug-food interactions, safe handling, storage, and disposal. Patient will take 1 tablet (80 mg total) by mouth daily.  Side effects include but not limited to: rash/itchy skin, diarrhea, decreased wbc/hgb/plt, nail changes.    Diarrhea: Heidi Mclaughlin will pick up some loperamide to have on hand Rash: Heidi Mclaughlin mentioned that she recently ordered some sun protecting clothing and hats, she also already uses sunscreen and facial sunscreen. She knows to report any rash development. She gets facials about every 5-6 weeks, but she will hold off on this for her first few months of Tagrisso to avoid any confusion about the cause of a rash development of a rash.   Reviewed with patient importance of keeping a medication schedule and plan for any missed doses.  After discussion with patient no patient barriers to medication adherence identified.   Heidi Mclaughlin voiced understanding and appreciation. All questions answered. Medication handout provided.  Provided patient with [facility] [place] [facility] phone number. Patient knows to call the office with questions or concerns. Oral Chemotherapy Navigation Clinic will continue to follow.  Darl Pikes, PharmD, BCPS, BCOP, CPP Hematology/Oncology Clinical Pharmacist Practitioner [facility] [phone]  04/13/2021 3:36 PM

## 2021-04-13 NOTE — Research (Signed)
A PHASE III TRIAL OF STEREOTACTIC RADIOSURGERY COMPARED WITH HIPPOCAMPAL-AVOIDANT WHOLE BRAIN RADIOTHERAPY (HA-WBRT) PLUS MEMANTINE FOR 5-15 BRAIN METASTASES  04/13/2021     3:15PM  MEDICATION DIARY VISIT: Met with Jolyne Laye who was accompanied by her sister in radiation oncology. Gave patient medication diaries for Memantine and informed patient how to properly fill out diary. Patient expressed understanding and re-taught back how to fill diaries out. Confirmed with Dr. Lisbeth Renshaw that Memantine prescription was sent to pharmacy for pick-up and for patient to start tomorrow as long WBRT begins as well. Patient was thanked for her time and encouraged to call with any questions or concerns. Informed patient if there is any change to her radiation start date, that this coordinator would call patient to update.   Carol Ada, RT(R)(T) Clinical Research Coordinator

## 2021-04-13 NOTE — Progress Notes (Signed)
The patient has seen medical oncology and the plan is to begin Tagrisso chemotherapy.  This is reasonable for the patient's care given the clinical context and this will be fine to give concurrently with the patient's course of whole brain radiation treatment.  Additional information includes the fact that the patient is right-handed.

## 2021-04-13 NOTE — Addendum Note (Signed)
Addended by: Carola Rhine on: 04/13/2021 12:04 PM   Modules accepted: Orders

## 2021-04-14 ENCOUNTER — Telehealth: Payer: Self-pay | Admitting: Radiation Oncology

## 2021-04-14 ENCOUNTER — Other Ambulatory Visit: Payer: Self-pay | Admitting: Radiation Oncology

## 2021-04-14 ENCOUNTER — Ambulatory Visit
Admission: RE | Admit: 2021-04-14 | Discharge: 2021-04-14 | Disposition: A | Discharge status: Home / Self Care | Payer: Medicare Other | Source: Ambulatory Visit | Attending: Radiation Oncology | Admitting: Radiation Oncology

## 2021-04-14 ENCOUNTER — Other Ambulatory Visit: Payer: Self-pay

## 2021-04-14 DIAGNOSIS — C7931 Secondary malignant neoplasm of brain: Secondary | ICD-10-CM | POA: Diagnosis not present

## 2021-04-14 DIAGNOSIS — Z006 Encounter for examination for normal comparison and control in clinical research program: Secondary | ICD-10-CM | POA: Diagnosis not present

## 2021-04-14 DIAGNOSIS — C3411 Malignant neoplasm of upper lobe, right bronchus or lung: Secondary | ICD-10-CM | POA: Diagnosis not present

## 2021-04-14 DIAGNOSIS — Z87891 Personal history of nicotine dependence: Secondary | ICD-10-CM | POA: Diagnosis not present

## 2021-04-14 NOTE — Telephone Encounter (Signed)
lvm for patient to sch their f/u with Dr. Lisbeth Renshaw after finishing tx.

## 2021-04-14 NOTE — Progress Notes (Signed)
Pt here for patient teaching.  Pt given Radiation and You booklet, skin care instructions and Sonafine.  Reviewed areas of pertinence such as fatigue, hair loss, nausea and vomiting, skin changes, throat changes, headache, blurry vision, cough and shortness of breath . Pt able to give teach back of to pat skin and use unscented/gentle soap,apply Sonafine bid and avoid applying anything to skin within 4 hours of treatment. Pt verbalizes understanding of information given and will contact nursing with any questions or concerns.     [email or web address]

## 2021-04-15 ENCOUNTER — Ambulatory Visit
Admission: RE | Admit: 2021-04-15 | Discharge: 2021-04-15 | Disposition: A | Discharge status: Home / Self Care | Payer: Medicare Other | Source: Ambulatory Visit | Attending: Radiation Oncology | Admitting: Radiation Oncology

## 2021-04-15 ENCOUNTER — Encounter: Payer: Self-pay | Admitting: *Deleted

## 2021-04-15 ENCOUNTER — Telehealth: Payer: Self-pay | Admitting: Radiology

## 2021-04-15 DIAGNOSIS — C3411 Malignant neoplasm of upper lobe, right bronchus or lung: Secondary | ICD-10-CM

## 2021-04-15 DIAGNOSIS — C7931 Secondary malignant neoplasm of brain: Secondary | ICD-10-CM | POA: Diagnosis not present

## 2021-04-15 DIAGNOSIS — Z006 Encounter for examination for normal comparison and control in clinical research program: Secondary | ICD-10-CM | POA: Diagnosis not present

## 2021-04-15 DIAGNOSIS — Z87891 Personal history of nicotine dependence: Secondary | ICD-10-CM | POA: Diagnosis not present

## 2021-04-15 MED ORDER — SONAFINE EX EMUL
1.0000 "application " | Freq: Once | CUTANEOUS | Status: AC
  Administered 2021-04-15: 1 via TOPICAL

## 2021-04-15 NOTE — Progress Notes (Signed)
Patient received her Tagrisso yesterday and will start today. She was able to get a one month supply free to determine tolerability as her copay is $2500 a month. She states they can afford this if necessary. Asked to inform us if this became a burden so we could see if there was anything available to help.   Patient started radiation this week. She is doing well and speaks highly of the Radiation Oncology team saying everyone is so wonderful and she feels like she's at a resort. I informed her to call our office with any problems with her new start, but that I would also reach out to her next week to check in.   Oncology Nurse Navigator Documentation  Oncology Nurse Navigator Flowsheets 04/15/2021  Abnormal Finding Date -  Confirmed Diagnosis Date -  Diagnosis Status -  Phase of Treatment Radiation  Radiation Actual Start Date: 04/12/2021  Navigator Follow Up Date: 04/20/2021  Navigator Follow Up Reason: Other:  Navigator Location [facility]  Referral Date to RadOnc/MedOnc -  Navigator Encounter Type Telephone  Telephone Patient Update;Outgoing Call  Treatment Initiated Date 04/12/2021  Patient Visit Type MedOnc  Treatment Phase Active Tx  Barriers/Navigation Needs Coordination of Care;Education  Education Other  Interventions Education;Psycho-Social Support  Acuity Level 2-Minimal Needs (1-2 Barriers Identified)  Coordination of Care -  Education Method Verbal  Support Groups/Services Friends and Family  Time Spent with Patient 30

## 2021-04-15 NOTE — Telephone Encounter (Signed)
A PHASE III TRIAL OF STEREOTACTIC RADIOSURGERY COMPARED WITH HIPPOCAMPAL-AVOIDANT WHOLE BRAIN RADIOTHERAPY (HA-WBRT) PLUS MEMANTINE FOR 5-15 BRAIN METASTASES  04/15/2021    10:25AM  PHONE CALL: Confirmed I was speaking with Heidi Mclaughlin. Informed patient reason for call was to ensure she had everything she needed and has started taking Memantine. Patient stated she does have the Memantine, but has not taken it yet. Encouraged patient to take this AM with a glass of water and to document the time and date on the medication diary. Patient expressed understanding and stated she would take the first dose this morning. Plan to meet with patient on Monday to assist with any questions and ensure treatment is going well for patient. Patient was thanked for her time and continued participation in the above mentioned study.  Carol Ada, RT(R)(T) Clinical Research Coordinator

## 2021-04-18 ENCOUNTER — Ambulatory Visit
Admission: RE | Admit: 2021-04-18 | Discharge: 2021-04-18 | Disposition: A | Discharge status: Home / Self Care | Payer: Medicare Other | Source: Ambulatory Visit | Attending: Radiation Oncology | Admitting: Radiation Oncology

## 2021-04-18 ENCOUNTER — Encounter: Payer: Self-pay | Admitting: Radiology

## 2021-04-18 ENCOUNTER — Other Ambulatory Visit: Payer: Self-pay

## 2021-04-18 ENCOUNTER — Ambulatory Visit: Payer: Medicare Other | Admitting: Sports Medicine

## 2021-04-18 DIAGNOSIS — C3411 Malignant neoplasm of upper lobe, right bronchus or lung: Secondary | ICD-10-CM | POA: Diagnosis not present

## 2021-04-18 DIAGNOSIS — Z87891 Personal history of nicotine dependence: Secondary | ICD-10-CM | POA: Diagnosis not present

## 2021-04-18 DIAGNOSIS — C7931 Secondary malignant neoplasm of brain: Secondary | ICD-10-CM | POA: Diagnosis not present

## 2021-04-18 DIAGNOSIS — Z006 Encounter for examination for normal comparison and control in clinical research program: Secondary | ICD-10-CM | POA: Diagnosis not present

## 2021-04-18 NOTE — Progress Notes (Signed)
A PHASE III TRIAL OF STEREOTACTIC RADIOSURGERY COMPARED WITH HIPPOCAMPAL-AVOIDANT WHOLE BRAIN RADIOTHERAPY (HA-WBRT) PLUS MEMANTINE FOR 5-15 BRAIN METASTASES  04/18/2021    11:35AM  VISIT: Met with [name] and her husband, [name] in the rad onc waiting room. Informed patient reason for visit was to check-in to answer any questions and ensure that everything was going well. Patient stated she is doing well and doing well with her medications. She stated she is documenting Memantine intake. [place] asked if she could do clear nail polish, and this coordinator informed her that would be appropriate. She also wanted to verify about coloring her hair---this coordinator advised her to not color hair at this time and patient expressed understanding. [name] asked when scans may potentially be done; this coordinator informed them that scans are generally acquired approx 3 months post RT, but she would have a brain MRI 8 weeks following treatment (August 2022). Patient verified if she was using her creams from radiation appropriately, and this coordinator confirmed she was. Added that patient should attempt to put cream on scalp as well. Informed patient her last radiation treatment for her WB was rescheduled to the morning of 6/15, so she could complete treatment prior to her visit with Dr. Lisbeth Renshaw. She expressed understanding and this coordinator printed out a new schedule and gave a copy to [place]. Also informed patient, this coordinator would have a visit with her on 6/15 and she would have labs collected as well. Patient expressed understanding and was thanked for her time and continued participation in the above mentioned study.   Carol Ada, RT(R)(T) Clinical Research Coordinator

## 2021-04-19 ENCOUNTER — Ambulatory Visit
Admission: RE | Admit: 2021-04-19 | Discharge: 2021-04-19 | Disposition: A | Discharge status: Home / Self Care | Payer: Medicare Other | Source: Ambulatory Visit | Attending: Radiation Oncology | Admitting: Radiation Oncology

## 2021-04-19 DIAGNOSIS — C3411 Malignant neoplasm of upper lobe, right bronchus or lung: Secondary | ICD-10-CM | POA: Diagnosis not present

## 2021-04-19 DIAGNOSIS — C7931 Secondary malignant neoplasm of brain: Secondary | ICD-10-CM | POA: Diagnosis not present

## 2021-04-19 DIAGNOSIS — Z006 Encounter for examination for normal comparison and control in clinical research program: Secondary | ICD-10-CM | POA: Diagnosis not present

## 2021-04-19 DIAGNOSIS — Z87891 Personal history of nicotine dependence: Secondary | ICD-10-CM | POA: Diagnosis not present

## 2021-04-20 ENCOUNTER — Ambulatory Visit
Admission: RE | Admit: 2021-04-20 | Discharge: 2021-04-20 | Disposition: A | Discharge status: Home / Self Care | Payer: Medicare Other | Source: Ambulatory Visit | Attending: Radiation Oncology | Admitting: Radiation Oncology

## 2021-04-20 ENCOUNTER — Encounter: Payer: Self-pay | Admitting: *Deleted

## 2021-04-20 ENCOUNTER — Other Ambulatory Visit: Payer: Self-pay | Admitting: *Deleted

## 2021-04-20 ENCOUNTER — Other Ambulatory Visit: Payer: Self-pay

## 2021-04-20 DIAGNOSIS — Z006 Encounter for examination for normal comparison and control in clinical research program: Secondary | ICD-10-CM | POA: Diagnosis not present

## 2021-04-20 DIAGNOSIS — Z87891 Personal history of nicotine dependence: Secondary | ICD-10-CM | POA: Diagnosis not present

## 2021-04-20 DIAGNOSIS — C3411 Malignant neoplasm of upper lobe, right bronchus or lung: Secondary | ICD-10-CM | POA: Diagnosis not present

## 2021-04-20 DIAGNOSIS — C7931 Secondary malignant neoplasm of brain: Secondary | ICD-10-CM | POA: Diagnosis not present

## 2021-04-20 NOTE — Progress Notes (Signed)
Called and spoke to patient. She is doing well after several days of Tagrisso. She reports no noticeable side effects. She continues radiation. She mentions that the side effects from steroids are present, but isn't interfering with daily activities. Although she states the entire radiation team is amazing, she is looking forward to a break soon.   Oncology Nurse Navigator Documentation  Oncology Nurse Navigator Flowsheets 04/20/2021  Abnormal Finding Date -  Confirmed Diagnosis Date -  Diagnosis Status -  Phase of Treatment Chemo  Chemotherapy Actual Start Date: 04/15/2021  Radiation Actual Start Date: -  Navigator Follow Up Date: 05/12/2021  Navigator Follow Up Reason: Follow-up Appointment  Navigator Location [facility]  Referral Date to RadOnc/MedOnc -  Navigator Encounter Type Telephone  Telephone Patient Update;Outgoing Call  Treatment Initiated Date -  Patient Visit Type MedOnc  Treatment Phase Active Tx  Barriers/Navigation Needs Coordination of Care;Education  Education -  Interventions Psycho-Social Support  Acuity Level 2-Minimal Needs (1-2 Barriers Identified)  Coordination of Care -  Education Method -  Support Groups/Services Friends and Family  Time Spent with Patient 30

## 2021-04-21 ENCOUNTER — Ambulatory Visit
Admission: RE | Admit: 2021-04-21 | Discharge: 2021-04-21 | Disposition: A | Discharge status: Home / Self Care | Payer: Medicare Other | Source: Ambulatory Visit | Attending: Radiation Oncology | Admitting: Radiation Oncology

## 2021-04-21 DIAGNOSIS — C3411 Malignant neoplasm of upper lobe, right bronchus or lung: Secondary | ICD-10-CM | POA: Diagnosis not present

## 2021-04-21 DIAGNOSIS — Z87891 Personal history of nicotine dependence: Secondary | ICD-10-CM | POA: Diagnosis not present

## 2021-04-21 DIAGNOSIS — C7931 Secondary malignant neoplasm of brain: Secondary | ICD-10-CM | POA: Diagnosis not present

## 2021-04-21 DIAGNOSIS — Z006 Encounter for examination for normal comparison and control in clinical research program: Secondary | ICD-10-CM | POA: Diagnosis not present

## 2021-04-22 ENCOUNTER — Ambulatory Visit
Admission: RE | Admit: 2021-04-22 | Discharge: 2021-04-22 | Disposition: A | Discharge status: Home / Self Care | Payer: Medicare Other | Source: Ambulatory Visit | Attending: Radiation Oncology | Admitting: Radiation Oncology

## 2021-04-22 ENCOUNTER — Telehealth: Payer: Self-pay | Admitting: Radiology

## 2021-04-22 ENCOUNTER — Other Ambulatory Visit: Payer: Self-pay

## 2021-04-22 DIAGNOSIS — C7931 Secondary malignant neoplasm of brain: Secondary | ICD-10-CM

## 2021-04-22 DIAGNOSIS — Z006 Encounter for examination for normal comparison and control in clinical research program: Secondary | ICD-10-CM | POA: Diagnosis not present

## 2021-04-22 DIAGNOSIS — C3411 Malignant neoplasm of upper lobe, right bronchus or lung: Secondary | ICD-10-CM | POA: Diagnosis not present

## 2021-04-22 DIAGNOSIS — Z87891 Personal history of nicotine dependence: Secondary | ICD-10-CM | POA: Diagnosis not present

## 2021-04-22 MED ORDER — SONAFINE EX EMUL
1.0000 | Freq: Once | CUTANEOUS | Status: AC
Start: 2021-04-22 — End: 2021-04-22
  Administered 2021-04-22: 1 via TOPICAL

## 2021-04-22 NOTE — Telephone Encounter (Addendum)
A PHASE III TRIAL OF STEREOTACTIC RADIOSURGERY COMPARED WITH HIPPOCAMPAL-AVOIDANT WHOLE BRAIN RADIOTHERAPY (HA-WBRT) PLUS MEMANTINE FOR 5-15 BRAIN METASTASES   04/22/2021   3:08PM  PHONE CALL: Left v/m for patient to remind her of starting PM dose of Memantine for the above mentioned study. Encouraged patient to call with any questions or concerns and thanked her for continued participation in the above mentioned study.   Carol Ada, RT(R)(T) Clinical Research Coordinator   RETURN PHONE CALL: Heidi Mclaughlin returned my call and confirmed she would take her PM dose beginning today. Patient was thanked for her time and for the return call.

## 2021-04-25 ENCOUNTER — Ambulatory Visit
Admission: RE | Admit: 2021-04-25 | Discharge: 2021-04-25 | Disposition: A | Discharge status: Home / Self Care | Payer: Medicare Other | Source: Ambulatory Visit | Attending: Radiation Oncology | Admitting: Radiation Oncology

## 2021-04-25 ENCOUNTER — Telehealth: Payer: Self-pay | Admitting: Radiology

## 2021-04-25 ENCOUNTER — Other Ambulatory Visit: Payer: Self-pay | Admitting: *Deleted

## 2021-04-25 ENCOUNTER — Other Ambulatory Visit: Payer: Self-pay

## 2021-04-25 DIAGNOSIS — C7931 Secondary malignant neoplasm of brain: Secondary | ICD-10-CM | POA: Diagnosis not present

## 2021-04-25 DIAGNOSIS — Z006 Encounter for examination for normal comparison and control in clinical research program: Secondary | ICD-10-CM | POA: Diagnosis not present

## 2021-04-25 DIAGNOSIS — Z87891 Personal history of nicotine dependence: Secondary | ICD-10-CM | POA: Diagnosis not present

## 2021-04-25 DIAGNOSIS — C3411 Malignant neoplasm of upper lobe, right bronchus or lung: Secondary | ICD-10-CM | POA: Diagnosis not present

## 2021-04-25 MED ORDER — DEXAMETHASONE 4 MG PO TABS: 4.0000 mg | ORAL_TABLET | Freq: Two times a day (BID) | ORAL | 1 refills | Status: AC

## 2021-04-25 NOTE — Telephone Encounter (Signed)
A PHASE III TRIAL OF STEREOTACTIC RADIOSURGERY COMPARED WITH HIPPOCAMPAL-AVOIDANT WHOLE BRAIN RADIOTHERAPY (HA-WBRT) PLUS MEMANTINE FOR 5-15 BRAIN METASTASES   04/25/2021   3:00PM  PHONE CALL: Received phone call from [facility] . Ms. Croy wanted to verify if research labs on Wednesday 6/15 (final RT) need to be fasting. This coordinator confirmed she does not need be fasting. Patient expressed understanding. Patient was thanked for her time and encouraged to call with any additional concerns or questions.   Carol Ada, RT(R)(T) Clinical Research Coordinator

## 2021-04-25 NOTE — Telephone Encounter (Signed)
Returned call to pt, Medication has been sent to pts pharmacy Pt thanked me for the call back

## 2021-04-26 ENCOUNTER — Ambulatory Visit
Admission: RE | Admit: 2021-04-26 | Discharge: 2021-04-26 | Disposition: A | Discharge status: Home / Self Care | Payer: Medicare Other | Source: Ambulatory Visit | Attending: Radiation Oncology | Admitting: Radiation Oncology

## 2021-04-26 DIAGNOSIS — C3411 Malignant neoplasm of upper lobe, right bronchus or lung: Secondary | ICD-10-CM | POA: Diagnosis not present

## 2021-04-26 DIAGNOSIS — C7931 Secondary malignant neoplasm of brain: Secondary | ICD-10-CM | POA: Diagnosis not present

## 2021-04-26 DIAGNOSIS — Z87891 Personal history of nicotine dependence: Secondary | ICD-10-CM | POA: Diagnosis not present

## 2021-04-26 DIAGNOSIS — Z006 Encounter for examination for normal comparison and control in clinical research program: Secondary | ICD-10-CM | POA: Diagnosis not present

## 2021-04-27 ENCOUNTER — Other Ambulatory Visit: Payer: Self-pay | Admitting: Radiation Therapy

## 2021-04-27 ENCOUNTER — Encounter: Payer: Self-pay | Admitting: *Deleted

## 2021-04-27 ENCOUNTER — Encounter: Payer: Self-pay | Admitting: Radiation Oncology

## 2021-04-27 ENCOUNTER — Ambulatory Visit
Admission: RE | Admit: 2021-04-27 | Discharge: 2021-04-27 | Disposition: A | Discharge status: Home / Self Care | Payer: Medicare Other | Source: Ambulatory Visit | Attending: Radiation Oncology | Admitting: Radiation Oncology

## 2021-04-27 ENCOUNTER — Other Ambulatory Visit: Payer: Self-pay

## 2021-04-27 ENCOUNTER — Inpatient Hospital Stay: Payer: Medicare Other

## 2021-04-27 DIAGNOSIS — C3491 Malignant neoplasm of unspecified part of right bronchus or lung: Secondary | ICD-10-CM | POA: Insufficient documentation

## 2021-04-27 DIAGNOSIS — C7931 Secondary malignant neoplasm of brain: Secondary | ICD-10-CM

## 2021-04-27 DIAGNOSIS — Z006 Encounter for examination for normal comparison and control in clinical research program: Secondary | ICD-10-CM

## 2021-04-27 DIAGNOSIS — Z87891 Personal history of nicotine dependence: Secondary | ICD-10-CM | POA: Diagnosis not present

## 2021-04-27 DIAGNOSIS — C3411 Malignant neoplasm of upper lobe, right bronchus or lung: Secondary | ICD-10-CM | POA: Diagnosis not present

## 2021-04-27 LAB — CMP (CANCER CENTER ONLY)
ALT: 27 U/L (ref 0–44)
AST: 16 U/L (ref 15–41)
Albumin: 3.6 g/dL (ref 3.5–5.0)
Alkaline Phosphatase: 36 U/L — ABNORMAL LOW (ref 38–126)
Anion gap: 10 (ref 5–15)
BUN: 20 mg/dL (ref 8–23)
CO2: 23 mmol/L (ref 22–32)
Calcium: 8.8 mg/dL — ABNORMAL LOW (ref 8.9–10.3)
Chloride: 96 mmol/L — ABNORMAL LOW (ref 98–111)
Creatinine: 0.77 mg/dL (ref 0.44–1.00)
GFR, Estimated: >60 mL/min (ref >60)
Glucose, Bld: 113 mg/dL — ABNORMAL HIGH (ref 70–99)
Potassium: 4.1 mmol/L (ref 3.5–5.1)
Sodium: 129 mmol/L — ABNORMAL LOW (ref 135–145)
Total Bilirubin: 0.4 mg/dL (ref 0.3–1.2)
Total Protein: 6 g/dL — ABNORMAL LOW (ref 6.5–8.1)

## 2021-04-27 LAB — RESEARCH LABS

## 2021-04-27 NOTE — Progress Notes (Signed)
Radiation Oncology         [phone] ________________________________  Name: Heidi Mclaughlin MRN: [id number]  Date: 04/27/2021  DOB: [birth year]  Follow-Up Visit Note  CC: Silverio Decamp, MD  No ref. provider found  Diagnosis:   Stage IV non-small cell lung cancer   Narrative:  The patient completed her course of radiation treatment to the brain today.  The patient states that she has done well overall.  She has 3 additional fractions of treatment to the right lung.  Patient denies any adverse events related to her course of radiation.  She denies any recent headaches, nausea, or vision changes.  She has continued to remain quite active.  She confirms once again today that she is right-handed.       ALLERGIES:  is allergic to amoxicillin, penicillins, pentazocine-naloxone hcl, and poison ivy extract [poison ivy extract].  Meds: Current Outpatient Medications  Medication Sig Dispense Refill   aspirin EC 81 MG tablet Take 81 mg by mouth daily. Swallow whole.     cetirizine (ZYRTEC) 10 MG tablet Take 10 mg by mouth daily.     CINNAMON PO Take 1,000 mg by mouth daily.     dexamethasone (DECADRON) 4 MG tablet Take 1 tablet (4 mg total) by mouth 2 (two) times daily. 30 tablet 1   folic acid (FOLVITE) 1 MG tablet TAKE 1 TABLET BY MOUTH EVERY DAY 90 tablet 3   hydrochlorothiazide (HYDRODIURIL) 25 MG tablet TAKE 1 TABLET BY MOUTH EVERY DAY (Patient taking differently: Take 25 mg by mouth daily.) 90 tablet 3   ketotifen (ZADITOR) 0.025 % ophthalmic solution Place 1 drop into both eyes 2 (two) times daily as needed (allergies).     loperamide (IMODIUM) 2 MG capsule Take 2 mg by mouth as needed for diarrhea or loose stools.     Lysine 1000 MG TABS Take 1,000 mg by mouth daily.     memantine (NAMENDA) 10 MG tablet Take 1 tablet (10 mg total) by mouth 2 (two) times daily. 60 tablet 4   memantine (NAMENDA) 5 MG tablet Take 1 tablet (5 mg total) by mouth daily. Begin this prescription the  first day of brain radiation. Week 1:  Take one tablet po qam. Week 2:  Take one tablet po qam and po qpm. Week 3:  Take two tablets po  qam and one tablet po qpm.  Week 4:  Take two tablets po qam and two tablets po qpm. Fill subsequent prescription q month. 70 tablet 0   montelukast (SINGULAIR) 10 MG tablet TAKE 1 TABLET BY MOUTH EVERYDAY AT BEDTIME 90 tablet 3   Multiple Minerals-Vitamins (CAL-MAG-ZINC-D) TABS Take 1 tablet by mouth daily.     Omega-3 Fatty Acids (FISH OIL ULTRA) 1400 MG CAPS Take 1,400 mg by mouth daily.     osimertinib mesylate (TAGRISSO) 80 MG tablet Take 1 tablet (80 mg total) by mouth daily. 30 tablet 6   Polyethyl Glycol-Propyl Glycol (SYSTANE) 0.4-0.3 % SOLN Place 1 drop into both eyes daily as needed (allergies).     rOPINIRole (REQUIP) 1 MG tablet TAKE 1 TABLET (1 MG TOTAL) BY MOUTH AT BEDTIME. 90 tablet 1   simvastatin (ZOCOR) 40 MG tablet TAKE 1 TABLET BY MOUTH EVERY DAY (Patient taking differently: Take 40 mg by mouth daily.) 90 tablet 3   valACYclovir (VALTREX) 500 MG tablet TAKE 1 TABLET (500 MG TOTAL) BY MOUTH AS NEEDED. 90 tablet 0   vitamin C (ASCORBIC ACID) 500 MG tablet Take 500  mg by mouth 2 (two) times daily.     No current facility-administered medications for this visit.    Physical Findings: Weight:  152.4 lbs Height:  5'2" Temperature: 97.9 Pulse: 77 Respirations: 20 Blood pressure: 147/88 Pulse Ox:  99%   General: Well-developed, in no acute distress HEENT: Normocephalic, atraumatic; oral cavity clear, mild erythremia in the scalp with minimal alopecia Neck: Supple without any lymphadenopathy Cardiovascular: Regular rate and rhythm Respiratory: Clear to auscultation bilaterally GI: Soft, nontender, normal bowel sounds Extremities: No edema present Neuro: No focal deficits   ECOG:  1   Lab Findings: Lab Results  Component Value Date   WBC 11.5 (H) 04/06/2021   HGB 14.7 04/06/2021   HCT 41.8 04/06/2021   MCV 84.8 04/06/2021   PLT  345 04/06/2021     Radiographic Findings: MR Brain W Wo Contrast  Result Date: 04/02/2021 CLINICAL DATA:  Non-small cell lung cancer. Known brain metastases. EXAM: MRI HEAD WITHOUT AND WITH CONTRAST TECHNIQUE: Multiplanar, multiecho pulse sequences of the brain and surrounding structures were obtained without and with intravenous contrast. CONTRAST:  40mL MULTIHANCE GADOBENATE DIMEGLUMINE 529 MG/ML IV SOLN COMPARISON:  MRI head without and with contrast 03/26/2021 at 1.5 tesla. FINDINGS: BRAIN Four additional lesions are identified. All measurements are given from the axial plane, series 11 1. Punctate lesion present on image 145 in the left frontal lobe. 2. 3 mm left parietal lesion on image 120 3. Punctate lesion in the left temporal lobe on image 92 4. 7 mm lesion in the left temporal lobe on image 160 The other 10 lesions are all confirmed. Measurements of several lesions are slightly larger than on the previous study, likely secondary to resolution differences at 3 tesla. 1. 3 mm lesion in the right occipital lobe on image 29 2. 2.4 cm lesion in the right temporal lobe on image 58 with stable appearance of hemorrhage in surrounding edema. 3. 21 mm lesion in the left occipital lobe on image 63. This lesion was underestimated in size on the prior exam. Intrinsic hemorrhage in surrounding edema is stable. This lesion exhibits the most extensive edema. 4. Left occipital lesion on image 72 is confirmed from the prior exam. 5. Right occipital lesion on image 100 measures 7 mm. 6. Right caudate lesion on image 100 measures 6 mm. 7. Right frontal lobe lesion on image 103 is confirmed, measuring 3 mm. 8. Medial left parietal lobe lesion on image 109 measures 16 mm. Intrinsic hemorrhage in surrounding edema is similar the prior exam. 9. Medial left parietal lesion on image 125 measures 12 mm. Intrinsic hemorrhage and surrounding edema is better appreciated on today's exam. 10. Left parietal lesion on image 139  measures 9 mm. Intrinsic hemorrhage and surrounding edema is better appreciated on today's exam. Other Brain findings: No acute infarct is present. Susceptibility on diffusion-weighted images is noted in association with the hemorrhagic lesions. The ventricles are of normal size. No significant extraaxial fluid collection is present. The internal auditory canals are within normal limits. The brainstem is within normal limits. Vascular: Flow is present in the major intracranial arteries. Basilar artery is small with fetal type posterior cerebral arteries. Skull and upper cervical spine: The craniocervical junction is normal. Upper cervical spine is within normal limits. Marrow signal is unremarkable. Sinuses/Orbits: The paranasal sinuses and mastoid air cells are clear. The globes and orbits are within normal limits. IMPRESSION: 1. Four additional lesions are identified as described. Total number of lesions is 14. 2. Measurements of several  lesions are slightly larger than on the prior exam, likely secondary to resolution differences at 3 tesla. 3. At least 5 of the lesions demonstrate evidence of hemorrhage. There is no significant change in the hemorrhage in any lesion. 4. Surrounding edema, most evident in the left occipital and parietal lesions, is similar to the prior exam. Electronically Signed   By: San Morelle M.D.   On: 04/02/2021 09:12   NM PET Image Initial (PI) Skull Base To Thigh  Result Date: 03/31/2021 CLINICAL DATA:  Initial treatment strategy for non-small cell lung cancer in a 65 year old female. EXAM: NUCLEAR MEDICINE PET SKULL BASE TO THIGH TECHNIQUE: 8.6 mCi F-18 FDG was injected intravenously. Full-ring PET imaging was performed from the skull base to thigh after the radiotracer. CT data was obtained and used for attenuation correction and anatomic localization. Fasting blood glucose: 107 mg/dl COMPARISON:  Recent CT of the chest of Mar 14, 2021. FINDINGS: Mediastinal blood pool  activity: SUV max 2.70 Liver activity: SUV max NA NECK: RIGHT level 4 B lymph node (image 39/3) maximum SUV of 4.3 measuring 8 mm. Incidental CT findings: Vasogenic edema in the RIGHT temporal lobe and posterior occipital lobe in the LEFT not well evaluated on the current study better seen on recent MRI in this patient with metastatic disease to the brain. CHEST: RIGHT thyroid nodule extending inferior to the RIGHT hemi thyroid measuring 2.4 x 2.2 cm with a maximum SUV of 7.9. This displayed hypervascular features on the previous chest CT. RIGHT hilar mass measuring 4.5 x 3.8 cm approximate axial dimension extending along the RIGHT hilum and along the medial RIGHT upper lobe adjacent to mediastinal fat and perhaps invading mediastinal fat adjacent to the SVC shows a maximum SUV of 12.2 and numerous small satellite nodules as demonstrated on the recent CT. Unchanged appearance of numerous bilateral nodules. Largest in the LEFT lower lobe on image 76 of series 4 shows a maximum SUV of 1.85. Ground-glass surrounding the dominant area in the RIGHT upper lobe is similar. Bronchovascular soft tissue in the RIGHT lower lobe on image 65 of series 4 measuring approximately 1.5 x 1.4 cm with similar appearance more ground-glass on a less motion limited study with a maximum SUV of 3.0. Subcarinal lymph node (image 61/4) 7 mm short axis with a maximum SUV of 5.1. Mildly enlarged precarinal lymph node (image 56/4 measuring 9 mm with a maximum SUV of 4.9 no LEFT hilar adenopathy. Small RIGHT paratracheal lymph node on image 48 of series 4 also mildly hypermetabolic. Incidental CT findings: Heart size normal. Aortic caliber is normal. Central pulmonary vasculature is normal caliber. Limited assessment of cardiovascular structures given lack of intravenous contrast. No acute pulmonary findings. ABDOMEN/PELVIS: No abnormal hypermetabolic activity within the liver, pancreas, adrenal glands, or spleen. No hypermetabolic lymph nodes in  the abdomen or pelvis. Incidental CT findings: Post cholecystectomy. No acute findings relative to liver, pancreas, spleen, adrenal glands, kidneys, stomach, small and large bowel. Colonic diverticulosis. Small RIGHT subcostal hernia contains fat. Post hysterectomy. SKELETON: No focal hypermetabolic activity to suggest skeletal metastasis. Incidental CT findings: none IMPRESSION: 1. Pulmonary mass at the RIGHT hilum with interstitial thickening and numerous satellite nodules as well as adenopathy in the chest and potentially in the low neck as well 2. Hypermetabolic RIGHT thyroid mass is hypervascular on CT imaging raising the question of a thyroid primary which does confound the lymph node in the RIGHT low neck. 3. Numerous pulmonary nodules remain compatible with pulmonary metastatic disease but with  limited FDG uptake. 4. Signs of metastatic disease the brain better evaluated on brain MRI. Electronically Signed   By: Zetta Bills M.D.   On: 03/31/2021 17:24    Impression:    The patient completed her course of cranial radiation today.  She has done very well.  No significant new complaints.  Plan: We will continue with treatment to the right lung and she has 3 additional fractions.  She has completed her course of whole brain radiation treatment to a dose of 30 Gray in 10 fractions.   Jodelle Gross, M.D., Ph.D.

## 2021-04-27 NOTE — Research (Signed)
A PHASE III TRIAL OF STEREOTACTIC RADIOSURGERY COMPARED WITH HIPPOCAMPAL-AVOIDANT WHOLE BRAIN RADIOTHERAPY (HA-WBRT) PLUS MEMANTINE FOR 5-15 BRAIN METASTASES    Patient arrivestoday@ Accompanied by her husband  for the End of Protocol Treatment visit.    PROs: None required this visit.   LABS: Mandatory and optional labs are collected per consent and study protocol: Patient Heidi Mclaughlin tolerated well without complaint.   MEDICATION REVIEW: Patient reviews and verifies the current medication list is correct.  She started taking Tagrisso on  04/13/21 and this is documented on medication record. Patient later mentioned she is taking imodium for heartburn symptoms and that was added to her medication list.  Patient confirmed she is continuing to take Memantine as directed per protocol.  VITAL SIGNS: Vital signs are collected per study protocol.  MD/PROVIDER VISIT: Patient sees Dr. Lisbeth Renshaw for today's visit.   ADVERSE EVENTS: Patient Heidi Mclaughlin reports dry skin mostly on her hands which started about 3 days after starting Tagrisso. She does not have any redness or itching and is using moisturizer regularly. She also reports new onset of heartburn starting about 2 weeks ago, right after starting on Tagrisso. She has taken immodium for this and states it helped. Instructed patient that immodium is for diarrhea and therefore may cause her constipation. Suggested she try other OTC remedies specifically for heartburn and notify Dr. Lisbeth Renshaw if it continues unrelieved. She verbalized understanding. She reports a hoarse voice for about 1 week. Patient is able to talk understandable with mild hoarseness. Patient also reports mild fatigue starting about 1 one week ago. This is not interfering with any ADLs.   See AE Log below.   ADVERSE EVENT LOGJakaylah Mclaughlin [id number]  04/27/2021  Adverse Event Log  Study/Protocol: A PHASE III TRIAL OF STEREOTACTIC RADIOSURGERY COMPARED WITH HIPPOCAMPAL-AVOIDANT  WHOLE BRAIN RADIOTHERAPY (HA-WBRT) PLUS MEMANTINE FOR 5-15 BRAIN METASTASES    Event Grade Onset Date Resolved Date Attribution to Memantine Attribution to WBRT Comments  Heartburn (GI, Other) Grade 1 04/14/2021 Ongoing Unrelated Unrelated   Dry Skin Grade 1 04/16/2021 Ongoing Unrelated Definite   Hoarseness Grade 1 04/20/2021 Ongoing Unrelated Unrelated   Fatigue Grade 1 6/082022 Ongoing Unrelated Probably     DISPOSITION:   The patient was thanked for their time and continued voluntary participation in this study. Patient Heidi Mclaughlin has been provided direct contact information and is encouraged to contact Carol Ada for any needs or questions. Informed patient that Hal Hope will call patient this Friday to assess and instruct on next dose increase.  Patient verbalized understanding.  Foye Spurling, BSN, RN Clinical Research Nurse 04/27/2021  ]

## 2021-04-28 ENCOUNTER — Encounter: Payer: Self-pay | Admitting: Radiology

## 2021-04-28 ENCOUNTER — Ambulatory Visit
Admission: RE | Admit: 2021-04-28 | Discharge: 2021-04-28 | Disposition: A | Discharge status: Home / Self Care | Payer: Medicare Other | Source: Ambulatory Visit | Attending: Radiation Oncology | Admitting: Radiation Oncology

## 2021-04-28 DIAGNOSIS — Z87891 Personal history of nicotine dependence: Secondary | ICD-10-CM | POA: Diagnosis not present

## 2021-04-28 DIAGNOSIS — C7931 Secondary malignant neoplasm of brain: Secondary | ICD-10-CM | POA: Diagnosis not present

## 2021-04-28 DIAGNOSIS — C3411 Malignant neoplasm of upper lobe, right bronchus or lung: Secondary | ICD-10-CM | POA: Diagnosis not present

## 2021-04-28 DIAGNOSIS — Z006 Encounter for examination for normal comparison and control in clinical research program: Secondary | ICD-10-CM | POA: Diagnosis not present

## 2021-04-28 NOTE — Progress Notes (Signed)
A PHASE III TRIAL OF STEREOTACTIC RADIOSURGERY COMPARED WITH HIPPOCAMPAL-AVOIDANT WHOLE BRAIN RADIOTHERAPY (HA-WBRT) PLUS MEMANTINE FOR 5-15 BRAIN METASTASES   04/28/2021    2:00PM  VISIT: Met with [name]  in the rad onc waiting area. Reminded patient of change in dose for Memantine starting tomorrow (Friday 04/29/21). Increase to whole tablet (60m) in the AM and continue 1/2 tablet (555m in the PM. Patient expressed understanding and was thanked for her time and continued participation in the above mentioned study. She reports doing well with no side effects from Memantine.  CaCarol AdaRT(R)(T) Clinical Research Coordinator

## 2021-04-28 NOTE — Telephone Encounter (Signed)
Patient picked up free 30 day sample of Tagrisso on 04/14/21.  I will follow up with patient about 10 days before she is due for her next fill to get her set up with [facility].  [place] Patient Council Phone [phone] Fax [phone] 04/28/2021 12:01 PM

## 2021-04-29 ENCOUNTER — Other Ambulatory Visit: Payer: Self-pay

## 2021-04-29 ENCOUNTER — Other Ambulatory Visit: Payer: Self-pay | Admitting: Radiation Oncology

## 2021-04-29 ENCOUNTER — Ambulatory Visit
Admission: RE | Admit: 2021-04-29 | Discharge: 2021-04-29 | Disposition: A | Discharge status: Home / Self Care | Payer: Medicare Other | Source: Ambulatory Visit | Attending: Radiation Oncology | Admitting: Radiation Oncology

## 2021-04-29 DIAGNOSIS — C7931 Secondary malignant neoplasm of brain: Secondary | ICD-10-CM | POA: Diagnosis not present

## 2021-04-29 DIAGNOSIS — Z006 Encounter for examination for normal comparison and control in clinical research program: Secondary | ICD-10-CM | POA: Diagnosis not present

## 2021-04-29 DIAGNOSIS — C3411 Malignant neoplasm of upper lobe, right bronchus or lung: Secondary | ICD-10-CM | POA: Diagnosis not present

## 2021-04-29 DIAGNOSIS — Z87891 Personal history of nicotine dependence: Secondary | ICD-10-CM | POA: Diagnosis not present

## 2021-04-29 MED ORDER — ONDANSETRON HCL 8 MG PO TABS: 8.0000 mg | ORAL_TABLET | Freq: Three times a day (TID) | ORAL | 1 refills | Status: AC | PRN

## 2021-04-29 NOTE — Progress Notes (Signed)
Prescription called in for Magic Mouthwash to [facility].    Prescription Equal parts of Extra strength Maalox, Diphenhydramine Liquid 12.5 mg/63mL, Nystatin Oral Suspension.  160 mL of each for total of 480 mL.  Gloriajean Dell. Leonie Green, BSN

## 2021-05-02 ENCOUNTER — Ambulatory Visit
Admission: RE | Admit: 2021-05-02 | Discharge: 2021-05-02 | Disposition: A | Discharge status: Home / Self Care | Payer: Medicare Other | Source: Ambulatory Visit | Attending: Radiation Oncology | Admitting: Radiation Oncology

## 2021-05-02 ENCOUNTER — Encounter: Payer: Self-pay | Admitting: Radiation Oncology

## 2021-05-02 DIAGNOSIS — Z006 Encounter for examination for normal comparison and control in clinical research program: Secondary | ICD-10-CM | POA: Diagnosis not present

## 2021-05-02 DIAGNOSIS — Z87891 Personal history of nicotine dependence: Secondary | ICD-10-CM | POA: Diagnosis not present

## 2021-05-02 DIAGNOSIS — C3411 Malignant neoplasm of upper lobe, right bronchus or lung: Secondary | ICD-10-CM | POA: Diagnosis not present

## 2021-05-02 DIAGNOSIS — C7931 Secondary malignant neoplasm of brain: Secondary | ICD-10-CM | POA: Diagnosis not present

## 2021-05-03 ENCOUNTER — Ambulatory Visit: Payer: Medicare Other

## 2021-05-04 ENCOUNTER — Ambulatory Visit: Payer: Medicare Other

## 2021-05-05 ENCOUNTER — Ambulatory Visit: Payer: Medicare Other

## 2021-05-05 ENCOUNTER — Ambulatory Visit: Payer: Self-pay | Admitting: Radiation Oncology

## 2021-05-05 ENCOUNTER — Other Ambulatory Visit: Payer: Self-pay | Admitting: Radiation Oncology

## 2021-05-06 ENCOUNTER — Ambulatory Visit: Payer: Medicare Other

## 2021-05-06 NOTE — Telephone Encounter (Signed)
xxx

## 2021-05-09 ENCOUNTER — Other Ambulatory Visit: Payer: Self-pay | Admitting: Radiation Oncology

## 2021-05-11 ENCOUNTER — Telehealth: Payer: Self-pay | Admitting: Radiology

## 2021-05-11 NOTE — Progress Notes (Signed)
                                                                                                                                                             Patient Name: Heidi Mclaughlin MRN: [id number] DOB: 12/30/1955 Referring Physician: Aundria Mems (Profile Not Attached) Date of Service: 05/02/2021 [facility], [place]                                                        End Of Treatment Note  Diagnoses: C34.11-Malignant neoplasm of upper lobe, right bronchus or lung C79.31-Secondary malignant neoplasm of brain  Cancer Staging: Stage IV, NSCLC, adenocarcinoma of the RUL with brain disease  Intent: Palliative  Radiation Treatment Dates: 04/12/2021 through 05/02/2021 Site Technique Total Dose (Gy) Dose per Fx (Gy) Completed Fx Beam Energies  Brain: Brain IMRT 30/30 3 10/10 6X  Lung, Right: Lung_Rt 3D 37.5/37.5 2.5 15/15 6X, 10X, 15X   Narrative: The patient tolerated radiation therapy relatively well.   Plan: The patient will receive a call in about one month from the radiation oncology department. She will continue follow up with Dr. Marin Olp as well.   ________________________________________________    Carola Rhine, [facility]

## 2021-05-11 NOTE — Telephone Encounter (Signed)
A PHASE III TRIAL OF STEREOTACTIC RADIOSURGERY COMPARED WITH HIPPOCAMPAL-AVOIDANT WHOLE BRAIN RADIOTHERAPY (HA-WBRT) PLUS MEMANTINE FOR 5-15 BRAIN METASTASES   05/11/2021    12:50PM  PHONE CALL: Confirmed I was speaking with Heidi Mclaughlin. Informed [name] reason for call was to exchange Memantine diaries tomorrow, 05/12/2021 for the next 4 weeks which begins on 05/13/2021. Patient stated they would come to the cancer center after their morning appts. Thanked patient for her time and continued support of the above mentioned study.   Carol Ada, RT(R)(T) Clinical Research Coordinator

## 2021-05-12 ENCOUNTER — Telehealth: Payer: Self-pay | Admitting: Radiology

## 2021-05-12 ENCOUNTER — Encounter: Payer: Self-pay | Admitting: *Deleted

## 2021-05-12 ENCOUNTER — Emergency Department (HOSPITAL_COMMUNITY): Payer: Medicare Other

## 2021-05-12 ENCOUNTER — Inpatient Hospital Stay: Payer: Medicare Other

## 2021-05-12 ENCOUNTER — Other Ambulatory Visit (HOSPITAL_COMMUNITY): Payer: Self-pay

## 2021-05-12 ENCOUNTER — Emergency Department (HOSPITAL_COMMUNITY)
Admission: EM | Admit: 2021-05-12 | Discharge: 2021-05-12 | Disposition: A | Discharge status: Home / Self Care | Payer: Medicare Other | Attending: Emergency Medicine | Admitting: Emergency Medicine

## 2021-05-12 ENCOUNTER — Inpatient Hospital Stay: Payer: Medicare Other | Admitting: Hematology & Oncology

## 2021-05-12 DIAGNOSIS — Z87891 Personal history of nicotine dependence: Secondary | ICD-10-CM | POA: Diagnosis not present

## 2021-05-12 DIAGNOSIS — C7931 Secondary malignant neoplasm of brain: Secondary | ICD-10-CM | POA: Diagnosis not present

## 2021-05-12 DIAGNOSIS — Z79899 Other long term (current) drug therapy: Secondary | ICD-10-CM | POA: Insufficient documentation

## 2021-05-12 DIAGNOSIS — C719 Malignant neoplasm of brain, unspecified: Secondary | ICD-10-CM | POA: Diagnosis not present

## 2021-05-12 DIAGNOSIS — R410 Disorientation, unspecified: Secondary | ICD-10-CM | POA: Insufficient documentation

## 2021-05-12 DIAGNOSIS — R791 Abnormal coagulation profile: Secondary | ICD-10-CM | POA: Insufficient documentation

## 2021-05-12 DIAGNOSIS — Z7982 Long term (current) use of aspirin: Secondary | ICD-10-CM | POA: Insufficient documentation

## 2021-05-12 DIAGNOSIS — I10 Essential (primary) hypertension: Secondary | ICD-10-CM | POA: Diagnosis not present

## 2021-05-12 DIAGNOSIS — C3411 Malignant neoplasm of upper lobe, right bronchus or lung: Secondary | ICD-10-CM | POA: Insufficient documentation

## 2021-05-12 DIAGNOSIS — R456 Violent behavior: Secondary | ICD-10-CM | POA: Diagnosis not present

## 2021-05-12 DIAGNOSIS — R404 Transient alteration of awareness: Secondary | ICD-10-CM | POA: Diagnosis not present

## 2021-05-12 DIAGNOSIS — R569 Unspecified convulsions: Secondary | ICD-10-CM | POA: Diagnosis not present

## 2021-05-12 DIAGNOSIS — R4182 Altered mental status, unspecified: Secondary | ICD-10-CM | POA: Diagnosis present

## 2021-05-12 DIAGNOSIS — G9389 Other specified disorders of brain: Secondary | ICD-10-CM | POA: Diagnosis not present

## 2021-05-12 LAB — CBC WITH DIFFERENTIAL/PLATELET
Abs Immature Granulocytes: 0.07 10*3/uL (ref 0.00–0.07)
Basophils Absolute: 0 10*3/uL (ref 0.0–0.1)
Basophils Relative: 0 %
Eosinophils Absolute: 0.1 10*3/uL (ref 0.0–0.5)
Eosinophils Relative: 1 %
HCT: 40.9 % (ref 36.0–46.0)
Hemoglobin: 14.1 g/dL (ref 12.0–15.0)
Immature Granulocytes: 1 %
Lymphocytes Relative: 4 %
Lymphs Abs: 0.3 10*3/uL — ABNORMAL LOW (ref 0.7–4.0)
MCH: 30.6 pg (ref 26.0–34.0)
MCHC: 34.5 g/dL (ref 30.0–36.0)
MCV: 88.7 fL (ref 80.0–100.0)
Monocytes Absolute: 0.5 10*3/uL (ref 0.1–1.0)
Monocytes Relative: 6 %
Neutro Abs: 7.1 10*3/uL (ref 1.7–7.7)
Neutrophils Relative %: 88 %
Platelets: 175 10*3/uL (ref 150–400)
RBC: 4.61 MIL/uL (ref 3.87–5.11)
RDW: 14.6 % (ref 11.5–15.5)
WBC: 8.1 10*3/uL (ref 4.0–10.5)
nRBC: 0 % (ref 0.0–0.2)

## 2021-05-12 LAB — COMPREHENSIVE METABOLIC PANEL
ALT: 28 U/L (ref 0–44)
AST: 18 U/L (ref 15–41)
Albumin: 3.5 g/dL (ref 3.5–5.0)
Alkaline Phosphatase: 31 U/L — ABNORMAL LOW (ref 38–126)
Anion gap: 12 (ref 5–15)
BUN: 24 mg/dL — ABNORMAL HIGH (ref 8–23)
CO2: 22 mmol/L (ref 22–32)
Calcium: 8.7 mg/dL — ABNORMAL LOW (ref 8.9–10.3)
Chloride: 95 mmol/L — ABNORMAL LOW (ref 98–111)
Creatinine, Ser: 0.9 mg/dL (ref 0.44–1.00)
GFR, Estimated: >60 mL/min (ref >60)
Glucose, Bld: 84 mg/dL (ref 70–99)
Potassium: 4 mmol/L (ref 3.5–5.1)
Sodium: 129 mmol/L — ABNORMAL LOW (ref 135–145)
Total Bilirubin: 0.8 mg/dL (ref 0.3–1.2)
Total Protein: 5.9 g/dL — ABNORMAL LOW (ref 6.5–8.1)

## 2021-05-12 LAB — I-STAT CHEM 8, ED
BUN: 26 mg/dL — ABNORMAL HIGH (ref 8–23)
Calcium, Ion: 1.15 mmol/L (ref 1.15–1.40)
Chloride: 94 mmol/L — ABNORMAL LOW (ref 98–111)
Creatinine, Ser: 0.8 mg/dL (ref 0.44–1.00)
Glucose, Bld: 84 mg/dL (ref 70–99)
HCT: 42 % (ref 36.0–46.0)
Hemoglobin: 14.3 g/dL (ref 12.0–15.0)
Potassium: 3.8 mmol/L (ref 3.5–5.1)
Sodium: 128 mmol/L — ABNORMAL LOW (ref 135–145)
TCO2: 22 mmol/L (ref 22–32)

## 2021-05-12 LAB — LACTIC ACID, PLASMA
Lactic Acid, Venous: 3.8 mmol/L (ref 0.5–1.9)
Lactic Acid, Venous: 2.2 mmol/L (ref 0.5–1.9)

## 2021-05-12 LAB — PROTIME-INR
INR: 0.9 (ref 0.8–1.2)
Prothrombin Time: 11.9 seconds (ref 11.4–15.2)

## 2021-05-12 LAB — CBG MONITORING, ED: Glucose-Capillary: 84 mg/dL (ref 70–99)

## 2021-05-12 MED ORDER — LEVETIRACETAM 500 MG PO TABS: 500.0000 mg | ORAL_TABLET | Freq: Two times a day (BID) | ORAL | 2 refills | Status: AC

## 2021-05-12 MED ORDER — SODIUM CHLORIDE 0.9 % IV SOLN: Freq: Once | INTRAVENOUS | Status: DC

## 2021-05-12 MED ORDER — LEVETIRACETAM IN NACL 1000 MG/100ML IV SOLN: 1000.0000 mg | Freq: Once | INTRAVENOUS | Status: DC

## 2021-05-12 MED ORDER — LORAZEPAM 2 MG/ML IJ SOLN
1.0000 mg | Freq: Once | INTRAMUSCULAR | Status: AC
  Administered 2021-05-12: 1 mg via INTRAVENOUS
  Filled 2021-05-12: qty 1

## 2021-05-12 MED ORDER — GADOBUTROL 1 MMOL/ML IV SOLN
6.0000 mL | Freq: Once | INTRAVENOUS | Status: AC | PRN
  Administered 2021-05-12: 6 mL via INTRAVENOUS

## 2021-05-12 MED ORDER — LORAZEPAM 2 MG/ML IJ SOLN
1.0000 mg | Freq: Once | INTRAMUSCULAR | Status: AC | PRN
  Administered 2021-05-12: 1 mg via INTRAVENOUS
  Filled 2021-05-12: qty 1

## 2021-05-12 MED ORDER — SODIUM CHLORIDE 0.9 % IV BOLUS: 250.0000 mL | Freq: Once | INTRAVENOUS | Status: DC

## 2021-05-12 MED ORDER — SODIUM CHLORIDE 0.9 % IV SOLN
2000.0000 mg | Freq: Once | INTRAVENOUS | Status: AC
  Administered 2021-05-12: 2000 mg via INTRAVENOUS
  Filled 2021-05-12: qty 20

## 2021-05-12 NOTE — ED Notes (Signed)
H/o brain cancer. Questionable sz. Constant dorsiflexion, L hand tremor, repetitive statements ongoing for ~2 hours, moments of clarity and confusion.  Arrives resistant to care, and at times cooperative.

## 2021-05-12 NOTE — Progress Notes (Signed)
Received a message that Richardson Landry, Kim's husband has some questions about the [place]. Kristiane only has three pills left and she missed this morning's dose.  Spoke to Dr Marin Olp.  Informed Richardson Landry that for now we would like her to continue Tagrisso as prescribed. I have let the pharmacy team at the specialty pharmacy know they need a refill. Also, explained that they do not need to worry about today's dose, and to just restart tomorrow. Richardson Landry knows I will call him tomorrow to follow up on ED visit and reschedule todays appointment.   Oncology Nurse Navigator Documentation  Oncology Nurse Navigator Flowsheets 05/12/2021  Abnormal Finding Date -  Confirmed Diagnosis Date -  Diagnosis Status -  Phase of Treatment -  Chemotherapy Actual Start Date: -  Radiation Actual Start Date: -  Navigator Follow Up Date: 05/13/2021  Navigator Follow Up Reason: Appointment Review  Navigator Location [facility]  Referral Date to RadOnc/MedOnc -  Navigator Encounter Type Telephone;Appt/Treatment Plan Review  Telephone Medication Assistance;Outgoing Call  Treatment Initiated Date -  Patient Visit Type MedOnc  Treatment Phase Active Tx  Barriers/Navigation Needs Coordination of Care;Education  Education -  Interventions Education;Psycho-Social Support  Acuity Level 2-Minimal Needs (1-2 Barriers Identified)  Coordination of Care -  Education Method Verbal  Support Groups/Services -  Time Spent with Patient 30  Genetic Counseling Type None

## 2021-05-12 NOTE — Discharge Instructions (Addendum)
1.  You are being started on medication called Keppra.  You can fill the prescription later this evening and then take your first dose late tonight.  Then start taking it twice daily tomorrow. 2.  You need follow-up with a neurologist.  You will need further testing.  If you are not already being treated with a neurologist, speak with Dr. Marin Olp about a referral.  You also have been given a referral that you may use if you choose to [facility].  You can call them to schedule an appointment hopefully within the next week. 3.  Try to get plenty of rest.  Stay hydrated and eat nutritious food.  Return to the emergency department if you develop any worsening or concerning symptoms.

## 2021-05-12 NOTE — ED Notes (Signed)
Patient transported to MRI 

## 2021-05-12 NOTE — Progress Notes (Signed)
Received notification from [place] that patient's husband called EMS due to patient having confusion/delirium. Patient was scheduled for routine follow up today in the office.   Patient was brought to [facility] ED and is being assessed for confusion/delirium, agitation and periods of resisting care followed by cooperation. Mention of possible seizure activity.  Will continue to follow for post discharge needs and office follow up.   Oncology Nurse Navigator Documentation  Oncology Nurse Navigator Flowsheets 05/12/2021  Abnormal Finding Date -  Confirmed Diagnosis Date -  Diagnosis Status -  Phase of Treatment -  Chemotherapy Actual Start Date: -  Radiation Actual Start Date: -  Navigator Follow Up Date: 05/13/2021  Navigator Follow Up Reason: Appointment Review  Navigator Location [facility]  Referral Date to RadOnc/MedOnc -  Navigator Encounter Type MyChart;Appt/Treatment Plan Review  Telephone -  Treatment Initiated Date -  Patient Visit Type MedOnc  Treatment Phase Active Tx  Barriers/Navigation Needs Coordination of Care;Education  Education -  Interventions None Required  Acuity Level 2-Minimal Needs (1-2 Barriers Identified)  Coordination of Care -  Education Method -  Support Groups/Services Friends and Family  Time Spent with Patient 30

## 2021-05-12 NOTE — ED Provider Notes (Signed)
[facility] EMERGENCY DEPARTMENT Provider Note   CSN: [id number] Arrival date & time: 05/12/21  6578     History Chief Complaint  Patient presents with   Altered Mental Status    Heidi Mclaughlin is a 65 y.o. female.  HPI Patient has metastatic adenocarcinoma of the lung.  She has brain metastases being treated with radiation therapy.  EMR indicates that last therapy was on 6\20\2022.  Treatment was palliative.  Patient does have metastatic disease in May identified with multiple pulmonary nodules and lymphadenopathy in the hilum of the mediastinum.  She is taking Tagrisso managed by Dr. Marin Olp.  Patient's husband reports that yesterday the patient was somewhat confused.  She had orders to wigs and going out with her sister-in-law to purchase some.  Several things have occurred such as the patient tried to pay the clerk several times for the weeks had already been paid for.  She also was giving her sister-in-law driving instructions which were incorrect.  Her sister-in-law complied however so as not to upset the patient.  Patient's husband reports that about 430 this morning she got out of bed and sat on the couch and started is repeating herself over and over again.  She was agitated and upset.  This continued throughout all of the morning until she presented to the emergency department.  She is repeating things such as "I cannot do this anymore. Fuck, make them stop.  Richardson Landry, make them stop I cannot do this".  Patient's husband denies that they have conversations where the patient expressed a desire to discontinue all treatment.     Past Medical History:  Diagnosis Date   Allergy    Allergy-induced asthma    Arthritis    Goals of care, counseling/discussion 04/06/2021   Hyperlipidemia    Hypertension    Irregular heart rhythm    RLS (restless legs syndrome)     Patient Active Problem List   Diagnosis Date Noted   Goals of care, counseling/discussion 04/06/2021    Brain metastases ([place]) 03/29/2021   Malignant neoplasm of upper lobe of right lung ([place]) 03/07/2021   Right hip pain 01/24/2021   Pansinusitis 02/09/2020   Upper airway cough syndrome 07/10/2018   PVC's (premature ventricular contractions) 08/07/2017   Hemorrhoids, external 12/07/2015   Trigger finger, acquired 11/17/2014   Persistent insomnia 08/26/2014   Diverticulosis 08/26/2014   Benign essential hypertension 12/29/2013   Spondylolisthesis of lumbar region 08/07/2013   Seasonal allergies 04/24/2013   Hyperlipidemia 04/24/2013   Restless legs 04/24/2013   Primary osteoarthritis of left knee 04/24/2013   Annual physical exam 04/24/2013   History of blepharoplasty 04/22/2013    Past Surgical History:  Procedure Laterality Date   ABDOMINAL HYSTERECTOMY  2003   APPENDECTOMY  1982   BRONCHIAL BIOPSY  03/22/2021   Procedure: BRONCHIAL BIOPSIES;  Surgeon: Garner Nash, DO;  Location: [place] [facility];  Service: Pulmonary;;   BRONCHIAL BRUSHINGS  03/22/2021   Procedure: BRONCHIAL BRUSHINGS;  Surgeon: Garner Nash, DO;  Location: [place] [facility];  Service: Pulmonary;;   BRONCHIAL NEEDLE ASPIRATION BIOPSY  03/22/2021   Procedure: BRONCHIAL NEEDLE ASPIRATION BIOPSIES;  Surgeon: Garner Nash, DO;  Location: [facility];  Service: Pulmonary;;   BRONCHIAL WASHINGS  03/22/2021   Procedure: BRONCHIAL WASHINGS;  Surgeon: Garner Nash, DO;  Location: [place] [facility];  Service: Pulmonary;;   [place]  2003   [place]  TUBAL LIGATION  1992   VIDEO BRONCHOSCOPY WITH ENDOBRONCHIAL ULTRASOUND N/A 03/22/2021   Procedure: VIDEO BRONCHOSCOPY WITH ENDOBRONCHIAL ULTRASOUND;  Surgeon: Garner Nash, DO;  Location: [place];  Service: Pulmonary;  Laterality: N/A;     OB History     Gravida  2   Para  2   Term  2   Preterm      AB      Living         SAB      IAB      Ectopic       Multiple      Live Births  2           Family History  Problem Relation Age of Onset   Cancer Mother        lung   Hypertension Father    Heart attack Father    Pancreatic cancer Paternal Uncle    Breast cancer Sister    Colon cancer Neg Hx    Esophageal cancer Neg Hx    Rectal cancer Neg Hx    Stomach cancer Neg Hx     Social History   Tobacco Use   Smoking status: Former    Pack years: 0.00    Types: Cigarettes    Quit date: 11/13/1972    Years since quitting: 48.5   Smokeless tobacco: Never   Tobacco comments:    Smoked for less than one  year.  Vaping Use   Vaping Use: Never used  Substance Use Topics   Alcohol use: Yes    Alcohol/week: 1.0 standard drink    Types: 1 Glasses of wine per week    Comment: once a week   Drug use: No    Home Medications Prior to Admission medications   Medication Sig Start Date End Date Taking? Authorizing Provider  levETIRAcetam (KEPPRA) 500 MG tablet Take 1 tablet (500 mg total) by mouth 2 (two) times daily. 05/12/21  Yes Charlesetta Shanks, MD  aspirin EC 81 MG tablet Take 81 mg by mouth daily. Swallow whole.    [provider]  cetirizine (ZYRTEC) 10 MG tablet Take 10 mg by mouth daily.    [provider]  CINNAMON PO Take 1,000 mg by mouth daily.    [provider]  dexamethasone (DECADRON) 4 MG tablet Take 1 tablet (4 mg total) by mouth 2 (two) times daily. 04/25/21   Volanda Napoleon, MD  folic acid (FOLVITE) 1 MG tablet TAKE 1 TABLET BY MOUTH EVERY DAY 04/07/21   Silverio Decamp, MD  hydrochlorothiazide (HYDRODIURIL) 25 MG tablet TAKE 1 TABLET BY MOUTH EVERY DAY Patient taking differently: Take 25 mg by mouth daily. 01/10/21   Silverio Decamp, MD  ketotifen (ZADITOR) 0.025 % ophthalmic solution Place 1 drop into both eyes 2 (two) times daily as needed (allergies).    [provider]  loperamide (IMODIUM) 2 MG capsule Take 2 mg by mouth as needed for diarrhea or loose  stools. 04/14/21   [provider]  Lysine 1000 MG TABS Take 1,000 mg by mouth daily.    [provider]  memantine (NAMENDA) 10 MG tablet Take 1 tablet (10 mg total) by mouth 2 (two) times daily. 04/13/21   Kyung Rudd, MD  memantine (NAMENDA) 5 MG tablet Take 1 tablet (5 mg total) by mouth daily. Begin this prescription the first day of brain radiation. Week 1:  Take one tablet po qam. Week 2:  Take one tablet  po qam and po qpm. Week 3:  Take two tablets po  qam and one tablet po qpm.  Week 4:  Take two tablets po qam and two tablets po qpm. Fill subsequent prescription q month. 04/13/21   Kyung Rudd, MD  montelukast (SINGULAIR) 10 MG tablet TAKE 1 TABLET BY MOUTH EVERYDAY AT BEDTIME 04/07/21   Silverio Decamp, MD  Multiple Minerals-Vitamins (CAL-MAG-ZINC-D) TABS Take 1 tablet by mouth daily.    [provider]  Omega-3 Fatty Acids (FISH OIL ULTRA) 1400 MG CAPS Take 1,400 mg by mouth daily.    [provider]  ondansetron (ZOFRAN) 8 MG tablet Take 1 tablet (8 mg total) by mouth every 8 (eight) hours as needed for nausea or vomiting. 04/29/21   Kyung Rudd, MD  osimertinib mesylate (TAGRISSO) 80 MG tablet Take 1 tablet (80 mg total) by mouth daily. 04/06/21   Volanda Napoleon, MD  Polyethyl Glycol-Propyl Glycol (SYSTANE) 0.4-0.3 % SOLN Place 1 drop into both eyes daily as needed (allergies).    [provider]  rOPINIRole (REQUIP) 1 MG tablet TAKE 1 TABLET (1 MG TOTAL) BY MOUTH AT BEDTIME. 01/10/21   Silverio Decamp, MD  simvastatin (ZOCOR) 40 MG tablet TAKE 1 TABLET BY MOUTH EVERY DAY Patient taking differently: Take 40 mg by mouth daily. 01/10/21   Silverio Decamp, MD  valACYclovir (VALTREX) 500 MG tablet TAKE 1 TABLET (500 MG TOTAL) BY MOUTH AS NEEDED. 01/09/19   Silverio Decamp, MD  vitamin C (ASCORBIC ACID) 500 MG tablet Take 500 mg by mouth 2 (two) times daily.    [provider]    Allergies    Amoxicillin, Penicillins,  Pentazocine-naloxone hcl, and Poison ivy extract [poison ivy extract]  Review of Systems   Review of Systems 10 systems reviewed and negative except as per HPI, review of systems initially with the patient's husband.  Patient is very agitated Physical Exam Updated Vital Signs BP 103/63   Pulse 63   Temp 97.6 F (36.4 C) (Axillary)   Resp (!) 30   SpO2 96%   Physical Exam Constitutional:      Comments: Patient is very alert in appearance.  On initial arrival no respiratory distress but patient having a repetitive litany.  Moderately agitated.  HENT:     Head:     Comments: Surgical scars on forehead.    Mouth/Throat:     Pharynx: Oropharynx is clear.  Eyes:     Extraocular Movements: Extraocular movements intact.  Cardiovascular:     Rate and Rhythm: Normal rate and regular rhythm.  Pulmonary:     Effort: Pulmonary effort is normal.     Breath sounds: Normal breath sounds.  Abdominal:     General: There is no distension.     Palpations: Abdomen is soft.  Musculoskeletal:        General: No swelling or tenderness.     Cervical back: Neck supple.     Right lower leg: No edema.     Left lower leg: No edema.  Skin:    General: Skin is warm and dry.  Neurological:     Comments: Patient has having very repetitive sentences.  She does not engage in other conversation to any extent.  She is not being cooperative with examination but refusing to perform her testing at this time.  She is holding her arms and legs fairly stiff but not tonic-clonic activity.    ED Results / Procedures / Treatments   Labs (  all labs ordered are listed, but only abnormal results are displayed) Labs Reviewed  COMPREHENSIVE METABOLIC PANEL - Abnormal; Notable for the following components:      Result Value   Sodium 129 (*)    Chloride 95 (*)    BUN 24 (*)    Calcium 8.7 (*)    Total Protein 5.9 (*)    Alkaline Phosphatase 31 (*)    All other components within normal limits  CBC WITH  DIFFERENTIAL/PLATELET - Abnormal; Notable for the following components:   Lymphs Abs 0.3 (*)    All other components within normal limits  LACTIC ACID, PLASMA - Abnormal; Notable for the following components:   Lactic Acid, Venous 3.8 (*)    All other components within normal limits  LACTIC ACID, PLASMA - Abnormal; Notable for the following components:   Lactic Acid, Venous 2.2 (*)    All other components within normal limits  I-STAT CHEM 8, ED - Abnormal; Notable for the following components:   Sodium 128 (*)    Chloride 94 (*)    BUN 26 (*)    All other components within normal limits  CULTURE, BLOOD (ROUTINE X 2)  CULTURE, BLOOD (ROUTINE X 2)  PROTIME-INR  URINALYSIS, ROUTINE W REFLEX MICROSCOPIC  CBG MONITORING, ED    EKG None  Radiology MR Brain W and Wo Contrast  Result Date: 05/12/2021 CLINICAL DATA:  Brain/CNS neoplasm, surveillance. EXAM: MRI HEAD WITHOUT AND WITH CONTRAST TECHNIQUE: Multiplanar, multiecho pulse sequences of the brain and surrounding structures were obtained without and with intravenous contrast. COMPARISON:  MRI of the brain Apr 01, 2021. FINDINGS: The study is partially degraded by motion, particularly the postcontrast images which are essentially nondiagnostic. Brain: No acute infarction, hemorrhage, hydrocephalus, or extra-axial collection. Foci of susceptibility artifact in the right temporal lobe, right caudate head bilateral parietal and occipital lobes related to treated metastases. Interval resolution of edema surrounding these lesions which are grossly decreased in size when compared to prior MRI evaluation for contrast enhancement limited due to motion artifact on postcontrast images but no gross focus of contrast enhancement identified. Vascular: Normal flow voids. Skull and upper cervical spine: Normal marrow signal. Sinuses/Orbits: Negative. IMPRESSION: 1. No acute intracranial abnormality identified. 2. Interval resolution of edema surrounding right  temporal and bilateral parietooccipital lesions which are grossly decreased size, suggesting treatment response. However, accurate evaluation of contrast enhancement of these lesions as well as smaller lesions described on prior MRI is very limited due to motion artifacts on postcontrast images. Repeat scan when clinically appropriate suggested for further evaluation. Electronically Signed   By: Pedro Earls M.D.   On: 05/12/2021 15:14    Procedures Procedures  CRITICAL CARE Performed by: Charlesetta Shanks   Total critical care time: 30  minutes  Critical care time was exclusive of separately billable procedures and treating other patients.  Critical care was necessary to treat or prevent imminent or life-threatening deterioration.  Critical care was time spent personally by me on the following activities: development of treatment plan with patient and/or surrogate as well as nursing, discussions with consultants, evaluation of patient's response to treatment, examination of patient, obtaining history from patient or surrogate, ordering and performing treatments and interventions, ordering and review of laboratory studies, ordering and review of radiographic studies, pulse oximetry and re-evaluation of patient's condition.  Medications Ordered in ED Medications  sodium chloride 0.9 % bolus 250 mL (has no administration in time range)  0.9 %  sodium chloride infusion (has  no administration in time range)  LORazepam (ATIVAN) injection 1 mg (1 mg Intravenous Given 05/12/21 1000)  LORazepam (ATIVAN) injection 1 mg (1 mg Intravenous Given 05/12/21 1346)  levETIRAcetam (KEPPRA) 2,000 mg in sodium chloride 0.9 % 250 mL IVPB (0 mg Intravenous Stopped 05/12/21 1349)  gadobutrol (GADAVIST) 1 MMOL/ML injection 6 mL (6 mLs Intravenous Contrast Given 05/12/21 1455)    ED Course  I have reviewed the triage vital signs and the nursing notes.  Pertinent labs & imaging results that were  available during my care of the patient were reviewed by me and considered in my medical decision making (see chart for details).  Clinical Course as of 05/12/21 1546  Thu May 12, 2021  1104 Patient is dramatically improved.  She has stopped repetitive speech and now is situationally oriented and calm. [MP]    Clinical Course User Index [MP] Charlesetta Shanks, MD   MDM Rules/Calculators/A&P                          Consult: Reviewed with Dr. Malen Gauze neurology.  Reviewed the patient's presentation and her history.  At this time significant suspicion for seizure with immediate response to Ativan.  Plan will be to repeat MRIs for any significant changes and lesions and initiate a Keppra bolus at 2000 mg.  If MRIs are stable and patient remains back at baseline, anticipate discharge with Keppra 500 twice daily.  If patient does not exhibit any other symptoms, can get EEG on outpatient basis.  Patient presented with symptom of repetitive, constant sentences and some general stiffness.  This responded very well to a milligram of Ativan with patient reverting to normal mental status.  I had suspicion for focal seizure.  Patient has known multiple metastatic lesions in the brain.  I reviewed this with neurology.  At this time plan is for MRI which has returned without any worsening and some improvement.  Patient's mental status remains clear.  Will initiate Keppra as planned.  Careful return precautions reviewed.  Final Clinical Impression(s) / ED Diagnoses Final diagnoses:  Metastatic adenocarcinoma to brain [facility])  Seizure ([place])    Rx / DC Orders ED Discharge Orders          Ordered    levETIRAcetam (KEPPRA) 500 MG tablet  2 times daily        05/12/21 1543    Ambulatory referral to Neurology       Comments: An appointment is requested in approximately: 1 week   05/12/21 1544             Charlesetta Shanks, MD 05/12/21 1550

## 2021-05-12 NOTE — ED Notes (Signed)
RN reviewed discharge instructions w/ pt and her husband. Prescriptions and follow up reviewed, no further questions

## 2021-05-12 NOTE — ED Triage Notes (Signed)
BIB [facility] after husband called to report that pt was having increase agitation/combativeness. According to EMS, Pt receives radiation and chemo for brain cancer. Pt exhibited similar behavior 2 days, roughly two days after pt received radiation but was normal the next morning. Pt able to follow commands and answer questions. Pt agitated on arrival to hospital.   Hx HTN, brain cancer, hypothyroidism

## 2021-05-12 NOTE — ED Notes (Signed)
Pt with excessively repetitive statements. Follows some commands. Resistant of some commands/ care. States, stop, make them stop Richardson Landry, and "M...F...", Also says thanked EMS for their care. After >20 minutes of ongoing statements, she also said "I can't keep doing this", and decreased.

## 2021-05-12 NOTE — ED Notes (Signed)
EDP into room 

## 2021-05-12 NOTE — Telephone Encounter (Signed)
A PHASE III TRIAL OF STEREOTACTIC RADIOSURGERY COMPARED WITH HIPPOCAMPAL-AVOIDANT WHOLE BRAIN RADIOTHERAPY (HA-WBRT) PLUS MEMANTINE FOR 5-15 BRAIN METASTASES   05/12/2021   8:37AM  PHONE CALL: Received call from [name], patients husband to inform that he called 911 due to patient seeming confused/delirious. Patient was transported to the ED and this coordinator will continue to follow for updates. William Dalton for his call and informed providers at [facility] of patient status.   Carol Ada, RT(R)(T) Clinical Research Coordinator

## 2021-05-12 NOTE — ED Notes (Signed)
Completely lucid at this time. Alert, NAD, calm, interactive. Asking if we can decrease steroids as she thinks this is effecting her, mentions her hearing has decreased and was thinking about getting hearing aids (but was asked to wait d/t radiation per husband), she also asked if she was going to get any more imaging today. Verbalizes thanks.

## 2021-05-13 ENCOUNTER — Telehealth: Payer: Self-pay | Admitting: Radiology

## 2021-05-13 ENCOUNTER — Encounter: Payer: Self-pay | Admitting: *Deleted

## 2021-05-13 ENCOUNTER — Other Ambulatory Visit (HOSPITAL_COMMUNITY): Payer: Self-pay

## 2021-05-13 NOTE — Telephone Encounter (Signed)
A PHASE III TRIAL OF STEREOTACTIC RADIOSURGERY COMPARED WITH HIPPOCAMPAL-AVOIDANT WHOLE BRAIN RADIOTHERAPY (HA-WBRT) PLUS MEMANTINE FOR 5-15 BRAIN METASTASES   05/13/2021   1:00PM  PHONE CALL: Spoke with Richardson Landry, patient's husband to check on them after ED visit yesterday, 05/12/2021. Richardson Landry said Heidi Mclaughlin is doing okay. He stated she is very tired, but mood is back to baseline. Richardson Landry did not have any immediate needs or concerns. Informed him, he could call this coordinator at any time and that this coordinator would speak to them again next week to check in. Richardson Landry thanked me for my call.   Carol Ada, RT(R)(T) Clinical Research Coordinator   MD ATTRIBUTION: Dr. Lisbeth Renshaw and Dr. Isidore Moos are both off site, but this coordinator messaged Dr. Tammi Klippel for attribution of seizure/ED visit to patients treatment. Per Dr. Tammi Klippel "My opinion is that a seizure is a risk from the underlying condition of brain metastases and not a treatment-induced toxicity." Plan on having MD sign attribution later today. No need for SAE reporting.

## 2021-05-13 NOTE — Progress Notes (Signed)
Spoke with Richardson Landry, patient's husband. Patient was discharged yesterday afternoon. She went to bed around 530p and slept until this morning. She is doing better. Nothing significant was found during workup, and it is assumed she had a focal seizure. She was sent home on Keppra and will follow up with neurology.  Spoke with Dr Marin Olp and he would like to see her next week. Husband is aware of appointment including date and time.   Oncology Nurse Navigator Documentation  Oncology Nurse Navigator Flowsheets 05/13/2021  Abnormal Finding Date -  Confirmed Diagnosis Date -  Diagnosis Status -  Phase of Treatment -  Chemotherapy Actual Start Date: -  Radiation Actual Start Date: -  Navigator Follow Up Date: 05/17/2021  Navigator Follow Up Reason: Follow-up Appointment  Navigator Location [facility]  Referral Date to RadOnc/MedOnc -  Navigator Encounter Type Appt/Treatment Plan Review;Telephone  Telephone Appt Confirmation/Clarification;Outgoing Call  Treatment Initiated Date -  Patient Visit Type MedOnc  Treatment Phase Active Tx  Barriers/Navigation Needs Coordination of Care;Education  Education -  Interventions Coordination of Care;Education;Psycho-Social Support  Acuity Level 2-Minimal Needs (1-2 Barriers Identified)  Coordination of Care Appts  Education Method Verbal  Support Groups/Services Friends and Family  Time Spent with Patient 30

## 2021-05-14 ENCOUNTER — Other Ambulatory Visit (HOSPITAL_COMMUNITY): Payer: Self-pay

## 2021-05-17 ENCOUNTER — Other Ambulatory Visit: Payer: Self-pay

## 2021-05-17 ENCOUNTER — Encounter: Payer: Self-pay | Admitting: *Deleted

## 2021-05-17 ENCOUNTER — Encounter: Payer: Self-pay | Admitting: Hematology & Oncology

## 2021-05-17 ENCOUNTER — Inpatient Hospital Stay: Payer: Medicare Other | Attending: Hematology & Oncology

## 2021-05-17 ENCOUNTER — Inpatient Hospital Stay: Payer: Medicare Other

## 2021-05-17 ENCOUNTER — Inpatient Hospital Stay (HOSPITAL_BASED_OUTPATIENT_CLINIC_OR_DEPARTMENT_OTHER): Payer: Medicare Other | Admitting: Hematology & Oncology

## 2021-05-17 VITALS — BP 102/68 | HR 95 | Temp 98.1°F | Resp 20 | Wt 138.0 lb

## 2021-05-17 DIAGNOSIS — C3491 Malignant neoplasm of unspecified part of right bronchus or lung: Secondary | ICD-10-CM | POA: Insufficient documentation

## 2021-05-17 DIAGNOSIS — C7931 Secondary malignant neoplasm of brain: Secondary | ICD-10-CM | POA: Diagnosis not present

## 2021-05-17 DIAGNOSIS — E860 Dehydration: Secondary | ICD-10-CM | POA: Diagnosis not present

## 2021-05-17 DIAGNOSIS — E039 Hypothyroidism, unspecified: Secondary | ICD-10-CM | POA: Diagnosis not present

## 2021-05-17 DIAGNOSIS — C3411 Malignant neoplasm of upper lobe, right bronchus or lung: Secondary | ICD-10-CM

## 2021-05-17 LAB — CBC WITH DIFFERENTIAL (CANCER CENTER ONLY)
Abs Immature Granulocytes: 0.02 10*3/uL (ref 0.00–0.07)
Basophils Absolute: 0 10*3/uL (ref 0.0–0.1)
Basophils Relative: 1 %
Eosinophils Absolute: 0.1 10*3/uL (ref 0.0–0.5)
Eosinophils Relative: 2 %
HCT: 36.6 % (ref 36.0–46.0)
Hemoglobin: 13.1 g/dL (ref 12.0–15.0)
Immature Granulocytes: 0 %
Lymphocytes Relative: 5 %
Lymphs Abs: 0.3 10*3/uL — ABNORMAL LOW (ref 0.7–4.0)
MCH: 30.8 pg (ref 26.0–34.0)
MCHC: 35.8 g/dL (ref 30.0–36.0)
MCV: 86.1 fL (ref 80.0–100.0)
Monocytes Absolute: 0.4 10*3/uL (ref 0.1–1.0)
Monocytes Relative: 7 %
Neutro Abs: 4.2 10*3/uL (ref 1.7–7.7)
Neutrophils Relative %: 85 %
Platelet Count: 121 10*3/uL — ABNORMAL LOW (ref 150–400)
RBC: 4.25 MIL/uL (ref 3.87–5.11)
RDW: 13.9 % (ref 11.5–15.5)
WBC Count: 5 10*3/uL (ref 4.0–10.5)
nRBC: 0 % (ref 0.0–0.2)

## 2021-05-17 LAB — CMP (CANCER CENTER ONLY)
ALT: 28 U/L (ref 0–44)
AST: 21 U/L (ref 15–41)
Albumin: 3.7 g/dL (ref 3.5–5.0)
Alkaline Phosphatase: 40 U/L (ref 38–126)
Anion gap: 12 (ref 5–15)
BUN: 24 mg/dL — ABNORMAL HIGH (ref 8–23)
CO2: 25 mmol/L (ref 22–32)
Calcium: 9.3 mg/dL (ref 8.9–10.3)
Chloride: 91 mmol/L — ABNORMAL LOW (ref 98–111)
Creatinine: 0.89 mg/dL (ref 0.44–1.00)
GFR, Estimated: >60 mL/min (ref >60)
Glucose, Bld: 103 mg/dL — ABNORMAL HIGH (ref 70–99)
Potassium: 3.7 mmol/L (ref 3.5–5.1)
Sodium: 128 mmol/L — ABNORMAL LOW (ref 135–145)
Total Bilirubin: 0.7 mg/dL (ref 0.3–1.2)
Total Protein: 6.1 g/dL — ABNORMAL LOW (ref 6.5–8.1)

## 2021-05-17 LAB — CULTURE, BLOOD (ROUTINE X 2)
Culture: NO GROWTH
Culture: NO GROWTH
Special Requests: ADEQUATE

## 2021-05-17 LAB — LACTATE DEHYDROGENASE: LDH: 256 U/L — ABNORMAL HIGH (ref 98–192)

## 2021-05-17 MED ORDER — SODIUM CHLORIDE 0.9 % IV SOLN
Freq: Once | INTRAVENOUS | Status: AC
  Filled 2021-05-17: qty 250

## 2021-05-17 MED ORDER — SODIUM CHLORIDE 0.9 % IV SOLN
INTRAVENOUS | Status: AC
Start: 2021-05-17 — End: 2021-05-17
  Filled 2021-05-17: qty 250

## 2021-05-17 NOTE — Patient Instructions (Signed)
Dehydration, Adult Dehydration is condition in which there is not enough water or other fluids in the body. This happens when a person loses more fluids than he or she takes in. Important body parts cannot work right without the right amount of fluids. Anyloss of fluids from the body can cause dehydration. Dehydration can be mild, worse, or very bad. It should be treated right away tokeep it from getting very bad. What are the causes? This condition may be caused by: Conditions that cause loss of water or other fluids, such as: Watery poop (diarrhea). Vomiting. Sweating a lot. Peeing (urinating) a lot. Not drinking enough fluids, especially when you: Are ill. Are doing things that take a lot of energy to do. Other illnesses and conditions, such as fever or infection. Certain medicines, such as medicines that take extra fluid out of the body (diuretics). Lack of safe drinking water. Not being able to get enough water and food. What increases the risk? The following factors may make you more likely to develop this condition: Having a long-term (chronic) illness that has not been treated the right way, such as: Diabetes. Heart disease. Kidney disease. Being 4 years of age or older. Having a disability. Living in a place that is high above the ground or sea (high in altitude). The thinner, dried air causes more fluid loss. Doing exercises that put stress on your body for a long time. What are the signs or symptoms? Symptoms of dehydration depend on how bad it is. Mild or worse dehydration Thirst. Dry lips or dry mouth. Feeling dizzy or light-headed, especially when you stand up from sitting. Muscle cramps. Your body making: Dark pee (urine). Pee may be the color of tea. Less pee than normal. Less tears than normal. Headache. Very bad dehydration Changes in skin. Skin may: Be cold to the touch (clammy). Be blotchy or pale. Not go back to normal right after you lightly pinch it  and let it go. Little or no tears, pee, or sweat. Changes in vital signs, such as: Fast breathing. Low blood pressure. Weak pulse. Pulse that is more than 100 beats a minute when you are sitting still. Other changes, such as: Feeling very thirsty. Eyes that look hollow (sunken). Cold hands and feet. Being mixed up (confused). Being very tired (lethargic) or having trouble waking from sleep. Short-term weight loss. Loss of consciousness. How is this treated? Treatment for this condition depends on how bad it is. Treatment should start right away. Do not wait until your condition gets very bad. Very bad dehydration is an emergency. You will need to go to a hospital. Mild or worse dehydration can be treated at home. You may be asked to: Drink more fluids. Drink an oral rehydration solution (ORS). This drink helps get the right amounts of fluids and salts and minerals in the blood (electrolytes). Very bad dehydration can be treated: With fluids through an IV tube. By getting normal levels of salts and minerals in your blood. This is often done by giving salts and minerals through a tube. The tube is passed through your nose and into your stomach. By treating the root cause. Follow these instructions at home: Oral rehydration solution If told by your doctor, drink an ORS: Make an ORS. Use instructions on the package. Start by drinking small amounts, about  cup (120 mL) every 5-10 minutes. Slowly drink more until you have had the amount that your doctor said to have. Eating and drinking  Drink enough clear fluid to keep your pee pale yellow. If you were told to drink an ORS, finish the ORS first. Then, start slowly drinking other clear fluids. Drink fluids such as: Water. Do not drink only water. Doing that can make the salt (sodium) level in your body get too low. Water from ice chips you suck on. Fruit juice that you have added water to (diluted). Low-calorie sports  drinks. Eat foods that have the right amounts of salts and minerals, such as: Bananas. Oranges. Potatoes. Tomatoes. Spinach. Do not drink alcohol. Avoid: Drinks that have a lot of sugar. These include: High-calorie sports drinks. Fruit juice that you did not add water to. Soda. Caffeine. Foods that are greasy or have a lot of fat or sugar. General instructions Take over-the-counter and prescription medicines only as told by your doctor. Do not take salt tablets. Doing that can make the salt level in your body get too high. Return to your normal activities as told by your doctor. Ask your doctor what activities are safe for you. Keep all follow-up visits as told by your doctor. This is important. Contact a doctor if: You have pain in your belly (abdomen) and the pain: Gets worse. Stays in one place. You have a rash. You have a stiff neck. You get angry or annoyed (irritable) more easily than normal. You are more tired or have a harder time waking than normal. You feel: Weak or dizzy. Very thirsty. Get help right away if you have: Any symptoms of very bad dehydration. Symptoms of vomiting, such as: You cannot eat or drink without vomiting. Your vomiting gets worse or does not go away. Your vomit has blood or green stuff in it. Symptoms that get worse with treatment. A fever. A very bad headache. Problems with peeing or pooping (having a bowel movement), such as: Watery poop that gets worse or does not go away. Blood in your poop (stool). This may cause poop to look black and tarry. Not peeing in 6-8 hours. Peeing only a small amount of very dark pee in 6-8 hours. Trouble breathing. These symptoms may be an emergency. Do not wait to see if the symptoms will go away. Get medical help right away. Call your local emergency services (911 in the [place] Do not drive yourself to the hospital. Summary Dehydration is a condition in which there is not enough water or other fluids  in the body. This happens when a person loses more fluids than he or she takes in. Treatment for this condition depends on how bad it is. Treatment should be started right away. Do not wait until your condition gets very bad. Drink enough clear fluid to keep your pee pale yellow. If you were told to drink an oral rehydration solution (ORS), finish the ORS first. Then, start slowly drinking other clear fluids. Take over-the-counter and prescription medicines only as told by your doctor. Get help right away if you have any symptoms of very bad dehydration. This information is not intended to replace advice given to you by your health care provider. Make sure you discuss any questions you have with your healthcare provider. Document Revised: 06/12/2019 Document Reviewed: 06/12/2019 Elsevier Patient Education  [place].

## 2021-05-17 NOTE — Progress Notes (Signed)
Hematology and Oncology Follow Up Visit  Heidi Mclaughlin [id number] 07-04-1956 65 y.o. 05/17/2021   Principle Diagnosis:  Metastatic adenocarcinoma of the lung-EGFR mutated (T790M/L858R)  Current Therapy:   Tagrisso 80 mg p.o. daily --start on 04/13/2021 Radiation therapy for CNS metastasis     Interim History:  Heidi Mclaughlin is back for follow-up.  Unfortunately, we really in a bind.  She recently had to go the emergency room.  She had neurological issues.  She soon as always have had a seizure.  She had a very thorough evaluation in the ER.  She had MRI which actually showed that the brain mets were little bit better.  There is no bleed.  There is no obvious meningeal enhancement.  She was put on antiseizure medications.  She has not yet seen a neurologist.  I am not sure exactly what is happened to her.  She can barely walk.  She is not eating much.  She clearly has had some kind of neurological episode.  She had been on Tagrisso.  I think she started this in early June.  I have not seen Tagrisso do anything like this.  Given her that she has a bad mutation with her lung cancer, I do not think we have a lot of options that we can consider for substituting Tagrisso.  She is definitely dehydrated..  She is not having any kind of bowel or bladder incontinence.  There is a little bit of a cough.  There is no productive sputum.  She has had no fever.  She has had no bleeding.  I still have to believe that she has had some kind of neurological event.  She will take her steroids.  Thankfully, the MRI did not show any edema.  She has had no rashes.  She has had no obvious vomiting.  Again, I am absolutely amazed as to what is happened to her.  She had been doing well and then all of a sudden she developed this neurological issue.  Her husband said that he try to get into the shower and she would not let him.  She was very ill with him.  He finally had a call EMS.  I know she has not  yet had any kind of LP.  I do not think this would really be all that helpful right now.  Currently, I would have to say her performance status is probably ECOG 2-3.   Medications:  Current Outpatient Medications:    aspirin EC 81 MG tablet, Take 81 mg by mouth daily. Swallow whole., Disp: , Rfl:    cetirizine (ZYRTEC) 10 MG tablet, Take 10 mg by mouth daily., Disp: , Rfl:    CINNAMON PO, Take 1,000 mg by mouth daily., Disp: , Rfl:    dexamethasone (DECADRON) 4 MG tablet, Take 1 tablet (4 mg total) by mouth 2 (two) times daily., Disp: 30 tablet, Rfl: 1   folic acid (FOLVITE) 1 MG tablet, TAKE 1 TABLET BY MOUTH EVERY DAY, Disp: 90 tablet, Rfl: 3   hydrochlorothiazide (HYDRODIURIL) 25 MG tablet, TAKE 1 TABLET BY MOUTH EVERY DAY (Patient taking differently: Take 25 mg by mouth daily.), Disp: 90 tablet, Rfl: 3   ketotifen (ZADITOR) 0.025 % ophthalmic solution, Place 1 drop into both eyes 2 (two) times daily as needed (allergies)., Disp: , Rfl:    levETIRAcetam (KEPPRA) 500 MG tablet, Take 1 tablet (500 mg total) by mouth 2 (two) times daily., Disp: 60 tablet, Rfl: 2   loperamide (IMODIUM) 2  MG capsule, Take 2 mg by mouth as needed for diarrhea or loose stools., Disp: , Rfl:    Lysine 1000 MG TABS, Take 1,000 mg by mouth daily., Disp: , Rfl:    memantine (NAMENDA) 10 MG tablet, Take 1 tablet (10 mg total) by mouth 2 (two) times daily., Disp: 60 tablet, Rfl: 4   memantine (NAMENDA) 5 MG tablet, Take 1 tablet (5 mg total) by mouth daily. Begin this prescription the first day of brain radiation. Week 1:  Take one tablet po qam. Week 2:  Take one tablet po qam and po qpm. Week 3:  Take two tablets po  qam and one tablet po qpm.  Week 4:  Take two tablets po qam and two tablets po qpm. Fill subsequent prescription q month., Disp: 70 tablet, Rfl: 0   montelukast (SINGULAIR) 10 MG tablet, TAKE 1 TABLET BY MOUTH EVERYDAY AT BEDTIME, Disp: 90 tablet, Rfl: 3   Multiple Minerals-Vitamins (CAL-MAG-ZINC-D) TABS,  Take 1 tablet by mouth daily., Disp: , Rfl:    Omega-3 Fatty Acids (FISH OIL ULTRA) 1400 MG CAPS, Take 1,400 mg by mouth daily., Disp: , Rfl:    ondansetron (ZOFRAN) 8 MG tablet, Take 1 tablet (8 mg total) by mouth every 8 (eight) hours as needed for nausea or vomiting., Disp: 30 tablet, Rfl: 1   osimertinib mesylate (TAGRISSO) 80 MG tablet, Take 1 tablet (80 mg total) by mouth daily., Disp: 30 tablet, Rfl: 6   Polyethyl Glycol-Propyl Glycol (SYSTANE) 0.4-0.3 % SOLN, Place 1 drop into both eyes daily as needed (allergies)., Disp: , Rfl:    rOPINIRole (REQUIP) 1 MG tablet, TAKE 1 TABLET (1 MG TOTAL) BY MOUTH AT BEDTIME., Disp: 90 tablet, Rfl: 1   simvastatin (ZOCOR) 40 MG tablet, TAKE 1 TABLET BY MOUTH EVERY DAY (Patient taking differently: Take 40 mg by mouth daily.), Disp: 90 tablet, Rfl: 3   valACYclovir (VALTREX) 500 MG tablet, TAKE 1 TABLET (500 MG TOTAL) BY MOUTH AS NEEDED., Disp: 90 tablet, Rfl: 0   vitamin C (ASCORBIC ACID) 500 MG tablet, Take 500 mg by mouth 2 (two) times daily., Disp: , Rfl:   Allergies:  Allergies  Allergen Reactions   Amoxicillin Anaphylaxis   Penicillins Anaphylaxis   Pentazocine-Naloxone Hcl Anxiety and Other (See Comments)    insomnia     Poison Ivy Extract [Poison Ivy Extract] Rash    Past Medical History, Surgical history, Social history, and Family History were reviewed and updated.  Review of Systems: Review of Systems  Constitutional:  Positive for appetite change, fatigue and unexpected weight change.  HENT:   Positive for hearing loss.   Eyes: Negative.   Respiratory:  Positive for cough.   Cardiovascular: Negative.   Gastrointestinal: Negative.   Endocrine: Negative.   Genitourinary: Negative.    Skin: Negative.   Neurological:  Positive for dizziness, extremity weakness, seizures and speech difficulty.  Hematological: Negative.   Psychiatric/Behavioral: Negative.     Physical Exam:  weight is 138 lb (62.6 kg). Her oral temperature is  98.1 F (36.7 C). Her blood pressure is 102/68 and her pulse is 95. Her respiration is 20 and oxygen saturation is 99%.   Wt Readings from Last 3 Encounters:  05/17/21 138 lb (62.6 kg)  04/27/21 152 lb 6.4 oz (69.1 kg)  04/06/21 149 lb (67.6 kg)    Physical Exam Vitals reviewed.  HENT:     Head: Normocephalic and atraumatic.  Eyes:     Pupils: Pupils are equal, round, and  reactive to light.  Cardiovascular:     Rate and Rhythm: Normal rate and regular rhythm.     Heart sounds: Normal heart sounds.  Pulmonary:     Effort: Pulmonary effort is normal.     Breath sounds: Normal breath sounds.  Abdominal:     General: Bowel sounds are normal.     Palpations: Abdomen is soft.  Musculoskeletal:        General: No tenderness or deformity. Normal range of motion.     Cervical back: Normal range of motion.  Lymphadenopathy:     Cervical: No cervical adenopathy.  Skin:    General: Skin is warm and dry.     Findings: No erythema or rash.  Neurological:     Mental Status: She is alert and oriented to person, place, and time.  Psychiatric:        Behavior: Behavior normal.        Thought Content: Thought content normal.        Judgment: Judgment normal.   Lab Results  Component Value Date   WBC 5.0 05/17/2021   HGB 13.1 05/17/2021   HCT 36.6 05/17/2021   MCV 86.1 05/17/2021   PLT 121 (L) 05/17/2021     Chemistry      Component Value Date/Time   NA 128 (L) 05/17/2021 0744   K 3.7 05/17/2021 0744   CL 91 (L) 05/17/2021 0744   CO2 25 05/17/2021 0744   BUN 24 (H) 05/17/2021 0744   CREATININE 0.89 05/17/2021 0744   CREATININE 0.92 10/20/2020 0822      Component Value Date/Time   CALCIUM 9.3 05/17/2021 0744   ALKPHOS 40 05/17/2021 0744   AST 21 05/17/2021 0744   ALT 28 05/17/2021 0744   BILITOT 0.7 05/17/2021 0744      Impression and Plan: Heidi Mclaughlin is a very charming 65 year old white female.  She has metastatic non-small cell lung cancer.  She has a adverse  mutation.  We have her on [place] for this.  She had radiation therapy to the brain.  Again I am not sure exactly what is happened to her.  Again I had believe this is some kind of neurological event.  I still do not see anything on the MRI of the brain that looks worse.  There is no bleeding.  There is no fluid.  There is no edema.  Again have not seen Tagrisso do anything like this.  I have asked our pharmacist to see if she can find anything.  So far, she has not been able to find anything that would implicate Tagrisso as the problem.  Regardless, I have her stop with the [place] for right now.  I will going give her some IV fluids today.  Hopefully this will help make her feel a little bit better.  Hopefully will help with her appetite.  I totally would never have expected anything like this.  She looked so good when we first saw her.  I really thought that she would do well with the Tagrisso and radiation therapy to the brain.  I do realize that some people can have issues with cranial radiation.  Typically I think this happens during radiation and not afterwards.  We are going to have to watch her very closely.  I am probably going to have to have her come back to see me in a couple weeks, if not sooner.  I just hate this for Heidi Mclaughlin.  Again when I first saw her  and we started treatment she was in great shape.  She was exercising.  She was working.  Now she seems totally different.  She certainly looks much younger.  She is in tremendous shape.  She is a non-smoker.  She has metastatic adenocarcinoma of the right lung.  The tumor has a EGFR mutation.  She is going start radiation therapy.  We will see about getting the [place] for her.  Hopefully, she will be able to start the [place] in a week or so.  I would not think that this would affect the radiation.  I know that Tagrisso can penetrate the blood-brain barrier and get into the brain.  I really believe that she should do  quite well.  I suspect that she should have a good response of over 75%.  I would not do another scan on her probably for about 3 months.  I would like to see how everything goes with the Tagrisso and brain radiation.  I will see her back myself in about a month or so.  By then, she should be done with radiation and should be on Tagrisso.   Volanda Napoleon, MD 7/5/20224:31 PM

## 2021-05-17 NOTE — Progress Notes (Signed)
Met with patient prior to her MD visit. She is doing very poorly. Her husband believes her decline started prior to her seizure, but the seizure made everything much worse. Her vision and hearing are poor. She has had headaches for several days. She is very weak and her husband states she sleeps in bed all day. She isn't eating and drinking enough. She comes in today in a wheelchair. She will see Dr Marin Olp and then be seen in infusion for IVF.  Oncology Nurse Navigator Documentation  Oncology Nurse Navigator Flowsheets 05/17/2021  Abnormal Finding Date -  Confirmed Diagnosis Date -  Diagnosis Status -  Phase of Treatment -  Chemotherapy Actual Start Date: -  Radiation Actual Start Date: -  Navigator Follow Up Date: 06/01/2021  Navigator Follow Up Reason: Follow-up Appointment  Navigator Location [facility]  Referral Date to RadOnc/MedOnc -  Navigator Encounter Type Follow-up Appt  Telephone -  Treatment Initiated Date -  Patient Visit Type MedOnc  Treatment Phase Active Tx  Barriers/Navigation Needs Coordination of Care;Education  Education Pain/ Symptom Management  Interventions Education;Psycho-Social Support  Acuity Level 2-Minimal Needs (1-2 Barriers Identified)  Coordination of Care -  Education Method Verbal  Support Groups/Services Friends and Family  Time Spent with Patient 30

## 2021-05-18 ENCOUNTER — Encounter: Payer: Self-pay | Admitting: *Deleted

## 2021-05-18 ENCOUNTER — Telehealth: Payer: Self-pay | Admitting: Radiology

## 2021-05-18 NOTE — Telephone Encounter (Signed)
A PHASE III TRIAL OF STEREOTACTIC RADIOSURGERY COMPARED WITH HIPPOCAMPAL-AVOIDANT WHOLE BRAIN RADIOTHERAPY (HA-WBRT) PLUS MEMANTINE FOR 5-15 BRAIN METASTASES   05/18/2021    11:45AM  PHONE CALL: Spoke with patient's husband, Heidi Mclaughlin. Informed Heidi Mclaughlin reason for call was to check in on status of Heidi Mclaughlin. Heidi Mclaughlin states [name] has been a little better since IV fluids yesterday, but has had diarrhea and not wanting to eat or drink as much. States she has had a rough time since seizure on 05/12/21. Informed Heidi Mclaughlin to hold Memantine usage due to recent lab values (BUN + creatinine clearance) and to not begin again until further lab work has been arranged. They have planned a trip to the beach with family beginning this weekend (05/21/21) until potentially next Friday (05/27/21). Per protocol, lab work should be obtained a week after Memantine is stopped to check on BUN and creatinine, but patient will be out of town. Plan on getting lab work completed once patient returns. Informed Heidi Mclaughlin that I will send new Memantine diary (weeks 5-8) and at his convenience he can return the diary for weeks 1-4 via the pre-paid envelope included in sent documents. Heidi Mclaughlin had no questions at this time and understands to call if anything changes or he has any questions or concerns. I hope they have a wonderful time at the beach and the plan is to contact them on Monday 7/18 to plan for a lab appointment. Heidi Mclaughlin was thanked for his time.  Carol Ada, RT(R)(T) Clinical Research Coordinator

## 2021-05-18 NOTE — Progress Notes (Signed)
Received a call from the patient's husband. He would like a calendar of patient appointments mailed to his home. Will print calendar and place in mail today.   Heidi Mclaughlin also asks about bringing the patient to the beach this weekend to visit family. He wants to know if Dr Marin Olp will be okay with him going to the beach, with a hired CNA. Heidi Mclaughlin really wants to go and see her grandchildren.  Spoke to Dr Marin Olp. He says she is fine with patient going. He would like her to continue to hold her Tagrisso until she returns.   Reviewed the above with Heidi Mclaughlin and he confirms teaching with teach back. He knows to call if he has any questions or concerns.   Oncology Nurse Navigator Documentation  Oncology Nurse Navigator Flowsheets 05/18/2021  Abnormal Finding Date -  Confirmed Diagnosis Date -  Diagnosis Status -  Phase of Treatment -  Chemotherapy Actual Start Date: -  Radiation Actual Start Date: -  Navigator Follow Up Date: 06/01/2021  Navigator Follow Up Reason: Follow-up Appointment  Navigator Location [facility]  Referral Date to RadOnc/MedOnc -  Navigator Encounter Type Telephone  Telephone Appt Confirmation/Clarification;Education;Incoming Call  Treatment Initiated Date -  Patient Visit Type MedOnc  Treatment Phase Active Tx  Barriers/Navigation Needs Coordination of Care;Education  Education Other  Interventions Coordination of Care;Education;Psycho-Social Support  Acuity Level 2-Minimal Needs (1-2 Barriers Identified)  Coordination of Care -  Education Method Verbal;Teach-back  Support Groups/Services Friends and Family  Time Spent with Patient 30

## 2021-05-19 ENCOUNTER — Telehealth: Payer: Self-pay | Admitting: *Deleted

## 2021-05-19 ENCOUNTER — Other Ambulatory Visit: Payer: Self-pay | Admitting: *Deleted

## 2021-05-19 ENCOUNTER — Encounter: Payer: Self-pay | Admitting: *Deleted

## 2021-05-19 DIAGNOSIS — R918 Other nonspecific abnormal finding of lung field: Secondary | ICD-10-CM | POA: Diagnosis not present

## 2021-05-19 DIAGNOSIS — T7840XA Allergy, unspecified, initial encounter: Secondary | ICD-10-CM | POA: Diagnosis present

## 2021-05-19 DIAGNOSIS — J90 Pleural effusion, not elsewhere classified: Secondary | ICD-10-CM | POA: Diagnosis not present

## 2021-05-19 DIAGNOSIS — A419 Sepsis, unspecified organism: Secondary | ICD-10-CM | POA: Diagnosis not present

## 2021-05-19 DIAGNOSIS — R7981 Abnormal blood-gas level: Secondary | ICD-10-CM

## 2021-05-19 DIAGNOSIS — Y95 Nosocomial condition: Secondary | ICD-10-CM | POA: Diagnosis not present

## 2021-05-19 DIAGNOSIS — G2581 Restless legs syndrome: Secondary | ICD-10-CM | POA: Diagnosis present

## 2021-05-19 DIAGNOSIS — Z91048 Other nonmedicinal substance allergy status: Secondary | ICD-10-CM | POA: Diagnosis not present

## 2021-05-19 DIAGNOSIS — R7989 Other specified abnormal findings of blood chemistry: Secondary | ICD-10-CM | POA: Diagnosis not present

## 2021-05-19 DIAGNOSIS — R6520 Severe sepsis without septic shock: Secondary | ICD-10-CM | POA: Diagnosis not present

## 2021-05-19 DIAGNOSIS — Z20822 Contact with and (suspected) exposure to covid-19: Secondary | ICD-10-CM | POA: Diagnosis not present

## 2021-05-19 DIAGNOSIS — R0689 Other abnormalities of breathing: Secondary | ICD-10-CM | POA: Diagnosis not present

## 2021-05-19 DIAGNOSIS — J9601 Acute respiratory failure with hypoxia: Secondary | ICD-10-CM | POA: Diagnosis not present

## 2021-05-19 DIAGNOSIS — Z888 Allergy status to other drugs, medicaments and biological substances status: Secondary | ICD-10-CM | POA: Diagnosis not present

## 2021-05-19 DIAGNOSIS — C3411 Malignant neoplasm of upper lobe, right bronchus or lung: Secondary | ICD-10-CM | POA: Diagnosis not present

## 2021-05-19 DIAGNOSIS — Z880 Allergy status to penicillin: Secondary | ICD-10-CM | POA: Diagnosis not present

## 2021-05-19 DIAGNOSIS — R0602 Shortness of breath: Secondary | ICD-10-CM | POA: Diagnosis not present

## 2021-05-19 DIAGNOSIS — E785 Hyperlipidemia, unspecified: Secondary | ICD-10-CM | POA: Diagnosis present

## 2021-05-19 DIAGNOSIS — Z7952 Long term (current) use of systemic steroids: Secondary | ICD-10-CM | POA: Diagnosis not present

## 2021-05-19 DIAGNOSIS — Z79899 Other long term (current) drug therapy: Secondary | ICD-10-CM | POA: Diagnosis not present

## 2021-05-19 DIAGNOSIS — Z7982 Long term (current) use of aspirin: Secondary | ICD-10-CM | POA: Diagnosis not present

## 2021-05-19 DIAGNOSIS — C7931 Secondary malignant neoplasm of brain: Secondary | ICD-10-CM | POA: Diagnosis not present

## 2021-05-19 DIAGNOSIS — R0902 Hypoxemia: Secondary | ICD-10-CM | POA: Diagnosis not present

## 2021-05-19 DIAGNOSIS — Z515 Encounter for palliative care: Secondary | ICD-10-CM | POA: Diagnosis not present

## 2021-05-19 DIAGNOSIS — R339 Retention of urine, unspecified: Secondary | ICD-10-CM | POA: Diagnosis present

## 2021-05-19 DIAGNOSIS — I10 Essential (primary) hypertension: Secondary | ICD-10-CM | POA: Diagnosis not present

## 2021-05-19 DIAGNOSIS — J189 Pneumonia, unspecified organism: Secondary | ICD-10-CM | POA: Diagnosis not present

## 2021-05-19 DIAGNOSIS — Z881 Allergy status to other antibiotic agents status: Secondary | ICD-10-CM | POA: Diagnosis not present

## 2021-05-19 DIAGNOSIS — Z66 Do not resuscitate: Secondary | ICD-10-CM | POA: Diagnosis not present

## 2021-05-19 DIAGNOSIS — R509 Fever, unspecified: Secondary | ICD-10-CM | POA: Diagnosis not present

## 2021-05-19 NOTE — Telephone Encounter (Signed)
Per 05/17/21 los - called and lvm of upcoming appointment - requested callback to confirm

## 2021-05-19 NOTE — Progress Notes (Signed)
Received a call from [place] husband. He states she is having difficulty maintaining her oxygen saturation.   While patient is active, she is short of breath and begins to cough. Her oxygen saturation drops into the low 80's. (82-84%). Also, when she sleeps, she seems to have moments of "gasping" and her saturation again drops into the low 80s. When patient is awake, at rest, and can intentionally focus on breathing, her saturations increase, but only into the low 90s (90-92%). She also had a fall last evening with Richardson Landry and her sister present. No injuries noted.   Due to the above developments, Richardson Landry doesn't feel comfortable bringing Jocee to the beach this weekend. He feels like she is too unstable, and if something were to happen, being so far from home would be difficult. I encouraged him to trust his intuition, and while it would be good for patient to see her grandchildren, her respiratory instability means the risk of travel would outweigh any benefit.   Reviewed the above with Dr Marin Olp. Dr Marin Olp would like to get the patient home oxygen. Order given to the desk nurse.   Called to notify him of Dr Antonieta Pert agreement with his decision and that we will order home oxygen, however during the time between calls, Akilah's saturation had dropped into the 50's and they have call EMS. Patient lives in [place], so patient will likely be brought to a [facility] hospital, which can be accessed by Care Everywhere. Dr Marin Olp updated.   Oncology Nurse Navigator Documentation  Oncology Nurse Navigator Flowsheets 05/19/2021  Abnormal Finding Date -  Confirmed Diagnosis Date -  Diagnosis Status -  Phase of Treatment -  Chemotherapy Actual Start Date: -  Radiation Actual Start Date: -  Navigator Follow Up Date: 06/01/2021  Navigator Follow Up Reason: Follow-up Appointment  Navigator Location [facility]  Referral Date to RadOnc/MedOnc -  Navigator Encounter Type Telephone  Telephone  Patient Update;Symptom Mgt;Incoming Call  Treatment Initiated Date -  Patient Visit Type MedOnc  Treatment Phase Active Tx  Barriers/Navigation Needs Coordination of Care;Education  Education Other  Interventions Education;Psycho-Social Support;Coordination of Care  Acuity Level 2-Minimal Needs (1-2 Barriers Identified)  Coordination of Care Home Health  Education Method Verbal;Teach-back  Support Groups/Services Friends and Family  Time Spent with Patient 9

## 2021-05-20 ENCOUNTER — Encounter: Payer: Self-pay | Admitting: *Deleted

## 2021-05-20 NOTE — Progress Notes (Signed)
Patient admitted to [facility] under critical care management for Sepsis with acute respiratory failure due to bilateral pneumonia. Will continue to monitor for post discharge follow up.  Oncology Nurse Navigator Documentation  Oncology Nurse Navigator Flowsheets 05/20/2021  Abnormal Finding Date -  Confirmed Diagnosis Date -  Diagnosis Status -  Phase of Treatment -  Chemotherapy Actual Start Date: -  Radiation Actual Start Date: -  Navigator Follow Up Date: 06/01/2021  Navigator Follow Up Reason: Follow-up Appointment  Navigator Location [facility]  Referral Date to RadOnc/MedOnc -  Navigator Encounter Type Appt/Treatment Plan Review  Telephone -  Treatment Initiated Date -  Patient Visit Type MedOnc  Treatment Phase Active Tx  Barriers/Navigation Needs Coordination of Care;Education  Education -  Interventions None Required  Acuity Level 2-Minimal Needs (1-2 Barriers Identified)  Coordination of Care -  Education Method -  Support Groups/Services Friends and Family  Time Spent with Patient 15

## 2021-05-23 ENCOUNTER — Telehealth: Payer: Self-pay | Admitting: Radiology

## 2021-05-23 NOTE — Telephone Encounter (Addendum)
A PHASE III TRIAL OF STEREOTACTIC RADIOSURGERY COMPARED WITH HIPPOCAMPAL-AVOIDANT WHOLE BRAIN RADIOTHERAPY (HA-WBRT) PLUS MEMANTINE FOR 5-15 BRAIN METASTASES   05/23/2021   10:20AM  PHONE CALL: Spoke with Heidi Mclaughlin husband Heidi Mclaughlin. He called to inform me of patient's passing over the weekend. Expressed my most sincere condolences for him and his family. Heidi Mclaughlin for the call and update and informed him that I would be available for anything he may need in the future. We discussed what a wonderful person Heidi Mclaughlin was and how she will be dearly missed by so many. He has a lot of love and support around him during this difficult time. Heidi Mclaughlin was unsure what to do with unused Memantine. Told Heidi Mclaughlin it was okay to destroy rest of medication and if possible, to send Memantine diary back to this coordinator. He stated that he was ensuring she was taking her medication, but he did miss documenting some days due to patients overall health. Expressed my understanding and appreciation to him. Informed him it was not top priority to return those diaries and we could find a later time to discuss the return of them.  This coordinator reached out to Heidi Mclaughlin office to inform them of patient passing as well as the radiation team. Heidi Mclaughlin is out of office today, but has been notified.   SAE: Records were requested from [facility] by Heidi Mclaughlin, Clinical Research Specialist. At this time, we are awaiting more records for entire hospital stay. Currently, records have been received for 05/19/2021. Began process in [place] for SAE. Currently states that it does not meet criteria for expedited reporting, but this may change depending on Heidi Mclaughlin attribution that will be obtained tomorrow (7/12) morning. If Heidi Mclaughlin feels this death is due to WBRT or Memantine, it will be considered for expedited reporting. If Heidi Mclaughlin feels that it is not due to either of these, then it would be considered as an SAE w/out expedited reporting  and reported to the IRB as well. Study is aware of potential SAE.   Heidi Mclaughlin, RT(R)(T) Clinical Research Coordinator

## 2021-05-24 ENCOUNTER — Encounter: Payer: Self-pay | Admitting: *Deleted

## 2021-05-24 ENCOUNTER — Encounter: Payer: Self-pay | Admitting: Radiology

## 2021-05-24 NOTE — Progress Notes (Signed)
A PHASE III TRIAL OF STEREOTACTIC RADIOSURGERY COMPARED WITH HIPPOCAMPAL-AVOIDANT WHOLE BRAIN RADIOTHERAPY (HA-WBRT) PLUS MEMANTINE FOR 5-15 BRAIN METASTASES  05/24/2021  10:55AM  NOTE TO FILE: This coordinator was notified of death for patient on Jun 18, 2021 (date of death was Jun 17, 2021). This coordinator began to report this event as an expedited SAE immediately. Provider was not available for attribution on Jun 18, 2021, so the study was contacted to inform them an SAE had begun, but not completed at this time. The provider was contacted this morning (05/24/2021) to confirm attribution to protocol treatment. The provider verified this grade 5 event was not attributable to any protocol related therapy. Study was notified of this decision and they verified no expedited reporting is necessary for this event. Follow-up data information was updated in [facility]. A "Death Report Folder" was also added and information was submitted. [facility] was contacted for all progress notes including time of death information, but they called and stated this information is not yet available. Will continue to follow-up to collect all necessary information and this death will be reported to the IRB.   Carol Ada, RT(R)(T) Clinical Research Coordinator

## 2021-05-24 NOTE — Progress Notes (Signed)
Patient passed away on May 30, 2021. Sympathy card sent.   Oncology Nurse Navigator Documentation  Oncology Nurse Navigator Flowsheets 05/24/2021  Abnormal Finding Date -  Confirmed Diagnosis Date -  Diagnosis Status -  Phase of Treatment -  Chemotherapy Actual Start Date: -  Radiation Actual Start Date: -  Navigator Follow Up Date: -  Navigator Follow Up Reason: -  Navigation Complete Date: 05/24/2021  Post Navigation: Continue to Follow Patient? No  Reason Not Navigating Patient: Hospice/Death  Navigator Location [facility]  Referral Date to RadOnc/MedOnc -  Navigator Encounter Type -  Telephone -  Treatment Initiated Date -  Patient Visit Type -  Treatment Phase -  Barriers/Navigation Needs -  Education -  Interventions -  Acuity -  Coordination of Care -  Education Method -  Support Groups/Services -  Time Spent with Patient 30

## 2021-05-27 ENCOUNTER — Other Ambulatory Visit (HOSPITAL_COMMUNITY): Payer: Self-pay

## 2021-06-01 ENCOUNTER — Ambulatory Visit: Payer: Medicare Other

## 2021-06-01 ENCOUNTER — Ambulatory Visit: Payer: Medicare Other | Admitting: Hematology & Oncology

## 2021-06-01 ENCOUNTER — Other Ambulatory Visit: Payer: Medicare Other

## 2021-06-13 DEATH — deceased

## 2021-06-16 ENCOUNTER — Other Ambulatory Visit: Payer: Medicare Other

## 2021-06-21 ENCOUNTER — Ambulatory Visit: Payer: Self-pay | Admitting: Radiation Oncology

## 2021-06-21 ENCOUNTER — Encounter: Payer: Medicare Other | Admitting: Radiology

## 2021-06-21 ENCOUNTER — Other Ambulatory Visit: Payer: Medicare Other

## 2021-08-18 ENCOUNTER — Other Ambulatory Visit: Payer: Medicare Other

## 2021-08-23 ENCOUNTER — Ambulatory Visit: Payer: Self-pay | Admitting: Radiation Oncology

## 2021-10-20 ENCOUNTER — Other Ambulatory Visit: Payer: Medicare Other

## 2021-10-25 ENCOUNTER — Ambulatory Visit: Payer: Self-pay | Admitting: Radiation Oncology

## 2022-01-19 ENCOUNTER — Other Ambulatory Visit: Payer: Medicare Other

## 2022-01-24 ENCOUNTER — Ambulatory Visit: Payer: Self-pay | Admitting: Radiation Oncology

## 2022-04-20 ENCOUNTER — Other Ambulatory Visit: Payer: Medicare Other

## 2022-04-25 ENCOUNTER — Ambulatory Visit: Payer: Self-pay | Admitting: Radiation Oncology

## 2022-08-22 ENCOUNTER — Ambulatory Visit: Payer: Self-pay | Admitting: Radiation Oncology

## 2022-12-25 IMAGING — MR MR HEAD WO/W CM
12 series · 48 of 48 positions shown · IV contrast (multihance)
Comparison: MRI head without and with contrast 03/26/2021 at
[facility].

CLINICAL DATA: Non-small cell lung cancer. Known brain metastases.

EXAM:
MRI HEAD WITHOUT AND WITH CONTRAST
TECHNIQUE: Multiplanar, multiecho pulse sequences of the brain and surrounding
structures were obtained without and with intravenous contrast.
CONTRAST:  15mL MULTIHANCE GADOBENATE DIMEGLUMINE 529 MG/ML IV SOLN

[Series 2: FLAIR · sagittal · 3.0mm · 0.75mm/px · 3 of 39 slices shown (1 of 2)]
[im 1/39]
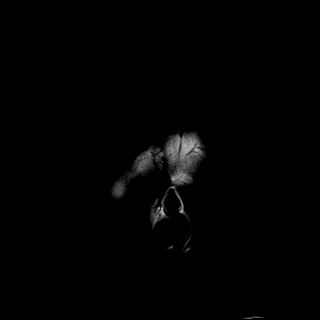
[im 20/39]
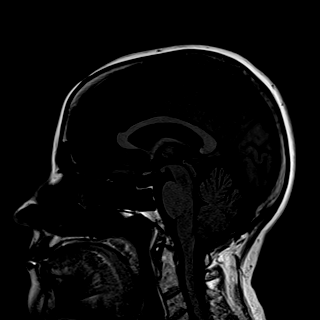
[im 39/39]
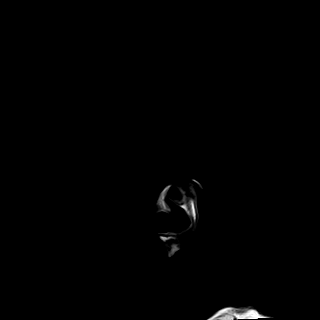

[Series 3: DWI · axial · 3.0mm · 1.50mm/px · z∈[-69,+79]mm · 4 of 78 slices shown (1 of 2)]
[im 1/78]
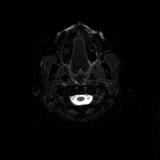
[im 26/78]
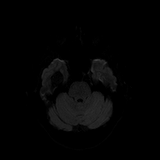
[im 52/78]
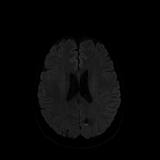
[im 78/78]
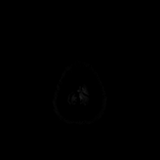

[Series 4: DWI · axial · 3.0mm · 1.50mm/px · z∈[-69,+79]mm · 2 of 39 slices shown (2 of 2)]
[im 1/39]
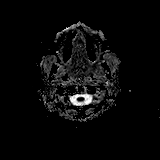
[im 39/39]
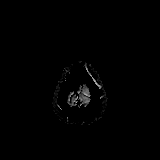

[Series 5: T2 · axial · 5.0mm · 0.86mm/px · 1 of 27 slices shown (1 of 2)]
[im 1/27]
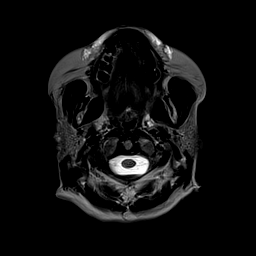

[Series 6: swi_images · axial · 1.5mm · 0.90mm/px · z∈[-71,+72]mm · 5 of 96 slices shown]
[im 1/96]
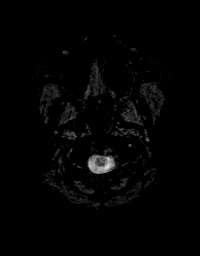
[im 24/96]
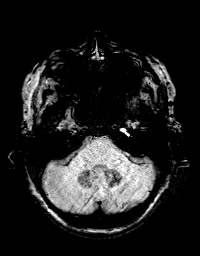
[im 48/96]
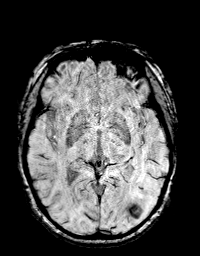
[im 72/96]
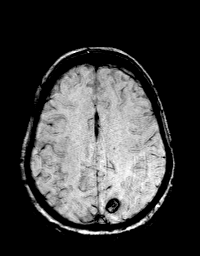
[im 96/96]
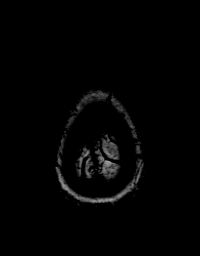

[Series 8: FLAIR · axial · 3.0mm · 0.86mm/px · z∈[-86,+79]mm · 3 of 56 slices shown (2 of 2)]
[im 1/56]
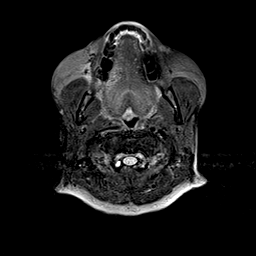
[im 28/56]
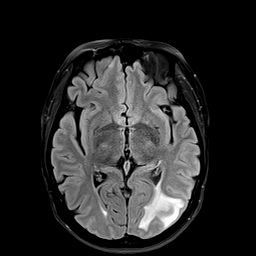
[im 56/56]
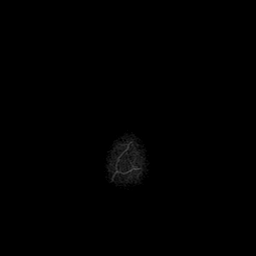

[Series 9: T2 · axial · non-contrast · 1.0mm · 0.86mm/px · z∈[-81,+75]mm · 8 of 160 slices shown (2 of 2)]
[im 1/160]
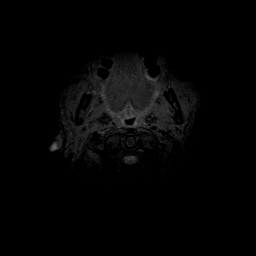
[im 23/160]
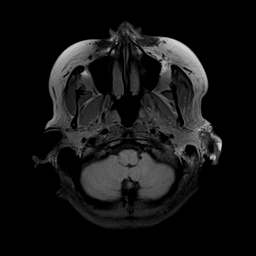
[im 46/160]
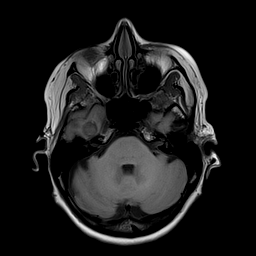
[im 69/160]
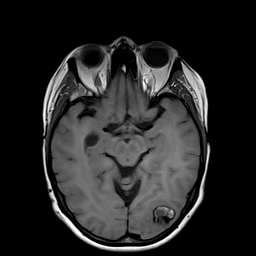
[im 91/160]
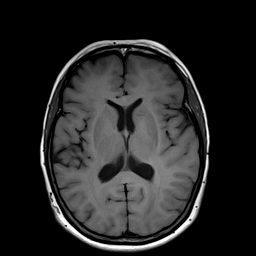
[im 114/160]
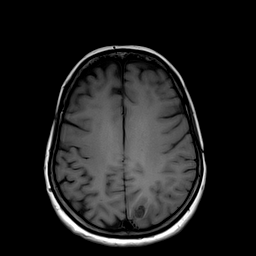
[im 137/160]
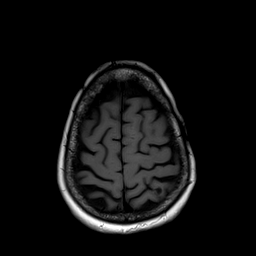
[im 160/160]
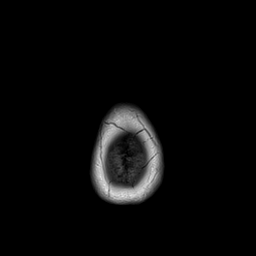

[Series 10: T2 post-contrast · coronal · 3.0mm · 0.86mm/px · 2 of 47 slices shown (1 of 2)]
[im 1/47]
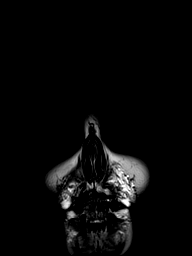
[im 47/47]
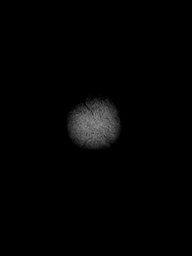

[Series 11: T2 post-contrast · axial · 1.0mm · 0.86mm/px · z∈[-81,+75]mm · 8 of 160 slices shown (2 of 2)]
[im 1/160]
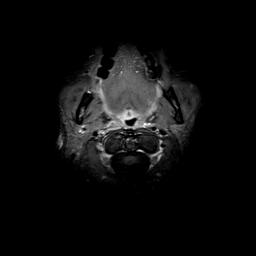
[im 23/160]
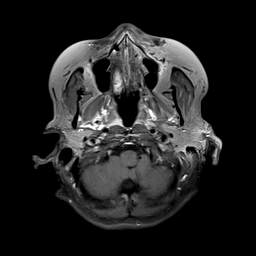
[im 46/160]
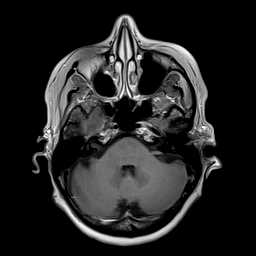
[im 69/160]
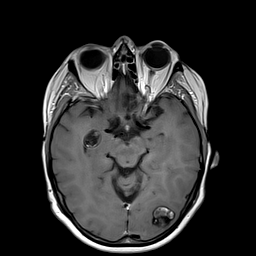
[im 91/160]
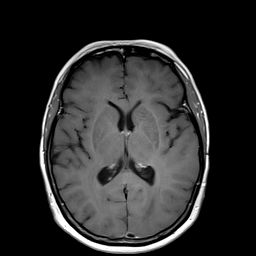
[im 114/160]
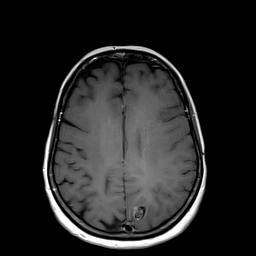
[im 137/160]
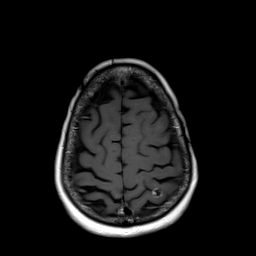
[im 160/160]
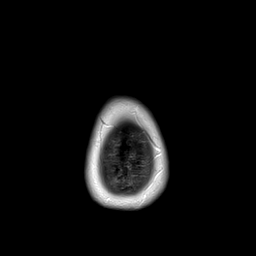

[Series 12: T1 post-contrast · axial · 1.0mm · 0.75mm/px · z∈[-83,+76]mm · 8 of 160 slices shown (1 of 2)]
[im 1/160]
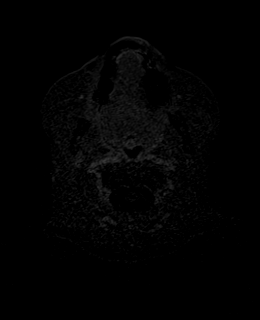
[im 23/160]
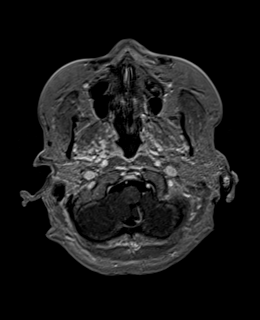
[im 46/160]
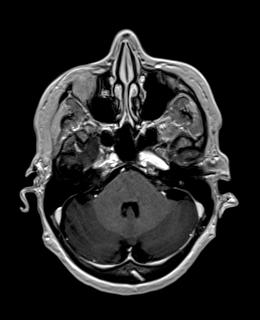
[im 69/160]
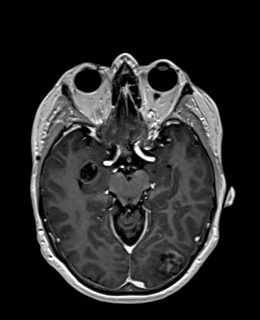
[im 91/160]
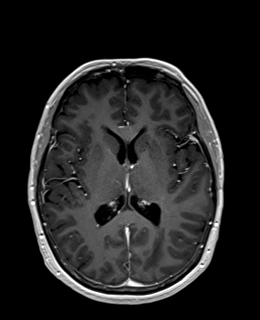
[im 114/160]
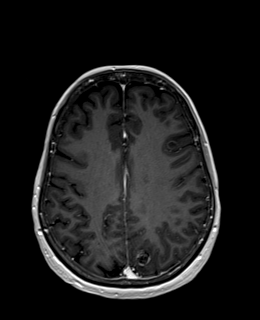
[im 137/160]
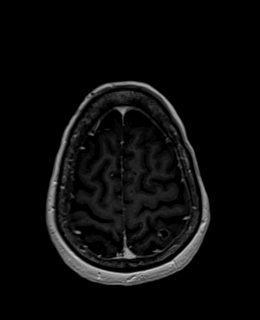
[im 160/160]
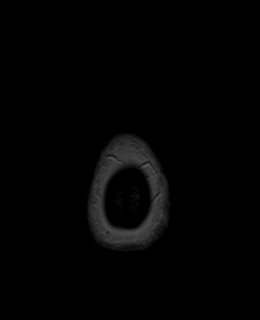

[Series 13: T1 post-contrast · coronal · 3.0mm · 0.86mm/px · 2 of 47 slices shown (2 of 2)]
[im 1/47]
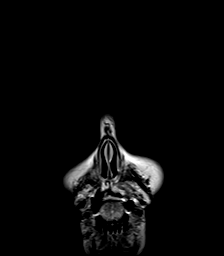
[im 47/47]
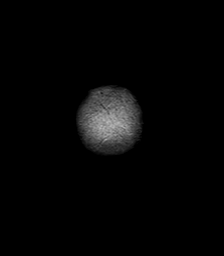

[Series 14: FLAIR post-contrast · sagittal · 3.0mm · 0.94mm/px · 2 of 39 slices shown]
[im 1/39]
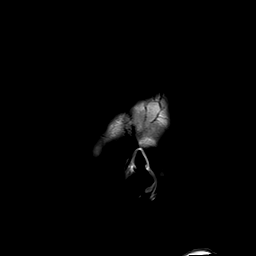
[im 39/39]
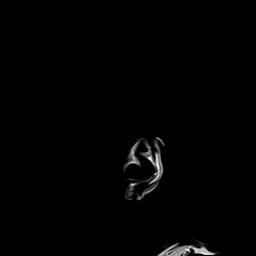

[48 of 48 positions shown; findings below may reference images not displayed]

FINDINGS: BRAIN

Four additional lesions are identified. All measurements are given
from the axial plane, series [DATE]. Punctate lesion present on image 145 in the left frontal lobe.
2. 3 mm left parietal lesion on image 120
3. Punctate lesion in the left temporal lobe on image 92
4. 7 mm lesion in the left temporal lobe on image 160

The other 10 lesions are all confirmed. Measurements of several
lesions are slightly larger than on the previous study, likely
secondary to resolution differences at [facility].

1. 3 mm lesion in the right occipital lobe on image 29
2. 2.4 cm lesion in the right temporal lobe on image 58 with stable
appearance of hemorrhage in surrounding edema.
3. 21 mm lesion in the left occipital lobe on image 63. This lesion
was underestimated in size on the prior exam. Intrinsic hemorrhage
in surrounding edema is stable. This lesion exhibits the most
extensive edema.
4. Left occipital lesion on image 72 is confirmed from the prior
exam.
5. Right occipital lesion on image 100 measures 7 mm.
6. Right caudate lesion on image 100 measures 6 mm.
7. Right frontal lobe lesion on image 103 is confirmed, measuring 3
mm.
8. Medial left parietal lobe lesion on image 109 measures 16 mm.
Intrinsic hemorrhage in surrounding edema is similar the prior exam.
9. Medial left parietal lesion on image 125 measures 12 mm.
Intrinsic hemorrhage and surrounding edema is better appreciated on
today's exam.
10. Left parietal lesion on image 139 measures 9 mm. Intrinsic
hemorrhage and surrounding edema is better appreciated on today's
exam.

Other Brain findings: No acute infarct is present. Susceptibility on
diffusion-weighted images is noted in association with the
hemorrhagic lesions. The ventricles are of normal size. No
significant extraaxial fluid collection is present. The internal
auditory canals are within normal limits. The brainstem is within
normal limits.

Vascular: Flow is present in the major intracranial arteries.
Basilar artery is small with fetal type posterior cerebral arteries.

Skull and upper cervical spine: The craniocervical junction is
normal. Upper cervical spine is within normal limits. Marrow signal
is unremarkable.

Sinuses/Orbits: The paranasal sinuses and mastoid air cells are
clear. The globes and orbits are within normal limits.
IMPRESSION: 1. Four additional lesions are identified as described. Total number
of lesions is 14.
2. Measurements of several lesions are slightly larger than on the
prior exam, likely secondary to resolution differences at [facility].
3. At least 5 of the lesions demonstrate evidence of hemorrhage.
There is no significant change in the hemorrhage in any lesion.
4. Surrounding edema, most evident in the left occipital and
parietal lesions, is similar to the prior exam.

## 2023-04-24 ENCOUNTER — Ambulatory Visit: Payer: Self-pay | Admitting: Radiation Oncology
